# Patient Record
Sex: Male | Born: 1950
Health system: Southern US, Community
[De-identification: ages and names within clinical notes are randomized; demographics above are authoritative.]

## PROBLEM LIST (undated history)

## (undated) DIAGNOSIS — Q213 Tetralogy of Fallot: Secondary | ICD-10-CM

## (undated) DIAGNOSIS — M1990 Unspecified osteoarthritis, unspecified site: Secondary | ICD-10-CM

## (undated) DIAGNOSIS — K219 Gastro-esophageal reflux disease without esophagitis: Secondary | ICD-10-CM

## (undated) DIAGNOSIS — I10 Essential (primary) hypertension: Secondary | ICD-10-CM

## (undated) DIAGNOSIS — Z5189 Encounter for other specified aftercare: Secondary | ICD-10-CM

## (undated) DIAGNOSIS — Z136 Encounter for screening for cardiovascular disorders: Secondary | ICD-10-CM

## (undated) DIAGNOSIS — R011 Cardiac murmur, unspecified: Secondary | ICD-10-CM

## (undated) DIAGNOSIS — E785 Hyperlipidemia, unspecified: Secondary | ICD-10-CM

## (undated) DIAGNOSIS — K429 Umbilical hernia without obstruction or gangrene: Secondary | ICD-10-CM

## (undated) DIAGNOSIS — J449 Chronic obstructive pulmonary disease, unspecified: Secondary | ICD-10-CM

## (undated) DIAGNOSIS — E7800 Pure hypercholesterolemia, unspecified: Secondary | ICD-10-CM

## (undated) DIAGNOSIS — H269 Unspecified cataract: Secondary | ICD-10-CM

## (undated) DIAGNOSIS — N529 Male erectile dysfunction, unspecified: Secondary | ICD-10-CM

## (undated) DIAGNOSIS — J441 Chronic obstructive pulmonary disease with (acute) exacerbation: Secondary | ICD-10-CM

## (undated) DIAGNOSIS — G4730 Sleep apnea, unspecified: Secondary | ICD-10-CM

## (undated) DIAGNOSIS — T7840XA Allergy, unspecified, initial encounter: Secondary | ICD-10-CM

## (undated) DIAGNOSIS — K4090 Unilateral inguinal hernia, without obstruction or gangrene, not specified as recurrent: Secondary | ICD-10-CM

## (undated) DIAGNOSIS — R002 Palpitations: Secondary | ICD-10-CM

## (undated) DIAGNOSIS — I712 Thoracic aortic aneurysm, without rupture, unspecified: Secondary | ICD-10-CM

## (undated) DIAGNOSIS — G4733 Obstructive sleep apnea (adult) (pediatric): Secondary | ICD-10-CM

## (undated) DIAGNOSIS — I714 Abdominal aortic aneurysm, without rupture: Secondary | ICD-10-CM

## (undated) HISTORY — DX: Male erectile dysfunction, unspecified: N52.9

## (undated) HISTORY — DX: Essential (primary) hypertension: I10

## (undated) HISTORY — PX: TONSILLECTOMY: SUR1361

## (undated) HISTORY — DX: Encounter for other specified aftercare: Z51.89

## (undated) HISTORY — DX: Allergy, unspecified, initial encounter: T78.40XA

## (undated) HISTORY — DX: Unspecified cataract: H26.9

## (undated) HISTORY — DX: Obstructive sleep apnea (adult) (pediatric): G47.33

## (undated) HISTORY — PX: CARDIAC CATHETERIZATION: SHX172

## (undated) HISTORY — DX: Hyperlipidemia, unspecified: E78.5

## (undated) HISTORY — PX: BRAIN SURGERY: SHX531

## (undated) HISTORY — DX: Palpitations: R00.2

## (undated) HISTORY — PX: JOINT REPLACEMENT: SHX530

## (undated) HISTORY — DX: Unilateral inguinal hernia, without obstruction or gangrene, not specified as recurrent: K40.90

## (undated) HISTORY — DX: Sleep apnea, unspecified: G47.30

## (undated) HISTORY — DX: Gastro-esophageal reflux disease without esophagitis: K21.9

## (undated) HISTORY — PX: EYE SURGERY: SHX253

## (undated) HISTORY — PX: HERNIA REPAIR: SHX51

## (undated) HISTORY — DX: Chronic obstructive pulmonary disease with (acute) exacerbation: J44.1

## (undated) HISTORY — DX: Encounter for screening for cardiovascular disorders: Z13.6

## (undated) HISTORY — DX: Umbilical hernia without obstruction or gangrene: K42.9

---

## 1972-02-07 HISTORY — PX: TETRALOGY OF FALLOT REPAIR: SHX796

## 2003-02-07 DIAGNOSIS — Z136 Encounter for screening for cardiovascular disorders: Secondary | ICD-10-CM

## 2003-02-07 HISTORY — DX: Encounter for screening for cardiovascular disorders: Z13.6

## 2006-02-12 ENCOUNTER — Ambulatory Visit: Payer: Self-pay | Admitting: Unknown Physician Specialty

## 2009-07-20 ENCOUNTER — Ambulatory Visit: Payer: Self-pay | Admitting: Specialist

## 2010-01-10 ENCOUNTER — Ambulatory Visit: Payer: Self-pay | Admitting: Unknown Physician Specialty

## 2010-01-13 LAB — PATHOLOGY REPORT

## 2010-02-06 DIAGNOSIS — G4733 Obstructive sleep apnea (adult) (pediatric): Secondary | ICD-10-CM

## 2010-02-06 HISTORY — DX: Obstructive sleep apnea (adult) (pediatric): G47.33

## 2011-05-26 ENCOUNTER — Ambulatory Visit (INDEPENDENT_AMBULATORY_CARE_PROVIDER_SITE_OTHER)
Admission: RE | Admit: 2011-05-26 | Discharge: 2011-05-26 | Disposition: A | Discharge status: Home / Self Care | Payer: 59 | Source: Ambulatory Visit | Attending: Internal Medicine | Admitting: Internal Medicine

## 2011-05-26 ENCOUNTER — Ambulatory Visit (INDEPENDENT_AMBULATORY_CARE_PROVIDER_SITE_OTHER): Payer: 59 | Admitting: Internal Medicine

## 2011-05-26 ENCOUNTER — Encounter: Payer: Self-pay | Admitting: Internal Medicine

## 2011-05-26 DIAGNOSIS — M25519 Pain in unspecified shoulder: Secondary | ICD-10-CM

## 2011-05-26 DIAGNOSIS — G4733 Obstructive sleep apnea (adult) (pediatric): Secondary | ICD-10-CM | POA: Insufficient documentation

## 2011-05-26 DIAGNOSIS — M25512 Pain in left shoulder: Secondary | ICD-10-CM

## 2011-05-26 DIAGNOSIS — J449 Chronic obstructive pulmonary disease, unspecified: Secondary | ICD-10-CM | POA: Insufficient documentation

## 2011-05-26 DIAGNOSIS — E785 Hyperlipidemia, unspecified: Secondary | ICD-10-CM

## 2011-05-26 DIAGNOSIS — G8929 Other chronic pain: Secondary | ICD-10-CM | POA: Insufficient documentation

## 2011-05-26 DIAGNOSIS — K219 Gastro-esophageal reflux disease without esophagitis: Secondary | ICD-10-CM | POA: Insufficient documentation

## 2011-05-26 DIAGNOSIS — J45909 Unspecified asthma, uncomplicated: Secondary | ICD-10-CM

## 2011-05-26 DIAGNOSIS — Z136 Encounter for screening for cardiovascular disorders: Secondary | ICD-10-CM | POA: Insufficient documentation

## 2011-05-26 MED ORDER — TRAMADOL HCL 50 MG PO TABS: 50.0000 mg | ORAL_TABLET | Freq: Three times a day (TID) | ORAL | Status: AC | PRN

## 2011-05-26 MED ORDER — LEVALBUTEROL HCL 0.63 MG/3ML IN NEBU: 1.0000 | INHALATION_SOLUTION | RESPIRATORY_TRACT | Status: DC | PRN

## 2011-05-26 MED ORDER — ALBUTEROL SULFATE (2.5 MG/3ML) 0.083% IN NEBU: 2.5000 mg | INHALATION_SOLUTION | Freq: Four times a day (QID) | RESPIRATORY_TRACT | Status: DC | PRN

## 2011-05-26 NOTE — Patient Instructions (Signed)
I am ordering plain films of cervicla spine and left shoulder to rule out disk disease and spur formatio as causes of your pain .  You may combine tramadol with ibuprofen for pain relief.

## 2011-05-26 NOTE — Progress Notes (Signed)
Patient ID: Charles Mccann, male   DOB: Nov 15, 1950, 61 y.o.   MRN: [id number]  Patient Active Problem List  Diagnoses  . Asthma  . Treadmill stress test negative for angina pectoris  . GERD (gastroesophageal reflux disease)  . Obstructive sleep apnea of adult  . Chronic left shoulder pain  . Hyperlipidemia LDL goal < 100    Subjective:  CC:   Chief Complaint  Patient presents with  . New Patient    HPI:   Charles Mccann a 61 y.o. male who presents to establish primary care,  Transferring from [facility].  He has brought his recent communication from [facility] regarding his fasting lipids in February which were normal.  Not sure when his last anunual was done but PSA was low. He has a history of Tetralaogy of Fallot, which was repaired at age 36 by Rachel Bo,  Cardiothoracic surgeon who was previously at [facility].  He has not had any cardiology since then.  He has a history of asthma.  His chief complaint today is left shoulder pain which radiates to his left hand.  It has been present for one year. Aggravated by internal rotation and abduction above his head.  No history of trauma .     Past Medical History  Diagnosis Date  . Asthma     managed by Meredeth Ide  . Treadmill stress test negative for angina pectoris 2005  . GERD (gastroesophageal reflux disease)     controlled only with nexium priro prevacid failutre  . Hypertension   . Obstructive sleep apnea of adult 2012    on CPAP  tolerating ,  12 cm H20 , room air     History reviewed. No pertinent past surgical history.       The following portions of the patient's history were reviewed and updated as appropriate: Allergies, current medications, and problem list.    Review of Systems:   12 Pt  review of systems was negative except those addressed in the HPI,     History   Social History  . Marital Status: Married    Spouse Name: N/A    Number of Children: N/A  . Years of Education: N/A   Occupational  History  . 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 Smoking status: Never Smoker   . Smokeless tobacco: Never Used  . Alcohol Use: Yes  . Drug Use: No  . Sexually Active: Not on file   Other Topics Concern  . Not on file   Social History Narrative  . No narrative on file    Objective:  BP 136/84  Pulse 65  Temp(Src) 98.2 F (36.8 C) (Oral)  Resp 16  Ht 5\' 11"  (1.803 m)  Wt 219 lb 4 oz (99.451 kg)  BMI 30.58 kg/m2  SpO2 96%  General appearance: alert, cooperative and appears stated age Ears: normal TM's and external ear canals both ears Throat: lips, mucosa, and tongue normal; teeth and gums normal Neck: no adenopathy, no carotid bruit, supple, symmetrical, trachea midline and thyroid not enlarged, symmetric, no tenderness/mass/nodules Back: symmetric, no curvature. ROM normal. No CVA tenderness. Lungs: clear to auscultation bilaterally Heart: regular rate and rhythm, S1, S2 normal, no murmur, click, rub or gallop Abdomen: soft, non-tender; bowel sounds normal; no masses,  no organomegaly Pulses: 2+ and symmetric Skin: Skin color, texture, turgor normal. No rashes or lesions Lymph nodes: Cervical, supraclavicular, and axillary nodes  normal.  Assessment and Plan:  Chronic left shoulder pain likely rotator cuff or bone spur. Plain films  Ordered.   GERD (gastroesophageal reflux disease) controlled only with nexium prior prevacid failure  Hyperlipidemia LDL goal < 100 Managed with low dose lipitor,  LDL recently was 86.     Updated Medication List Outpatient Encounter Prescriptions as of 05/26/2011  Medication Sig Dispense Refill  . albuterol (PROVENTIL HFA;VENTOLIN HFA) 108 (90 BASE) MCG/ACT inhaler Inhale 2 puffs into the lungs every 6 (six) hours as needed.      Marland Kitchen albuterol (PROVENTIL) (2.5 MG/3ML) 0.083% nebulizer solution Take 3 mLs (2.5 mg total) by nebulization every 6 (six) hours as needed for wheezing.  75 mL  12  .  atorvastatin (LIPITOR) 10 MG tablet Take 10 mg by mouth daily.      Marland Kitchen esomeprazole (NEXIUM) 40 MG capsule Take 40 mg by mouth daily before breakfast.      . fluticasone (FLONASE) 50 MCG/ACT nasal spray Place 2 sprays into the nose daily.      . Fluticasone-Salmeterol (ADVAIR) 250-50 MCG/DOSE AEPB Inhale 1 puff into the lungs every 12 (twelve) hours.      Marland Kitchen levalbuterol (XOPENEX) 0.63 MG/3ML nebulizer solution Take 3 mLs (0.63 mg total) by nebulization every 4 (four) hours as needed for wheezing.  3 mL  12  . montelukast (SINGULAIR) 10 MG tablet Take 10 mg by mouth at bedtime.      . sildenafil (VIAGRA) 100 MG tablet Take 100 mg by mouth daily as needed.      . traMADol (ULTRAM) 50 MG tablet Take 1 tablet (50 mg total) by mouth every 8 (eight) hours as needed for pain.  120 tablet  0     Orders Placed This Encounter  Procedures  . DG Cervical Spine Complete  . DG Shoulder Left  . HM COLONOSCOPY    Return in about 6 months (around 11/25/2011).

## 2011-05-26 NOTE — Assessment & Plan Note (Addendum)
likely rotator cuff or bone spur. Plain films  Ordered.

## 2011-05-28 ENCOUNTER — Encounter: Payer: Self-pay | Admitting: Internal Medicine

## 2011-05-28 DIAGNOSIS — E785 Hyperlipidemia, unspecified: Secondary | ICD-10-CM

## 2011-05-28 HISTORY — DX: Hyperlipidemia, unspecified: E78.5

## 2011-05-28 NOTE — Assessment & Plan Note (Signed)
Managed with low dose lipitor,  LDL recently was 86.

## 2011-05-28 NOTE — Assessment & Plan Note (Signed)
controlled only with nexium prior prevacid failure

## 2011-05-30 ENCOUNTER — Encounter: Payer: Self-pay | Admitting: *Deleted

## 2011-07-14 ENCOUNTER — Other Ambulatory Visit: Payer: Self-pay | Admitting: *Deleted

## 2011-07-14 ENCOUNTER — Encounter: Payer: Self-pay | Admitting: Internal Medicine

## 2011-07-14 MED ORDER — SILDENAFIL CITRATE 100 MG PO TABS: 100.0000 mg | ORAL_TABLET | Freq: Every day | ORAL | Status: DC | PRN

## 2011-08-31 ENCOUNTER — Telehealth: Payer: Self-pay | Admitting: Internal Medicine

## 2011-08-31 MED ORDER — PREDNISONE (PAK) 10 MG PO TABS: ORAL_TABLET | ORAL | Status: DC

## 2011-08-31 MED ORDER — LEVOFLOXACIN 500 MG PO TABS: 500.0000 mg | ORAL_TABLET | Freq: Every day | ORAL | Status: AC

## 2011-08-31 NOTE — Telephone Encounter (Signed)
Patient called and stated he is an asthma patient and normally when he has a sinus infection Dr. Meredeth Ide will call in an antibiotic and prednisone but Dr. Meredeth Ide is out of town.  He stated he is congested, productive cough x 10 days no fever.  Per Dr. Darrick Huntsman a prednisone taper and antibiotic have been called.

## 2011-10-18 ENCOUNTER — Encounter: Payer: Self-pay | Admitting: Internal Medicine

## 2011-10-20 ENCOUNTER — Ambulatory Visit: Payer: 59 | Admitting: Internal Medicine

## 2011-11-27 ENCOUNTER — Encounter: Payer: Self-pay | Admitting: Internal Medicine

## 2011-11-27 ENCOUNTER — Ambulatory Visit (INDEPENDENT_AMBULATORY_CARE_PROVIDER_SITE_OTHER): Payer: 59 | Admitting: Internal Medicine

## 2011-11-27 VITALS — BP 122/78 | HR 62 | Temp 98.2°F | Ht 71.0 in | Wt 219.5 lb

## 2011-11-27 DIAGNOSIS — I493 Ventricular premature depolarization: Secondary | ICD-10-CM

## 2011-11-27 DIAGNOSIS — Z23 Encounter for immunization: Secondary | ICD-10-CM

## 2011-11-27 DIAGNOSIS — R002 Palpitations: Secondary | ICD-10-CM

## 2011-11-27 DIAGNOSIS — T148XXA Other injury of unspecified body region, initial encounter: Secondary | ICD-10-CM | POA: Insufficient documentation

## 2011-11-27 DIAGNOSIS — Z13220 Encounter for screening for lipoid disorders: Secondary | ICD-10-CM

## 2011-11-27 DIAGNOSIS — M180 Bilateral primary osteoarthritis of first carpometacarpal joints: Secondary | ICD-10-CM

## 2011-11-27 DIAGNOSIS — R5381 Other malaise: Secondary | ICD-10-CM

## 2011-11-27 DIAGNOSIS — R5383 Other fatigue: Secondary | ICD-10-CM

## 2011-11-27 DIAGNOSIS — I4949 Other premature depolarization: Secondary | ICD-10-CM

## 2011-11-27 DIAGNOSIS — M19049 Primary osteoarthritis, unspecified hand: Secondary | ICD-10-CM

## 2011-11-27 DIAGNOSIS — Z79899 Other long term (current) drug therapy: Secondary | ICD-10-CM

## 2011-11-27 HISTORY — DX: Palpitations: R00.2

## 2011-11-27 LAB — LDL CHOLESTEROL, DIRECT: Direct LDL: 73.6 mg/dL

## 2011-11-27 LAB — LIPID PANEL
Cholesterol: 133 mg/dL (ref 0–200)
HDL: 42.6 mg/dL (ref >39.00)
LDL Cholesterol: 81 mg/dL (ref 0–99)
VLDL: 9.8 mg/dL (ref 0.0–40.0)

## 2011-11-27 LAB — COMPREHENSIVE METABOLIC PANEL
ALT: 21 U/L (ref 0–53)
AST: 18 U/L (ref 0–37)
Albumin: 3.9 g/dL (ref 3.5–5.2)
BUN: 20 mg/dL (ref 6–23)
CO2: 29 mEq/L (ref 19–32)
Calcium: 8.7 mg/dL (ref 8.4–10.5)
Chloride: 106 mEq/L (ref 96–112)
Potassium: 4 mEq/L (ref 3.5–5.1)

## 2011-11-27 MED ORDER — ZOSTER VACCINE LIVE 19400 UNT/0.65ML ~~LOC~~ SOLR: 0.6500 mL | Freq: Once | SUBCUTANEOUS | Status: DC

## 2011-11-27 NOTE — Patient Instructions (Addendum)
You can take up to 4 tylenol ( 2000 mg total) safely for wrist/thumb pain   Ok to combine it with alleve for improved pain management.   Keep your leg scab covered to see if it will heal.    Return in 6 months for your annual physical

## 2011-11-27 NOTE — Assessment & Plan Note (Signed)
No muscle wasting seen,  No synovitis or loss of strength.  Conservative treatment.

## 2011-11-27 NOTE — Assessment & Plan Note (Signed)
Prolonged auscultation reveals PVC's.  Checking lytes, thyroid ,  reassurance provided,  limitation of caffeine

## 2011-11-27 NOTE — Assessment & Plan Note (Signed)
Appears to have an acquired right buttock deformity secondary to contusion followed by necrosis.

## 2011-11-27 NOTE — Progress Notes (Signed)
Patient ID: Charles Mccann, male   DOB: Nov 22, 1950, 61 y.o.   MRN: [id number]  Patient Active Problem List  Diagnosis  . Asthma  . Treadmill stress test negative for angina pectoris  . GERD (gastroesophageal reflux disease)  . Obstructive sleep apnea of adult  . Hyperlipidemia LDL goal < 100  . Primary osteoarthritis of both first carpometacarpal joints  . Muscle contusion    Subjective:  CC:   Chief Complaint  Patient presents with  . Follow-up    HPI:  Charles Mccann a 61 y.o. male who presents for follow up on acute and chronic complaints.  Several new complaints.  1) His shoulder pain has improved with glucosamine. Having difficulty picking picking things up.  3) episodes of skipped heartbeats occurring several times per month.  Denies concurrent dizziness or syncopal feeling.   Risk factors include OSA managed with nightly use of CPAP.   He does not smoke .  Drinks a cup  Of highly caffeinated coffee every afternoon at 2 Pm.  Last episode occurred a few days ago.  3) Wrist pain. arthritis pains: now aching in the wrist bilaterally.  4) fell backward in a boat several weeks ago and bruised his right buttock when he feel against the metal seat pm on Labor Day.  His pain has resolved but he now has a lump in his the buttock.    Past Medical History  Diagnosis Date  . Asthma     managed by Meredeth Ide  . Treadmill stress test negative for angina pectoris 2005  . GERD (gastroesophageal reflux disease)     controlled only with nexium priro prevacid failutre  . Hypertension   . Obstructive sleep apnea of adult 2012    on CPAP  tolerating ,  12 cm H20 , room air     History reviewed. No pertinent past surgical history.       The following portions of the patient's history were reviewed and updated as appropriate: Allergies, current medications, and problem list.    Review of Systems:   12 Pt  review of systems was negative except those addressed in the HPI,     History    Social History  . Marital Status: Married    Spouse Name: N/A    Number of Children: N/A  . Years of Education: N/A   Occupational History  . 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 Smoking status: Never Smoker   . Smokeless tobacco: Never Used  . Alcohol Use: Yes  . Drug Use: No  . Sexually Active: Not on file   Other Topics Concern  . Not on file   Social History Narrative  . No narrative on file    Objective:  BP 122/78  Pulse 62  Temp 98.2 F (36.8 C) (Oral)  Ht 5\' 11"  (1.803 m)  Wt 219 lb 8 oz (99.565 kg)  BMI 30.61 kg/m2  SpO2 97%  General appearance: alert, cooperative and appears stated age Ears: normal TM's and external ear canals both ears Throat: lips, mucosa, and tongue normal; teeth and gums normal Neck: no adenopathy, no carotid bruit, supple, symmetrical, trachea midline and thyroid not enlarged, symmetric, no tenderness/mass/nodules Back: symmetric, no curvature. ROM normal. No CVA tenderness. Lungs: clear to auscultation bilaterally Heart: regular rate and rhythm, S1, S2 normal, no murmur, click, rub or gallop Abdomen: soft, non-tender; bowel sounds normal; no masses,  no organomegaly Pulses:  2+ and symmetric Skin: Skin color, texture, turgor normal. No rashes or lesions Lymph nodes: Cervical, supraclavicular, and axillary nodes normal.  Assessment and Plan:  Primary osteoarthritis of both first carpometacarpal joints No muscle wasting seen,  No synovitis or loss of strength.  Conservative treatment.   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   Palpitations Prolonged auscultation reveals PVC's.  Checking lytes, thyroid ,  reassurance provided,  limitation of caffeine    Updated Medication List Outpatient Encounter Prescriptions as of 11/27/2011  Medication Sig Dispense Refill  . albuterol (PROVENTIL HFA;VENTOLIN HFA) 108 (90  BASE) MCG/ACT inhaler Inhale 2 puffs into the lungs every 6 (six) hours as needed.      Marland Kitchen albuterol (PROVENTIL) (2.5 MG/3ML) 0.083% nebulizer solution Take 3 mLs (2.5 mg total) by nebulization every 6 (six) hours as needed for wheezing.  75 mL  12  . atorvastatin (LIPITOR) 10 MG tablet Take 10 mg by mouth daily.      Marland Kitchen esomeprazole (NEXIUM) 40 MG capsule Take 40 mg by mouth daily before breakfast.      . fluticasone (FLONASE) 50 MCG/ACT nasal spray Place 2 sprays into the nose daily.      . Fluticasone-Salmeterol (ADVAIR) 250-50 MCG/DOSE AEPB Inhale 1 puff into the lungs every 12 (twelve) hours.      Marland Kitchen levalbuterol (XOPENEX) 0.63 MG/3ML nebulizer solution Take 3 mLs (0.63 mg total) by nebulization every 4 (four) hours as needed for wheezing.  3 mL  12  . montelukast (SINGULAIR) 10 MG tablet Take 10 mg by mouth at bedtime.      . predniSONE (STERAPRED UNI-PAK) 10 MG tablet Take 6 tablets on day 1, decrease daily until gone.  21 tablet  0  . sildenafil (VIAGRA) 100 MG tablet Take 1 tablet (100 mg total) by mouth daily as needed.  90 tablet  1  . zoster vaccine live, PF, (ZOSTAVAX) 16109 UNT/0.65ML injection Inject 19,400 Units into the skin once.  1 vial  0

## 2012-02-19 ENCOUNTER — Ambulatory Visit (INDEPENDENT_AMBULATORY_CARE_PROVIDER_SITE_OTHER): Payer: 59 | Admitting: Internal Medicine

## 2012-02-19 ENCOUNTER — Encounter: Payer: Self-pay | Admitting: Internal Medicine

## 2012-02-19 VITALS — BP 120/80 | HR 63 | Temp 97.9°F | Resp 16 | Ht 72.0 in | Wt 224.5 lb

## 2012-02-19 DIAGNOSIS — R002 Palpitations: Secondary | ICD-10-CM

## 2012-02-19 DIAGNOSIS — M180 Bilateral primary osteoarthritis of first carpometacarpal joints: Secondary | ICD-10-CM

## 2012-02-19 DIAGNOSIS — Z1331 Encounter for screening for depression: Secondary | ICD-10-CM

## 2012-02-19 DIAGNOSIS — Z0001 Encounter for general adult medical examination with abnormal findings: Secondary | ICD-10-CM | POA: Insufficient documentation

## 2012-02-19 DIAGNOSIS — G4733 Obstructive sleep apnea (adult) (pediatric): Secondary | ICD-10-CM

## 2012-02-19 DIAGNOSIS — M19049 Primary osteoarthritis, unspecified hand: Secondary | ICD-10-CM

## 2012-02-19 DIAGNOSIS — M79609 Pain in unspecified limb: Secondary | ICD-10-CM

## 2012-02-19 DIAGNOSIS — M25549 Pain in joints of unspecified hand: Secondary | ICD-10-CM

## 2012-02-19 DIAGNOSIS — J45901 Unspecified asthma with (acute) exacerbation: Secondary | ICD-10-CM

## 2012-02-19 DIAGNOSIS — Z0000 Encounter for general adult medical examination without abnormal findings: Secondary | ICD-10-CM

## 2012-02-19 LAB — BASIC METABOLIC PANEL
BUN: 18 mg/dL (ref 6–23)
Calcium: 9.2 mg/dL (ref 8.4–10.5)
Creatinine, Ser: 1 mg/dL (ref 0.4–1.5)
GFR: 85.78 mL/min (ref >60.00)

## 2012-02-19 MED ORDER — PREDNISONE (PAK) 10 MG PO TABS: ORAL_TABLET | ORAL | Status: DC

## 2012-02-19 NOTE — Assessment & Plan Note (Signed)
Historically sounding like PVCs. History of normal stress test.  Normal exam,  checking lites and TSH

## 2012-02-19 NOTE — Assessment & Plan Note (Signed)
Testicular, prostate exams normal

## 2012-02-19 NOTE — Assessment & Plan Note (Signed)
Managed with daily use of CPAP with good tolerance .

## 2012-02-19 NOTE — Assessment & Plan Note (Signed)
With persistent nonproductive cough .  Repeat prednisone taper

## 2012-02-19 NOTE — Progress Notes (Addendum)
Patient ID: Charles Mccann, male   DOB: 05/22/1950, 62 y.o.   MRN: [id number]  Patient Active Problem List  Diagnosis  . Asthma exacerbation  . Treadmill stress test negative for angina pectoris  . GERD (gastroesophageal reflux disease)  . Obstructive sleep apnea of adult  . Hyperlipidemia LDL goal < 100  . Primary osteoarthritis of both first carpometacarpal joints  . Muscle contusion  . Palpitations  . Routine general medical examination at a health care facility    Subjective:  CC:   Chief Complaint  Patient presents with  . Annual Exam    HPI:   Charles Mccann a 62 y.o. male who presents for his Annual exam.  Had an episode of bronchitis at Thanksgiving treated with prednisone and antibiotic.  Cough not resolved but is now nonproductive.  Not  Using albuterol inhaler. No nocturnal cough. He has OSA and is  sleeping through night on CPAP .  Able to sleep in various positions.l  Original study was 2011.  His sleep quality is good, no nocturnal voids. Less hypersomnolence in th evening since CPAP was prescribed.    Thumb joint pain  Bilateral,  Left shoulder problem managed with glucosamine.  No gout history, no prior x rays.  Not relieved with 2 alleve daily.   Nocturnal palpitations occurring two or three times a week, lasting 15 to 20 minutes     Past Medical History  Diagnosis Date  . Asthma     managed by Meredeth Ide  . Treadmill stress test negative for angina pectoris 2005  . GERD (gastroesophageal reflux disease)     controlled only with nexium priro prevacid failutre  . Hypertension   . Obstructive sleep apnea of adult 2012    on CPAP  tolerating ,  12 cm H20 , room air     History reviewed. No pertinent past surgical history.   The following portions of the patient's history were reviewed and updated as appropriate: Allergies, current medications, and problem list.    Review of Systems:   Patient denies headache, fevers, malaise, unintentional weight loss, skin  rash, eye pain, sinus congestion and sinus pain, sore throat, dysphagia,  hemoptysis , cough, dyspnea, wheezing, chest pain, palpitations, orthopnea, edema, abdominal pain, nausea, melena, diarrhea, constipation, flank pain, dysuria, hematuria, urinary  Frequency, nocturia, numbness, tingling, seizures,  Focal weakness, Loss of consciousness,  Tremor, insomnia, depression, anxiety, and suicidal ideation.        History   Social History  . Marital Status: Married    Spouse Name: N/A    Number of Children: N/A  . Years of Education: N/A   Occupational History  . 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 Smoking status: Never Smoker   . Smokeless tobacco: Never Used  . Alcohol Use: Yes  . Drug Use: No  . Sexually Active: Not on file   Other Topics Concern  . Not on file   Social History Narrative  . No narrative on file    Objective:  BP 120/80  Pulse 63  Temp 97.9 F (36.6 C) (Oral)  Resp 16  Ht 6' (1.829 m)  Wt 224 lb 8 oz (101.833 kg)  BMI 30.45 kg/m2  SpO2 97%  General Appearance:    Alert, cooperative, no distress, appears stated age  Head:    Normocephalic, without obvious abnormality, atraumatic  Eyes:    PERRL, conjunctiva/corneas clear, EOM's intact, fundi  benign, both eyes       Ears:    Normal TM's and external ear canals, both ears  Nose:   Nares normal, septum midline, mucosa normal, no drainage   or sinus tenderness  Throat:   Lips, mucosa, and tongue normal; teeth and gums normal  Neck:   Supple, symmetrical, trachea midline, no adenopathy;       thyroid:  No enlargement/tenderness/nodules; no carotid   bruit or JVD  Back:     Symmetric, no curvature, ROM normal, no CVA tenderness  Lungs:     Clear to auscultation bilaterally, respirations unlabored  Chest wall:    No tenderness or deformity  Heart:    Regular rate and rhythm, S1 and S2 normal, no murmur, rub   or gallop  Abdomen:     Soft, non-tender,  bowel sounds active all four quadrants,    no masses, no organomegaly  Genitalia:    Normal male without lesion, discharge or tenderness  Rectal:    Normal tone, normal prostate, no masses or tenderness;   guaiac negative stool  Extremities:   Extremities normal, atraumatic, no cyanosis or edema  Pulses:   2+ and symmetric all extremities  Skin:   Skin color, texture, turgor normal, no rashes or lesions  Lymph nodes:   Cervical, supraclavicular, and axillary nodes normal  Neurologic:   CNII-XII intact. Normal strength, sensation and reflexes      throughout    Assessment and Plan:  Asthma exacerbation With persistent nonproductive cough .  Repeat prednisone taper   Primary osteoarthritis of both first carpometacarpal joints Plain films ordered to evaluate joint space. Discussed referral to Rheumatology  Obstructive sleep apnea of adult Managed with daily use of CPAP with good tolerance .   Palpitations Historically sounding like PVCs. History of normal stress test.  Normal exam,  checking lites and TSH  Routine general medical examination at a health care facility Testicular, prostate exams normal     Updated Medication List Outpatient Encounter Prescriptions as of 02/19/2012  Medication Sig Dispense Refill  . albuterol (PROVENTIL HFA;VENTOLIN HFA) 108 (90 BASE) MCG/ACT inhaler Inhale 2 puffs into the lungs every 6 (six) hours as needed.      Marland Kitchen albuterol (PROVENTIL) (2.5 MG/3ML) 0.083% nebulizer solution Take 3 mLs (2.5 mg total) by nebulization every 6 (six) hours as needed for wheezing.  75 mL  12  . atorvastatin (LIPITOR) 10 MG tablet Take 10 mg by mouth daily.      Marland Kitchen esomeprazole (NEXIUM) 40 MG capsule Take 40 mg by mouth daily before breakfast.      . fluticasone (FLONASE) 50 MCG/ACT nasal spray Place 2 sprays into the nose daily.      . Fluticasone-Salmeterol (ADVAIR) 250-50 MCG/DOSE AEPB Inhale 1 puff into the lungs every 12 (twelve) hours.      Marland Kitchen levalbuterol (XOPENEX)  0.63 MG/3ML nebulizer solution Take 3 mLs (0.63 mg total) by nebulization every 4 (four) hours as needed for wheezing.  3 mL  12  . montelukast (SINGULAIR) 10 MG tablet Take 10 mg by mouth at bedtime.      . sildenafil (VIAGRA) 100 MG tablet Take 1 tablet (100 mg total) by mouth daily as needed.  90 tablet  1  . predniSONE (STERAPRED UNI-PAK) 10 MG tablet Take 6 tablets on day 1, decrease daily until gone.  21 tablet  0  . predniSONE (STERAPRED UNI-PAK) 10 MG tablet 6 tablets on Day 1 , then reduce by 1 tablet daily  until gone  21 tablet  0  . zoster vaccine live, PF, (ZOSTAVAX) 78295 UNT/0.65ML injection Inject 19,400 Units into the skin once.  1 vial  0     Orders Placed This Encounter  Procedures  . DG Finger Thumb Right  . DG Finger Thumb Left  . PSA  . TSH  . Magnesium  . Basic metabolic panel    No Follow-up on file.

## 2012-02-19 NOTE — Patient Instructions (Addendum)
Return in April for fasting labs   If the palpitations return please let me known and I will call you in an rx for propranolol

## 2012-02-19 NOTE — Assessment & Plan Note (Signed)
Plain films ordered to evaluate joint space. Discussed referral to Rheumatology

## 2012-04-23 LAB — HM COLONOSCOPY

## 2012-06-12 ENCOUNTER — Other Ambulatory Visit: Payer: Self-pay | Admitting: *Deleted

## 2012-06-12 DIAGNOSIS — J45909 Unspecified asthma, uncomplicated: Secondary | ICD-10-CM

## 2012-06-12 MED ORDER — LEVALBUTEROL HCL 0.63 MG/3ML IN NEBU: 1.0000 | INHALATION_SOLUTION | RESPIRATORY_TRACT | Status: DC | PRN

## 2012-06-12 NOTE — Telephone Encounter (Signed)
Rx sent to pharmacy by escript  

## 2012-11-02 ENCOUNTER — Other Ambulatory Visit: Payer: Self-pay | Admitting: Internal Medicine

## 2012-11-08 ENCOUNTER — Telehealth: Payer: Self-pay | Admitting: Internal Medicine

## 2012-11-08 NOTE — Telephone Encounter (Signed)
Patient can only be scheduled for TWO shot at one visit.

## 2012-11-08 NOTE — Telephone Encounter (Signed)
Pt sent below my chart message is it ok to schedule all three shots on same day

## 2013-01-06 ENCOUNTER — Ambulatory Visit: Payer: 59 | Admitting: Internal Medicine

## 2013-01-09 ENCOUNTER — Encounter: Payer: Self-pay | Admitting: Internal Medicine

## 2013-01-09 ENCOUNTER — Ambulatory Visit (INDEPENDENT_AMBULATORY_CARE_PROVIDER_SITE_OTHER): Payer: 59 | Admitting: Internal Medicine

## 2013-01-09 VITALS — BP 144/80 | HR 67 | Temp 98.4°F | Resp 12 | Ht 72.0 in | Wt 217.5 lb

## 2013-01-09 DIAGNOSIS — Z79899 Other long term (current) drug therapy: Secondary | ICD-10-CM

## 2013-01-09 DIAGNOSIS — J45901 Unspecified asthma with (acute) exacerbation: Secondary | ICD-10-CM

## 2013-01-09 DIAGNOSIS — G4733 Obstructive sleep apnea (adult) (pediatric): Secondary | ICD-10-CM

## 2013-01-09 DIAGNOSIS — Z23 Encounter for immunization: Secondary | ICD-10-CM

## 2013-01-09 DIAGNOSIS — E785 Hyperlipidemia, unspecified: Secondary | ICD-10-CM

## 2013-01-09 DIAGNOSIS — I10 Essential (primary) hypertension: Secondary | ICD-10-CM

## 2013-01-09 DIAGNOSIS — R030 Elevated blood-pressure reading, without diagnosis of hypertension: Secondary | ICD-10-CM

## 2013-01-09 DIAGNOSIS — M180 Bilateral primary osteoarthritis of first carpometacarpal joints: Secondary | ICD-10-CM

## 2013-01-09 DIAGNOSIS — M19049 Primary osteoarthritis, unspecified hand: Secondary | ICD-10-CM

## 2013-01-09 LAB — LIPID PANEL
Cholesterol: 146 mg/dL (ref 0–200)
HDL: 43.8 mg/dL (ref >39.00)
LDL Cholesterol: 92 mg/dL (ref 0–99)
Triglycerides: 49 mg/dL (ref 0.0–149.0)

## 2013-01-09 LAB — COMPREHENSIVE METABOLIC PANEL
ALT: 20 U/L (ref 0–53)
AST: 16 U/L (ref 0–37)
Alkaline Phosphatase: 49 U/L (ref 39–117)
Glucose, Bld: 95 mg/dL (ref 70–99)
Sodium: 140 mEq/L (ref 135–145)
Total Bilirubin: 1.1 mg/dL (ref 0.3–1.2)
Total Protein: 7 g/dL (ref 6.0–8.3)

## 2013-01-09 LAB — MICROALBUMIN / CREATININE URINE RATIO: Microalb, Ur: 0.4 mg/dL (ref 0.0–1.9)

## 2013-01-09 NOTE — Assessment & Plan Note (Addendum)
Using cpap averaging 6 hours per night.  Resting well .

## 2013-01-09 NOTE — Patient Instructions (Signed)
.  You received the TDaP vaccine today (good for 10 years)  Your blood pressure is very mildly elevated.  Please check it 3 or 4 times (away from a doctor's office) over the next 3 weeks and e mail me your numbers.  Please return after January for your annual exam.   Please take a probiotic ( Align, Floraque or Culturelle)for a minimum of 2 weeks if you start taking an  antibiotic to prevent a serious antibiotic associated diarrhea  Called clostridium dificile colitis

## 2013-01-09 NOTE — Progress Notes (Signed)
Patient ID: Charles Mccann, male   DOB: Jun 02, 1950, 62 y.o.   MRN: [id number]  Patient Active Problem List   Diagnosis Date Noted  . Elevated blood pressure reading without diagnosis of hypertension 01/11/2013  . Routine general medical examination at a health care facility 02/19/2012  . Primary osteoarthritis of both first carpometacarpal joints 11/27/2011  . Muscle contusion 11/27/2011  . Palpitations 11/27/2011  . Hyperlipidemia LDL goal < 100 05/28/2011  . Asthma exacerbation   . Treadmill stress test negative for angina pectoris   . GERD (gastroesophageal reflux disease)   . Obstructive sleep apnea of adult     Subjective:  CC:   Chief Complaint  Patient presents with  . Follow-up    follow up    HPI:   Charles Mccann a 62 y.o. male who presents Follow up on chronic conditions including hyperlipidemia, managed with atorvastatin, asthma, OSA,  And OA.  Sees Dr Meredeth Ide regularly  And his asthma has been well controlled.  He is exercising regularly and toleraitng his medications without side effects.    Past Medical History  Diagnosis Date  . Asthma     managed by Meredeth Ide  . Treadmill stress test negative for angina pectoris 2005  . GERD (gastroesophageal reflux disease)     controlled only with nexium priro prevacid failutre  . Hypertension   . Obstructive sleep apnea of adult 2012    on CPAP  tolerating ,  12 cm H20 , room air     No past surgical history on file.     The following portions of the patient's history were reviewed and updated as appropriate: Allergies, current medications, and problem list.    Review of Systems:   Patient denies headache, fevers, malaise, unintentional weight loss, skin rash, eye pain, sinus congestion and sinus pain, sore throat, dysphagia,  hemoptysis , cough, dyspnea, wheezing, chest pain, palpitations, orthopnea, edema, abdominal pain, nausea, melena, diarrhea, constipation, flank pain, dysuria, hematuria, urinary  Frequency,  nocturia, numbness, tingling, seizures,  Focal weakness, Loss of consciousness,  Tremor, insomnia, depression, anxiety, and suicidal ideation.    History   Social History  . Marital Status: Married    Spouse Name: N/A    Number of Children: N/A  . Years of Education: N/A   Occupational History  . 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 Smoking status: Never Smoker   . Smokeless tobacco: Never Used  . Alcohol Use: Yes  . Drug Use: No  . Sexual Activity: Not on file   Other Topics Concern  . Not on file   Social History Narrative  . No narrative on file    Objective:  Filed Vitals:   01/09/13 0802  BP: 144/80  Pulse: 67  Temp: 98.4 F (36.9 C)  Resp: 12     General appearance: alert, cooperative and appears stated age Neck: no adenopathy, no carotid bruit, supple, symmetrical, trachea midline and thyroid not enlarged, symmetric, no tenderness/mass/nodules Back: symmetric, no curvature. ROM normal. No CVA tenderness. Lungs: clear to auscultation bilaterally Heart: regular rate and rhythm, S1, S2 normal, no murmur, click, rub or gallop Abdomen: soft, non-tender; bowel sounds normal; no masses,  no organomegaly Pulses: 2+ and symmetric Skin: Skin color, texture, turgor normal. No rashes or lesions Lymph nodes: Cervical, supraclavicular, and axillary nodes normal.  Assessment and Plan:  Obstructive sleep apnea of adult Using cpap averaging 6 hours per night.  Resting well .    Hyperlipidemia LDL goal < 100 Well controlled on current statin therapy.   Liver enzymes are normal , no changes today.  Lab Results  Component Value Date   CHOL 146 01/09/2013   HDL 43.80 01/09/2013   LDLCALC 92 01/09/2013   LDLDIRECT 73.6 11/27/2011   TRIG 49.0 01/09/2013   CHOLHDL 3 01/09/2013   Lab Results  Component Value Date   ALT 20 01/09/2013   AST 16 01/09/2013   ALKPHOS 49 01/09/2013   BILITOT 1.1 01/09/2013     Chronic obstructive  asthma, unspecified Mild to moderate with no exacerbations in several months .  Continue symbicort and singulair.    Primary osteoarthritis of both first carpometacarpal joints he declined previous referral for imaging and rheumatologic evaluation.     Elevated blood pressure reading without diagnosis of hypertension He has no prior history of hypertension. He will check his blood pressure several times over the next 3-4 weeks and to submit readings for evaluation. Urine microalbumin to creatinine ratio done today is normal.   Updated Medication List Outpatient Encounter Prescriptions as of 01/09/2013  Medication Sig  . albuterol (PROVENTIL HFA;VENTOLIN HFA) 108 (90 BASE) MCG/ACT inhaler Inhale 2 puffs into the lungs every 6 (six) hours as needed.  Marland Kitchen atorvastatin (LIPITOR) 10 MG tablet Take 10 mg by mouth daily.  . budesonide-formoterol (SYMBICORT) 160-4.5 MCG/ACT inhaler Inhale 2 puffs into the lungs 2 (two) times daily.  Marland Kitchen esomeprazole (NEXIUM) 40 MG capsule Take 40 mg by mouth daily before breakfast.  . fluticasone (FLONASE) 50 MCG/ACT nasal spray Place 2 sprays into the nose daily.  Marland Kitchen levalbuterol (XOPENEX) 0.63 MG/3ML nebulizer solution Take 3 mLs (0.63 mg total) by nebulization every 4 (four) hours as needed for wheezing.  . montelukast (SINGULAIR) 10 MG tablet Take 10 mg by mouth at bedtime.  Marland Kitchen VIAGRA 100 MG tablet TAKE 1 TABLET DAILY AS NEEDED  . albuterol (PROVENTIL) (2.5 MG/3ML) 0.083% nebulizer solution Take 3 mLs (2.5 mg total) by nebulization every 6 (six) hours as needed for wheezing.  . zoster vaccine live, PF, (ZOSTAVAX) 16109 UNT/0.65ML injection Inject 19,400 Units into the skin once.  . [DISCONTINUED] Fluticasone-Salmeterol (ADVAIR) 250-50 MCG/DOSE AEPB Inhale 1 puff into the lungs every 12 (twelve) hours.  . [DISCONTINUED] predniSONE (STERAPRED UNI-Mccann) 10 MG tablet Take 6 tablets on day 1, decrease daily until gone.  . [DISCONTINUED] predniSONE (STERAPRED UNI-Mccann) 10 MG  tablet 6 tablets on Day 1 , then reduce by 1 tablet daily until gone

## 2013-01-09 NOTE — Progress Notes (Signed)
Pre-visit discussion using our clinic review tool. No additional management support is needed unless otherwise documented below in the visit note.  

## 2013-01-10 ENCOUNTER — Encounter: Payer: Self-pay | Admitting: Internal Medicine

## 2013-01-11 ENCOUNTER — Encounter: Payer: Self-pay | Admitting: Internal Medicine

## 2013-01-11 DIAGNOSIS — I10 Essential (primary) hypertension: Secondary | ICD-10-CM | POA: Insufficient documentation

## 2013-01-11 NOTE — Assessment & Plan Note (Addendum)
he declined previous referral for imaging and rheumatologic evaluation.

## 2013-01-11 NOTE — Assessment & Plan Note (Addendum)
Mild to moderate with no exacerbations in several months .  Continue symbicort and singulair.

## 2013-01-11 NOTE — Assessment & Plan Note (Signed)
Well controlled on current statin therapy.   Liver enzymes are normal , no changes today.  Lab Results  Component Value Date   CHOL 146 01/09/2013   HDL 43.80 01/09/2013   LDLCALC 92 01/09/2013   LDLDIRECT 73.6 11/27/2011   TRIG 49.0 01/09/2013   CHOLHDL 3 01/09/2013   Lab Results  Component Value Date   ALT 20 01/09/2013   AST 16 01/09/2013   ALKPHOS 49 01/09/2013   BILITOT 1.1 01/09/2013

## 2013-01-11 NOTE — Assessment & Plan Note (Signed)
He has no prior history of hypertension. He will check his blood pressure several times over the next 3-4 weeks and to submit readings for evaluation. Urine microalbumin to creatinine ratio done today is normal. 

## 2013-01-22 ENCOUNTER — Encounter: Payer: Self-pay | Admitting: Internal Medicine

## 2013-01-24 ENCOUNTER — Other Ambulatory Visit: Payer: Self-pay | Admitting: Internal Medicine

## 2013-01-24 ENCOUNTER — Encounter: Payer: Self-pay | Admitting: Internal Medicine

## 2013-01-24 MED ORDER — HYDROCHLOROTHIAZIDE 25 MG PO TABS: 25.0000 mg | ORAL_TABLET | Freq: Every day | ORAL | Status: DC

## 2013-02-19 ENCOUNTER — Other Ambulatory Visit: Payer: Self-pay | Admitting: *Deleted

## 2013-02-19 MED ORDER — HYDROCHLOROTHIAZIDE 25 MG PO TABS: 25.0000 mg | ORAL_TABLET | Freq: Every day | ORAL | Status: DC

## 2013-02-27 ENCOUNTER — Ambulatory Visit (INDEPENDENT_AMBULATORY_CARE_PROVIDER_SITE_OTHER): Payer: 59 | Admitting: Internal Medicine

## 2013-02-27 VITALS — BP 134/84 | HR 59 | Temp 97.6°F | Resp 16 | Ht 72.0 in | Wt 215.8 lb

## 2013-02-27 DIAGNOSIS — J069 Acute upper respiratory infection, unspecified: Secondary | ICD-10-CM

## 2013-02-27 DIAGNOSIS — R5383 Other fatigue: Secondary | ICD-10-CM

## 2013-02-27 DIAGNOSIS — Z0000 Encounter for general adult medical examination without abnormal findings: Secondary | ICD-10-CM

## 2013-02-27 DIAGNOSIS — T148XXA Other injury of unspecified body region, initial encounter: Secondary | ICD-10-CM

## 2013-02-27 DIAGNOSIS — G4733 Obstructive sleep apnea (adult) (pediatric): Secondary | ICD-10-CM

## 2013-02-27 DIAGNOSIS — E559 Vitamin D deficiency, unspecified: Secondary | ICD-10-CM

## 2013-02-27 DIAGNOSIS — R5381 Other malaise: Secondary | ICD-10-CM

## 2013-02-27 DIAGNOSIS — E785 Hyperlipidemia, unspecified: Secondary | ICD-10-CM

## 2013-02-27 DIAGNOSIS — I10 Essential (primary) hypertension: Secondary | ICD-10-CM

## 2013-02-27 DIAGNOSIS — Z125 Encounter for screening for malignant neoplasm of prostate: Secondary | ICD-10-CM

## 2013-02-27 LAB — CBC WITH DIFFERENTIAL/PLATELET
BASOS ABS: 0.1 10*3/uL (ref 0.0–0.1)
Basophils Relative: 0.6 % (ref 0.0–3.0)
Eosinophils Absolute: 0.2 10*3/uL (ref 0.0–0.7)
Eosinophils Relative: 2.9 % (ref 0.0–5.0)
HEMATOCRIT: 43.6 % (ref 39.0–52.0)
Hemoglobin: 14.7 g/dL (ref 13.0–17.0)
LYMPHS ABS: 1.8 10*3/uL (ref 0.7–4.0)
Lymphocytes Relative: 21.3 % (ref 12.0–46.0)
MCHC: 33.8 g/dL (ref 30.0–36.0)
MCV: 92.2 fl (ref 78.0–100.0)
MONOS PCT: 8.9 % (ref 3.0–12.0)
Monocytes Absolute: 0.7 10*3/uL (ref 0.1–1.0)
NEUTROS PCT: 66.3 % (ref 43.0–77.0)
Neutro Abs: 5.5 10*3/uL (ref 1.4–7.7)
PLATELETS: 251 10*3/uL (ref 150.0–400.0)
RBC: 4.73 Mil/uL (ref 4.22–5.81)
RDW: 12.3 % (ref 11.5–14.6)
WBC: 8.4 10*3/uL (ref 4.5–10.5)

## 2013-02-27 LAB — COMPREHENSIVE METABOLIC PANEL
ALT: 21 U/L (ref 0–53)
AST: 21 U/L (ref 0–37)
Albumin: 4.2 g/dL (ref 3.5–5.2)
Alkaline Phosphatase: 59 U/L (ref 39–117)
BUN: 21 mg/dL (ref 6–23)
CALCIUM: 9.6 mg/dL (ref 8.4–10.5)
CHLORIDE: 101 meq/L (ref 96–112)
CO2: 31 mEq/L (ref 19–32)
CREATININE: 1.1 mg/dL (ref 0.4–1.5)
GFR: 74.52 mL/min (ref >60.00)
GLUCOSE: 95 mg/dL (ref 70–99)
Potassium: 3.9 mEq/L (ref 3.5–5.1)
Sodium: 139 mEq/L (ref 135–145)
Total Bilirubin: 1.2 mg/dL (ref 0.3–1.2)
Total Protein: 7 g/dL (ref 6.0–8.3)

## 2013-02-27 LAB — PSA: PSA: 1.48 ng/mL (ref 0.10–4.00)

## 2013-02-27 NOTE — Progress Notes (Addendum)
Patient ID: Charles Mccann, male   DOB: 23-Jun-1950, 63 y.o.   MRN: [id number]  The patient is here for his annual male physical examination and management of other chronic and acute problems.  He is tolerating the  medication recently started for control of  hypertension Left shoulder and right wrist feeling better Colonoscopy planned for next month by Verta Ellen    The risk factors are reflected in the social history.  The roster of all physicians providing medical care to patient - is listed in the Snapshot section of the chart.  Activities of daily living:  The patient is 100% independent in all ADLs: dressing, toileting, feeding as well as independent mobility  Home safety : The patient has smoke detectors in the home. He wears seatbelts.  There are no firearms at home. There is no violence in the home.   There is no risks for hepatitis, STDs or HIV. There is no   history of blood transfusion. There is no travel history to infectious disease endemic areas of the world.  The patient has seen their dentist in the last six month and  their eye doctor in the last year.  They do not  have excessive sun exposure. They have seen a dermatoloigist in the last year. Discussed the need for sun protection: hats, long sleeves and use of sunscreen if there is significant sun exposure.   Diet: the importance of a healthy diet is discussed. They do have a healthy diet.  The benefits of regular aerobic exercise were discussed. He exercises a minimum of 30 minutes  5 days per week. Depression screen: there are no signs or vegative symptoms of depression- irritability, change in appetite, anhedonia, sadness/tearfullness.  The following portions of the patient's history were reviewed and updated as appropriate: allergies, current medications, past family history, past medical history,  past surgical history, past social history  and problem list.  Visual acuity was not assessed per patient preference since he  has regular follow up with his ophthalmologist. Hearing and body mass index were assessed and reviewed.   During the course of the visit the patient was educated and counseled about appropriate screening and preventive services including :  nutrition counseling, colorectal cancer screening, and recommended immunizations.     Objective:  BP 134/84  Pulse 59  Temp(Src) 97.6 F (36.4 C) (Oral)  Resp 16  Ht 6' (1.829 m)  Wt 215 lb 12 oz (97.864 kg)  BMI 29.25 kg/m2  SpO2 98%  General Appearance:    Alert, cooperative, no distress, appears stated age  Head:    Normocephalic, without obvious abnormality, atraumatic  Eyes:    PERRL, conjunctiva/corneas clear, EOM's intact, fundi    benign, both eyes       Ears:    Normal TM's and external ear canals, both ears  Nose:   Nares normal, septum midline, mucosa normal, no drainage   or sinus tenderness  Throat:   Lips, mucosa, and tongue normal; teeth and gums normal  Neck:   Supple, symmetrical, trachea midline, no adenopathy;       thyroid:  No enlargement/tenderness/nodules; no carotid   bruit or JVD  Back:     Symmetric, no curvature, ROM normal, no CVA tenderness  Lungs:     Clear to auscultation bilaterally, respirations unlabored  Chest wall:    No tenderness or deformity  Heart:    Regular rate and rhythm, S1 and S2 normal, no murmur, rub   or gallop  Abdomen:  Soft, non-tender, bowel sounds active all four quadrants,    no masses, no organomegaly  Genitalia:    Normal male without lesion, discharge or tenderness  Rectal:    Normal tone, normal prostate, no masses or tenderness;   guaiac negative stool  Extremities:   Extremities normal, atraumatic, no cyanosis or edema  Pulses:   2+ and symmetric all extremities  Skin:   Skin color, texture, turgor normal, no rashes or lesions  Lymph nodes:   Cervical, supraclavicular, and axillary nodes normal  Neurologic:   CNII-XII intact. Normal strength, sensation and reflexes       throughout    Assessment and Plan:  Obstructive sleep apnea of adult Using CPAP nightly for the last 3 years since dx august 2011 . Using it nightly.  Cleans it seldom due to irritation of lungs after   Hypertension Well controlled on current regimen. Renal function stable, no changes today.  Lab Results  Component Value Date   CREATININE 1.1 02/27/2013   Lab Results  Component Value Date   NA 139 02/27/2013   K 3.9 02/27/2013   CL 101 02/27/2013   CO2 31 02/27/2013     Hyperlipidemia LDL goal < 100 Well controlled on current statin therapy.   Liver enzymes are normal , no changes today.  Lab Results  Component Value Date   CHOL 146 01/09/2013   HDL 43.80 01/09/2013   LDLCALC 92 01/09/2013   LDLDIRECT 73.6 11/27/2011   TRIG 49.0 01/09/2013   CHOLHDL 3 01/09/2013   Lab Results  Component Value Date   ALT 21 02/27/2013   AST 21 02/27/2013   ALKPHOS 59 02/27/2013   BILITOT 1.2 02/27/2013     Routine general medical examination at a health care facility Annual male exam was done including testicular and prostate exam. PSA is normal .  Colon ca screening was reviewed and options given.    Lab Results  Component Value Date   PSA 1.48 02/27/2013   PSA 0.85 02/19/2012      Updated Medication List Outpatient Encounter Prescriptions as of 02/27/2013  Medication Sig  . albuterol (PROVENTIL HFA;VENTOLIN HFA) 108 (90 BASE) MCG/ACT inhaler Inhale 2 puffs into the lungs every 6 (six) hours as needed.  Marland Kitchen atorvastatin (LIPITOR) 10 MG tablet Take 10 mg by mouth daily.  . budesonide-formoterol (SYMBICORT) 160-4.5 MCG/ACT inhaler Inhale 2 puffs into the lungs 2 (two) times daily.  Marland Kitchen esomeprazole (NEXIUM) 40 MG capsule Take 40 mg by mouth daily before breakfast.  . fluticasone (FLONASE) 50 MCG/ACT nasal spray Place 2 sprays into the nose daily.  . hydrochlorothiazide (HYDRODIURIL) 25 MG tablet Take 1 tablet (25 mg total) by mouth daily.  Marland Kitchen levalbuterol (XOPENEX) 0.63 MG/3ML nebulizer  solution Take 3 mLs (0.63 mg total) by nebulization every 4 (four) hours as needed for wheezing.  . montelukast (SINGULAIR) 10 MG tablet Take 10 mg by mouth at bedtime.  Marland Kitchen VIAGRA 100 MG tablet TAKE 1 TABLET DAILY AS NEEDED  . albuterol (PROVENTIL) (2.5 MG/3ML) 0.083% nebulizer solution Take 3 mLs (2.5 mg total) by nebulization every 6 (six) hours as needed for wheezing.  . [DISCONTINUED] zoster vaccine live, PF, (ZOSTAVAX) 86767 UNT/0.65ML injection Inject 19,400 Units into the skin once.

## 2013-02-27 NOTE — Patient Instructions (Signed)
You had your annual wellness exam today  It is not clear if insurance  will pay for the new pneumonia vaccine  ("Prevnar")  Since you had the Pnuemovax < 5 years ago,  So discuss with Dr. Raul Del at your next visit .  We will contact you with the bloodwork results

## 2013-02-27 NOTE — Progress Notes (Signed)
Pre-visit discussion using our clinic review tool. No additional management support is needed unless otherwise documented below in the visit note.  

## 2013-02-27 NOTE — Assessment & Plan Note (Addendum)
Using CPAP nightly for the last 3 years since dx august 2011 . Using it nightly.  Cleans it seldom due to irritation of lungs after

## 2013-02-28 LAB — VITAMIN D 25 HYDROXY (VIT D DEFICIENCY, FRACTURES): VIT D 25 HYDROXY: 26 ng/mL — AB (ref 30–89)

## 2013-03-01 ENCOUNTER — Encounter: Payer: Self-pay | Admitting: Internal Medicine

## 2013-03-01 NOTE — Assessment & Plan Note (Signed)
Annual male exam was done including testicular and prostate exam. PSA is normal .  Colon ca screening was reviewed and options given.    Lab Results  Component Value Date   PSA 1.48 02/27/2013   PSA 0.85 02/19/2012

## 2013-03-01 NOTE — Addendum Note (Signed)
Addended by: Crecencio Mc on: 03/01/2013 10:37 PM   Modules accepted: Level of Service

## 2013-03-01 NOTE — Assessment & Plan Note (Signed)
Well controlled on current regimen. Renal function stable, no changes today.  Lab Results  Component Value Date   CREATININE 1.1 02/27/2013   Lab Results  Component Value Date   NA 139 02/27/2013   K 3.9 02/27/2013   CL 101 02/27/2013   CO2 31 02/27/2013

## 2013-03-01 NOTE — Assessment & Plan Note (Signed)
Well controlled on current statin therapy.   Liver enzymes are normal , no changes today.  Lab Results  Component Value Date   CHOL 146 01/09/2013   HDL 43.80 01/09/2013   LDLCALC 92 01/09/2013   LDLDIRECT 73.6 11/27/2011   TRIG 49.0 01/09/2013   CHOLHDL 3 01/09/2013   Lab Results  Component Value Date   ALT 21 02/27/2013   AST 21 02/27/2013   ALKPHOS 59 02/27/2013   BILITOT 1.2 02/27/2013

## 2013-03-02 ENCOUNTER — Encounter: Payer: Self-pay | Admitting: Internal Medicine

## 2013-03-04 MED ORDER — PREDNISONE (PAK) 10 MG PO TABS: ORAL_TABLET | ORAL | Status: DC

## 2013-03-04 NOTE — Assessment & Plan Note (Signed)
Reported on the after visit questionnaire, as occurring due to exposure to sick patients in our waiting room on the day of his annual pyhsical. History of asthma. Requesting antibiotic and prednisone to "prevent " pneumonia.  abx  Denied but will rx prednisone

## 2013-03-04 NOTE — Addendum Note (Signed)
Addended by: Crecencio Mc on: 03/04/2013 09:25 AM   Modules accepted: Orders

## 2013-03-07 IMAGING — CR DG CERVICAL SPINE COMPLETE 4+V
5 series · 5 of 5 positions shown · non-contrast
Comparison: None.

CLINICAL DATA: Shoulder pain

CERVICAL SPINE - COMPLETE 4+ VIEW

[view not recorded (1 of 5)]
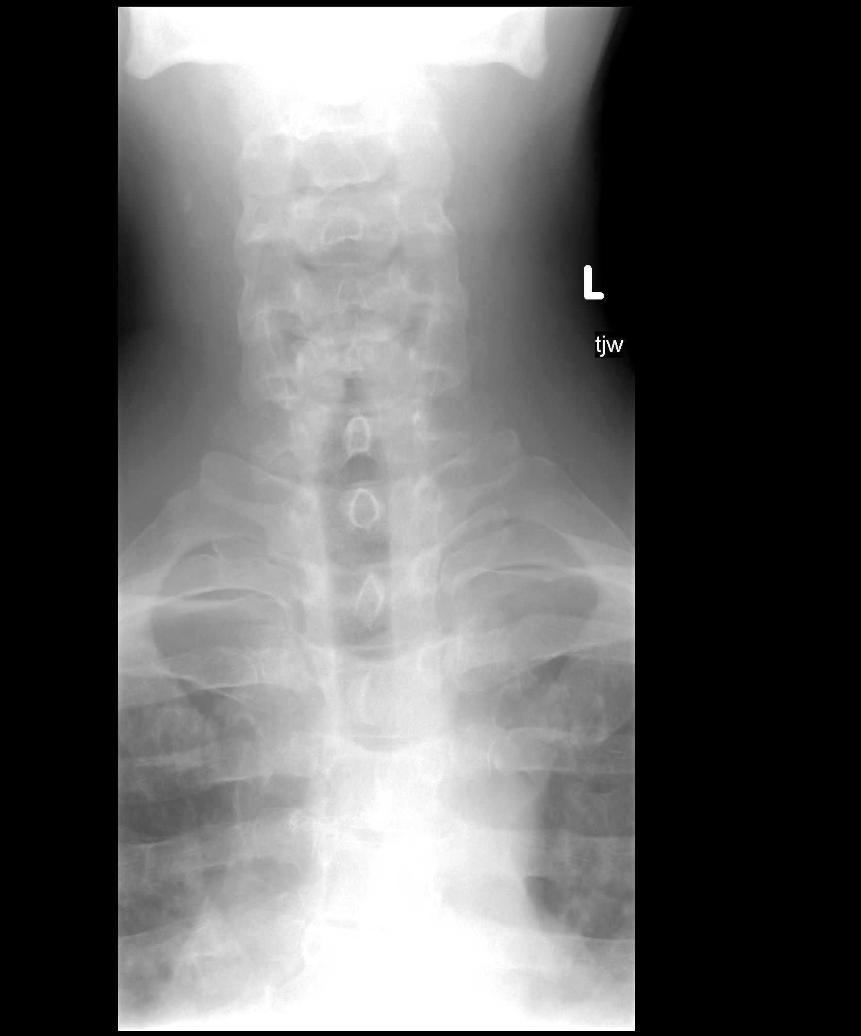

[view not recorded (2 of 5)]
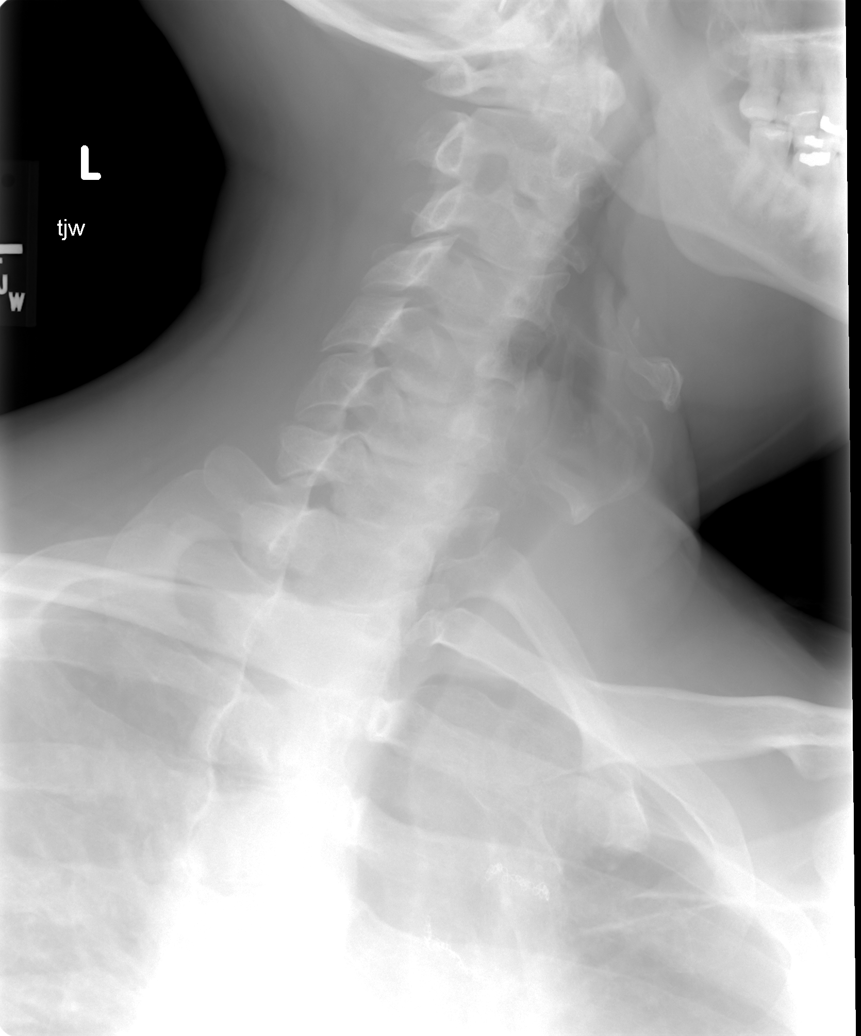

[view not recorded (3 of 5)]
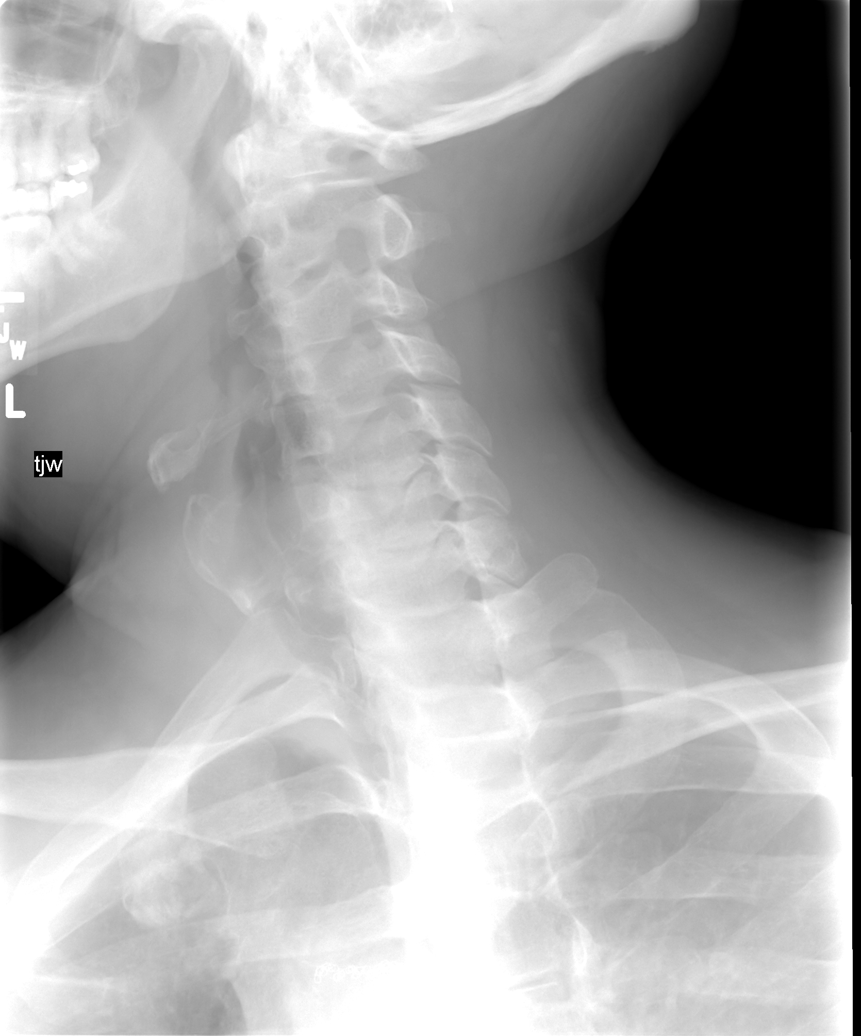

[view not recorded (4 of 5)]
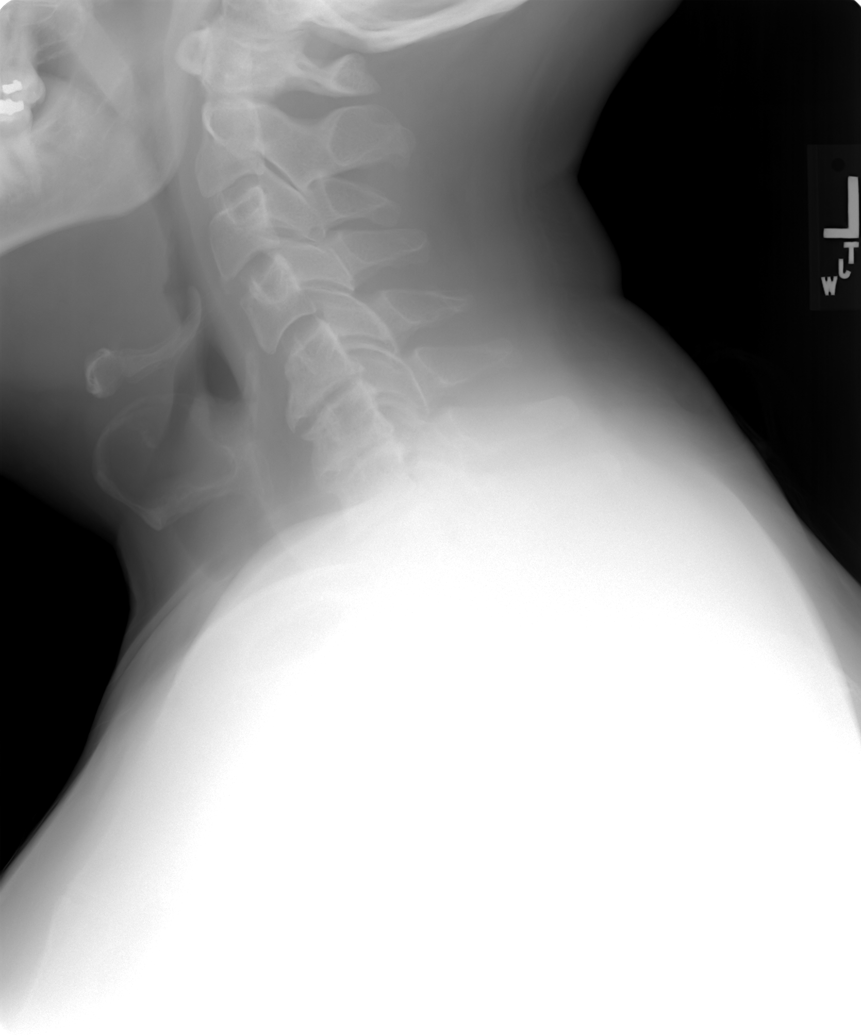

[view not recorded (5 of 5)]
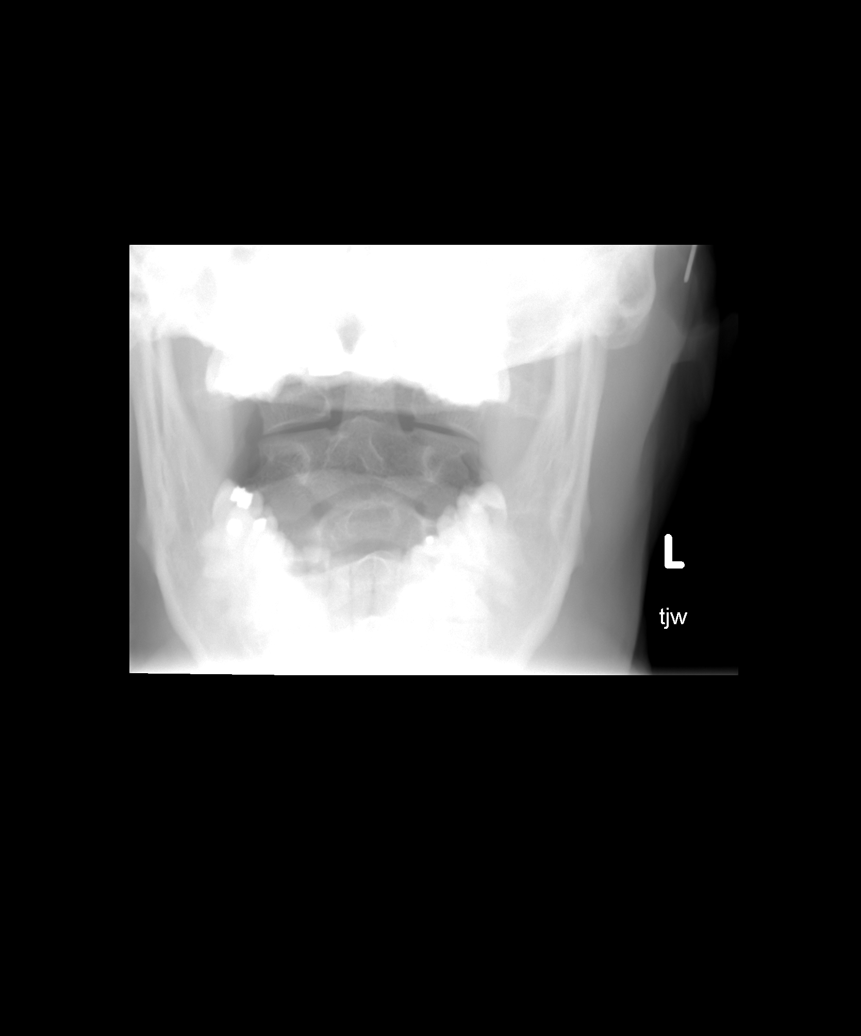

[5 of 5 positions shown; findings below may reference images not displayed]

FINDINGS: There is mild curvature convex to the left.  There is
chronic spondylosis at C5-6, C6-7 and C7-T1 with disc space
narrowing is small marginal osteophytes.  There is mild osteophytic
encroachment upon the foramina at C5-6, C6-7 and C7-T1.
IMPRESSION: Cervicothoracic curvature.  Degenerative cervical spondylosis as
above.

## 2013-03-26 LAB — HM COLONOSCOPY

## 2013-03-28 ENCOUNTER — Ambulatory Visit: Payer: Self-pay | Admitting: Unknown Physician Specialty

## 2013-03-31 LAB — PATHOLOGY REPORT

## 2013-09-12 ENCOUNTER — Other Ambulatory Visit: Payer: Self-pay | Admitting: Internal Medicine

## 2013-12-19 ENCOUNTER — Other Ambulatory Visit: Payer: Self-pay | Admitting: Internal Medicine

## 2014-02-10 ENCOUNTER — Other Ambulatory Visit: Payer: Self-pay | Admitting: Internal Medicine

## 2014-02-10 ENCOUNTER — Encounter: Payer: Self-pay | Admitting: Internal Medicine

## 2014-02-10 DIAGNOSIS — J069 Acute upper respiratory infection, unspecified: Secondary | ICD-10-CM

## 2014-02-10 NOTE — Telephone Encounter (Signed)
Responded to patient's other mychart that appt is needed for prescriptions.

## 2014-02-10 NOTE — Telephone Encounter (Signed)
LAst visit 02/27/13, see mychart message

## 2014-03-26 ENCOUNTER — Other Ambulatory Visit: Payer: Self-pay | Admitting: Internal Medicine

## 2014-03-27 ENCOUNTER — Encounter: Payer: Self-pay | Admitting: *Deleted

## 2014-04-24 ENCOUNTER — Ambulatory Visit (INDEPENDENT_AMBULATORY_CARE_PROVIDER_SITE_OTHER): Payer: 59 | Admitting: Internal Medicine

## 2014-04-24 ENCOUNTER — Encounter: Payer: Self-pay | Admitting: Internal Medicine

## 2014-04-24 VITALS — BP 110/76 | HR 77 | Temp 98.2°F | Resp 16 | Ht 72.0 in | Wt 215.2 lb

## 2014-04-24 DIAGNOSIS — R053 Chronic cough: Secondary | ICD-10-CM | POA: Insufficient documentation

## 2014-04-24 DIAGNOSIS — Z125 Encounter for screening for malignant neoplasm of prostate: Secondary | ICD-10-CM

## 2014-04-24 DIAGNOSIS — I10 Essential (primary) hypertension: Secondary | ICD-10-CM

## 2014-04-24 DIAGNOSIS — Z0001 Encounter for general adult medical examination with abnormal findings: Secondary | ICD-10-CM

## 2014-04-24 DIAGNOSIS — M180 Bilateral primary osteoarthritis of first carpometacarpal joints: Secondary | ICD-10-CM

## 2014-04-24 DIAGNOSIS — Z23 Encounter for immunization: Secondary | ICD-10-CM

## 2014-04-24 DIAGNOSIS — M79644 Pain in right finger(s): Secondary | ICD-10-CM | POA: Insufficient documentation

## 2014-04-24 DIAGNOSIS — E785 Hyperlipidemia, unspecified: Secondary | ICD-10-CM

## 2014-04-24 DIAGNOSIS — R059 Cough, unspecified: Secondary | ICD-10-CM | POA: Insufficient documentation

## 2014-04-24 DIAGNOSIS — N529 Male erectile dysfunction, unspecified: Secondary | ICD-10-CM

## 2014-04-24 DIAGNOSIS — R5383 Other fatigue: Secondary | ICD-10-CM

## 2014-04-24 DIAGNOSIS — R05 Cough: Secondary | ICD-10-CM

## 2014-04-24 DIAGNOSIS — Q213 Tetralogy of Fallot: Secondary | ICD-10-CM

## 2014-04-24 DIAGNOSIS — J441 Chronic obstructive pulmonary disease with (acute) exacerbation: Secondary | ICD-10-CM

## 2014-04-24 DIAGNOSIS — Z0000 Encounter for general adult medical examination without abnormal findings: Secondary | ICD-10-CM

## 2014-04-24 DIAGNOSIS — N528 Other male erectile dysfunction: Secondary | ICD-10-CM

## 2014-04-24 LAB — CBC WITH DIFFERENTIAL/PLATELET
BASOS ABS: 0.1 10*3/uL (ref 0.0–0.1)
BASOS PCT: 0.7 % (ref 0.0–3.0)
EOS ABS: 0.4 10*3/uL (ref 0.0–0.7)
Eosinophils Relative: 4.5 % (ref 0.0–5.0)
HCT: 45 % (ref 39.0–52.0)
Hemoglobin: 15.3 g/dL (ref 13.0–17.0)
LYMPHS ABS: 2.8 10*3/uL (ref 0.7–4.0)
Lymphocytes Relative: 27.9 % (ref 12.0–46.0)
MCHC: 34.1 g/dL (ref 30.0–36.0)
MCV: 91.8 fl (ref 78.0–100.0)
MONO ABS: 0.9 10*3/uL (ref 0.1–1.0)
Monocytes Relative: 9.2 % (ref 3.0–12.0)
Neutro Abs: 5.7 10*3/uL (ref 1.4–7.7)
Neutrophils Relative %: 57.7 % (ref 43.0–77.0)
Platelets: 304 10*3/uL (ref 150.0–400.0)
RBC: 4.9 Mil/uL (ref 4.22–5.81)
RDW: 12.9 % (ref 11.5–15.5)
WBC: 9.9 10*3/uL (ref 4.0–10.5)

## 2014-04-24 LAB — LIPID PANEL
CHOL/HDL RATIO: 3
Cholesterol: 144 mg/dL (ref 0–200)
HDL: 44.9 mg/dL (ref >39.00)
LDL Cholesterol: 88 mg/dL (ref 0–99)
NonHDL: 99.1
Triglycerides: 57 mg/dL (ref 0.0–149.0)
VLDL: 11.4 mg/dL (ref 0.0–40.0)

## 2014-04-24 LAB — COMPREHENSIVE METABOLIC PANEL
ALT: 20 U/L (ref 0–53)
AST: 16 U/L (ref 0–37)
Albumin: 4.2 g/dL (ref 3.5–5.2)
Alkaline Phosphatase: 69 U/L (ref 39–117)
BUN: 27 mg/dL — ABNORMAL HIGH (ref 6–23)
CHLORIDE: 102 meq/L (ref 96–112)
CO2: 32 mEq/L (ref 19–32)
Calcium: 9.3 mg/dL (ref 8.4–10.5)
Creatinine, Ser: 1.02 mg/dL (ref 0.40–1.50)
GFR: 78.46 mL/min (ref >60.00)
Glucose, Bld: 93 mg/dL (ref 70–99)
POTASSIUM: 4 meq/L (ref 3.5–5.1)
Sodium: 140 mEq/L (ref 135–145)
Total Bilirubin: 0.8 mg/dL (ref 0.2–1.2)
Total Protein: 6.7 g/dL (ref 6.0–8.3)

## 2014-04-24 LAB — TSH: TSH: 3.24 u[IU]/mL (ref 0.35–4.50)

## 2014-04-24 LAB — PSA: PSA: 1.71 ng/mL (ref 0.10–4.00)

## 2014-04-24 NOTE — Progress Notes (Signed)
Pre visit review using our clinic review tool, if applicable. No additional management support is needed unless otherwise documented below in the visit note. 

## 2014-04-24 NOTE — Progress Notes (Signed)
Patient ID: Charles Mccann     The patient is here for his annual male physical examination and management of other chronic and acute problems.  HAS BEEN HAVING PRODUCTIVE COUGH WITH FITS OF COUGHING LASTING SEVERAL MINUTES,   Off and on since November .  Dr Raul Del treated him  with prednisone and Z pack. Azithromycin was finished on Monday ,  Has improved,  Sinus drainage has been same color,  flushing sinuses occasionally  The risk factors are reflected in the social history.  The roster of all physicians providing medical care to patient - is listed in the Snapshot section of the chart.  Activities of daily living:  The patient is 100% independent in all ADLs: dressing, toileting, feeding as well as independent mobility  Home safety : The patient has smoke detectors in the home. He wears seatbelts.  There are no firearms at home. There is no violence in the home.   There is no risks for hepatitis, STDs or HIV. There is no   history of blood transfusion. There is no travel history to infectious disease endemic areas of the world.  The patient has seen their dentist in the last six month and  their eye doctor in the last year. He has a right sided cataract, which is managed by [facility] . They do not  have excessive sun exposure. They have seen a dermatoloigist in the last year. Discussed the need for sun protection: hats, long sleeves and use of sunscreen if there is significant sun exposure.   Diet: the importance of a healthy diet is discussed. They do have a healthy diet.  The benefits of regular aerobic exercise were discussed. He exercises a minimum of 30 minutes  5 days per week..his Left shoulder has been feeling better.  He continues to have pain at the base of the right thumb secondary to OA.  Depression screen: there are no signs or vegative symptoms of depression- irritability, change in appetite, anhedonia, sadness/tearfullness.  The following portions of  the patient's history were reviewed and updated as appropriate: allergies, current medications, past family history, past medical history,  past surgical history, past social history  and problem list.  Visual acuity was not assessed per patient preference since he has regular follow up with his ophthalmologist. Hearing and body mass index were assessed and reviewed.   During the course of the visit the patient was educated and counseled about appropriate screening and preventive services including :  nutrition counseling, colorectal cancer screening, and recommended immunizations.     Revew of Systems:  Patient denies headache, fevers, malaise, unintentional weight loss, skin rash, eye pain, sinus congestion and sinus pain, sore throat, dysphagia,  hemoptysis , , dyspnea, wheezing, chest pain, palpitations, orthopnea, edema, abdominal pain, nausea, melena, diarrhea, constipation, flank pain, dysuria, hematuria, urinary  Frequency, nocturia, numbness, tingling, seizures,  Focal weakness, Loss of consciousness,  Tremor, insomnia, depression, anxiety, and suicidal ideation.    Objective:  BP 110/76 mmHg  Pulse 77  Temp(Src) 98.2 F (36.8 C) (Oral)  Resp 16  Ht 6' (1.829 m)  Wt 215 lb 4 oz (97.637 kg)  BMI 29.19 kg/m2  SpO2 93%  General Appearance:    Alert, cooperative, no distress, appears stated age  Head:    Normocephalic, without obvious abnormality, atraumatic  Eyes:    PERRL, conjunctiva/corneas clear, EOM's intact, fundi    benign, both eyes       Ears:    Normal TM's and  external ear canals, both ears  Nose:   Nares normal, septum midline, mucosa normal, no drainage   or sinus tenderness  Throat:   Lips, mucosa, and tongue normal; teeth and gums normal  Neck:   Supple, symmetrical, trachea midline, no adenopathy;       thyroid:  No enlargement/tenderness/nodules; no carotid   bruit or JVD  Back:     Symmetric, no curvature, ROM normal, no CVA tenderness  Lungs:     Clear to  auscultation bilaterally, o wheezing,  respirations unlabored  Chest wall:    No tenderness or deformity  Heart:    Regular rate and rhythm, S1 and S2 normal, no murmur, rub   or gallop  Abdomen:     Soft, non-tender, bowel sounds active all four quadrants,    no masses, no organomegaly  Genitalia:    Normal male without lesion, discharge or tenderness  Rectal:    Normal tone, normal prostate, no masses or tenderness;   guaiac negative stool  Extremities:   Extremities normal, atraumatic, no cyanosis or edema  Pulses:   2+ and symmetric all extremities  Skin:   Skin color is ta,  Sun damage noted.  e, turgor normal, no rashes or lesions  Lymph nodes:   Cervical, supraclavicular, and axillary nodes normal  Neurologic:   CNII-XII intact. Normal strength, sensation and reflexes      throughout    Assessment and Plan:  Problem List Items Addressed This Visit      Unprioritized   Tetralogy of Fallot - Primary    He underwent corrective surgery at [facility] in 1974 and has been released from their core over 5 years ago.  H ehas no signs of heart failure on exam.       Routine general medical examination at a health care facility    .Annual wellness  exam was done as well as a comprehensive physical exam and management of acute and chronic conditions .  During the course of the visit the patient was educated and counseled about appropriate screening and preventive services including :  diabetes screening, lipid analysis with projected  10 year  risk for CAD , nutrition counseling, colorectal cancer screening, and recommended immunizations.  Printed recommendations for health maintenance screenings was given.        Primary osteoarthritis of both first carpometacarpal joints    Advised to try Motrin 800 mg tid prn ; see Orthopedics for steroid injection if no improvement       Pain of right thumb   Impotence due to erectile dysfunction    Managed with prn viagra       Hypertension     Patient has been lost to follow up for over one year.  His BP fortunately has been well controlled on current regimen. Renal function stable, no changes today. Will refill medications for six months.  . Lab Results  Component Value Date   CREATININE 1.02 04/24/2014   Lab Results  Component Value Date   ALT 20 04/24/2014   AST 16 04/24/2014   ALKPHOS 69 04/24/2014   BILITOT 0.8 04/24/2014         Relevant Orders   Comprehensive metabolic panel (Completed)   Hyperlipidemia with target LDL less than 100    He has been lost to follow up for over one year .  Fortunately his lipids are well controlled on current statin therapy. And his liver enzymes are normal , no changes today.  Lab Results  Component Value Date   CHOL 144 04/24/2014   HDL 44.90 04/24/2014   LDLCALC 88 04/24/2014   LDLDIRECT 73.6 11/27/2011   TRIG 57.0 04/24/2014   CHOLHDL 3 04/24/2014   Lab Results  Component Value Date   ALT 20 04/24/2014   AST 16 04/24/2014   ALKPHOS 69 04/24/2014   BILITOT 0.8 04/24/2014         Cough   Relevant Orders   Bordetella pertussis Ab IgG, IgA   Bronchitis, chronic obstructive, with exacerbation    Treated with azithromycin by pulmo logist with persistent cough. Given the severity and clonicity of he cough,, checking for infection with pertussis,  Symptoms control regimen dicussed at length .  Advised to take probiotic for  3 weeks in the future when using antibiotics        Other Visit Diagnoses    Screening for prostate cancer        Relevant Orders    PSA (Completed)    Other fatigue        Relevant Orders    CBC with Differential/Platelet (Completed)    TSH (Completed)    Hyperlipemia        Relevant Orders    Lipid panel (Completed)    Need for prophylactic vaccination against Streptococcus pneumoniae (pneumococcus)        Relevant Orders    Pneumococcal conjugate vaccine 13-valent (Completed)

## 2014-04-24 NOTE — Patient Instructions (Addendum)
try using [employer]'s sinus rinse twice daily before you use your Flonase .  It will FLush OUT YOUR SINUSES OF VIRUSES,  BACTERIA AND MUCUS  Check your Nyquil for dipenhydramine (benadryl) ingredient,  If not, yo might try adding it at night to help dry u your sinuses  Checking you for whooping cough today   Daily use of Probiotics for  3 weeks advised to reduce risk of C dificile colitis. ( Align, Floraque or [employer]) whenever  you start ane antibiotic to prevent a serious antibiotic associated diarrhea  Called" clostridium dificile colitis"  You may suspend the hctz for a week and if BP remains < 130/80 you do not need additional BP medication   Health Maintenance A healthy lifestyle and preventative care can promote health and wellness.  Maintain regular health, dental, and eye exams.  Eat a healthy diet. Foods like vegetables, fruits, whole grains, low-fat dairy products, and lean protein foods contain the nutrients you need and are low in calories. Decrease your intake of foods high in solid fats, added sugars, and salt. Get information about a proper diet from your health care provider, if necessary.  Regular physical exercise is one of the most important things you can do for your health. Most adults should get at least 150 minutes of moderate-intensity exercise (any activity that increases your heart rate and causes you to sweat) each week. In addition, most adults need muscle-strengthening exercises on 2 or more days a week.   Maintain a healthy weight. The body mass index (BMI) is a screening tool to identify possible weight problems. It provides an estimate of body fat based on height and weight. Your health care provider can find your BMI and can help you achieve or maintain a healthy weight. For males 20 years and older:  A BMI below 18.5 is considered underweight.  A BMI of 18.5 to 24.9 is normal.  A BMI of 25 to 29.9 is considered overweight.  A BMI of 30 and above is  considered obese.  Maintain normal blood lipids and cholesterol by exercising and minimizing your intake of saturated fat. Eat a balanced diet with plenty of fruits and vegetables. Blood tests for lipids and cholesterol should begin at age 58 and be repeated every 5 years. If your lipid or cholesterol levels are high, you are over age 76, or you are at high risk for heart disease, you may need your cholesterol levels checked more frequently.Ongoing high lipid and cholesterol levels should be treated with medicines if diet and exercise are not working.  If you smoke, find out from your health care provider how to quit. If you do not use tobacco, do not start.  Lung cancer screening is recommended for adults aged 3-80 years who are at high risk for developing lung cancer because of a history of smoking. A yearly low-dose CT scan of the lungs is recommended for people who have at least a 30-pack-year history of smoking and are current smokers or have quit within the past 15 years. A pack year of smoking is smoking an average of 1 pack of cigarettes a day for 1 year (for example, a 30-pack-year history of smoking could mean smoking 1 pack a day for 30 years or 2 packs a day for 15 years). Yearly screening should continue until the smoker has stopped smoking for at least 15 years. Yearly screening should be stopped for people who develop a health problem that would prevent them from having lung cancer treatment.  If you choose to drink alcohol, do not have more than 2 drinks per day. One drink is considered to be 12 oz (360 mL) of beer, 5 oz (150 mL) of wine, or 1.5 oz (45 mL) of liquor.  Avoid the use of street drugs. Do not share needles with anyone. Ask for help if you need support or instructions about stopping the use of drugs.  High blood pressure causes heart disease and increases the risk of stroke. Blood pressure should be checked at least every 1-2 years. Ongoing high blood pressure should be  treated with medicines if weight loss and exercise are not effective.  If you are 3-67 years old, ask your health care provider if you should take aspirin to prevent heart disease.  Diabetes screening involves taking a blood sample to check your fasting blood sugar level. This should be done once every 3 years after age 82 if you are at a normal weight and without risk factors for diabetes. Testing should be considered at a younger age or be carried out more frequently if you are overweight and have at least 1 risk factor for diabetes.  Colorectal cancer can be detected and often prevented. Most routine colorectal cancer screening begins at the age of 101 and continues through age 44. However, your health care provider may recommend screening at an earlier age if you have risk factors for colon cancer. On a yearly basis, your health care provider may provide home test kits to check for hidden blood in the stool. A small camera at the end of a tube may be used to directly examine the colon (sigmoidoscopy or colonoscopy) to detect the earliest forms of colorectal cancer. Talk to your health care provider about this at age 40 when routine screening begins. A direct exam of the colon should be repeated every 5-10 years through age 29, unless early forms of precancerous polyps or small growths are found.  People who are at an increased risk for hepatitis B should be screened for this virus. You are considered at high risk for hepatitis B if:  You were born in a country where hepatitis B occurs often. Talk with your health care provider about which countries are considered high risk.  Your parents were born in a high-risk country and you have not received a shot to protect against hepatitis B (hepatitis B vaccine).  You have HIV or AIDS.  You use needles to inject street drugs.  You live with, or have sex with, someone who has hepatitis B.  You are a man who has sex with other men (MSM).  You get  hemodialysis treatment.  You take certain medicines for conditions like cancer, organ transplantation, and autoimmune conditions.  Hepatitis C blood testing is recommended for all people born from [birth year] through 1964 and any individual with known risk factors for hepatitis C.  Healthy men should no longer receive prostate-specific antigen (PSA) blood tests as part of routine cancer screening. Talk to your health care provider about prostate cancer screening.  Testicular cancer screening is not recommended for adolescents or adult males who have no symptoms. Screening includes self-exam, a health care provider exam, and other screening tests. Consult with your health care provider about any symptoms you have or any concerns you have about testicular cancer.  Practice safe sex. Use condoms and avoid high-risk sexual practices to reduce the spread of sexually transmitted infections (STIs).  You should be screened for STIs, including gonorrhea and chlamydia if:  You  are sexually active and are younger than 24 years.  You are older than 24 years, and your health care provider tells you that you are at risk for this type of infection.  Your sexual activity has changed since you were last screened, and you are at an increased risk for chlamydia or gonorrhea. Ask your health care provider if you are at risk.  If you are at risk of being infected with HIV, it is recommended that you take a prescription medicine daily to prevent HIV infection. This is called pre-exposure prophylaxis (PrEP). You are considered at risk if:  You are a man who has sex with other men (MSM).  You are a heterosexual man who is sexually active with multiple partners.  You take drugs by injection.  You are sexually active with a partner who has HIV.  Talk with your health care provider about whether you are at high risk of being infected with HIV. If you choose to begin PrEP, you should first be tested for HIV. You should  then be tested every 3 months for as long as you are taking PrEP.  Use sunscreen. Apply sunscreen liberally and repeatedly throughout the day. You should seek shade when your shadow is shorter than you. Protect yourself by wearing long sleeves, pants, a wide-brimmed hat, and sunglasses year round whenever you are outdoors.  Tell your health care provider of new moles or changes in moles, especially if there is a change in shape or color. Also, tell your health care provider if a mole is larger than the size of a pencil eraser.  A one-time screening for abdominal aortic aneurysm (AAA) and surgical repair of large AAAs by ultrasound is recommended for men aged 6-75 years who are current or former smokers.  Stay current with your vaccines (immunizations). Document Released: 07/22/2007 Document Revised: 01/28/2013 Document Reviewed: 06/20/2010 [facility] Patient Information 2015 [place], [place]. This information is not intended to replace advice given to you by your health care provider. Make sure you discuss any questions you have with your health care provider.

## 2014-04-26 ENCOUNTER — Encounter: Payer: Self-pay | Admitting: Internal Medicine

## 2014-04-26 DIAGNOSIS — J441 Chronic obstructive pulmonary disease with (acute) exacerbation: Secondary | ICD-10-CM

## 2014-04-26 DIAGNOSIS — N529 Male erectile dysfunction, unspecified: Secondary | ICD-10-CM

## 2014-04-26 HISTORY — DX: Chronic obstructive pulmonary disease with (acute) exacerbation: J44.1

## 2014-04-26 HISTORY — DX: Male erectile dysfunction, unspecified: N52.9

## 2014-04-26 NOTE — Assessment & Plan Note (Signed)

## 2014-04-26 NOTE — Assessment & Plan Note (Signed)
Managed with prn viagra

## 2014-04-26 NOTE — Assessment & Plan Note (Addendum)
He has been lost to follow up for over one year .  Fortunately his lipids are well controlled on current statin therapy. And his liver enzymes are normal , no changes today.  Lab Results  Component Value Date   CHOL 144 04/24/2014   HDL 44.90 04/24/2014   LDLCALC 88 04/24/2014   LDLDIRECT 73.6 11/27/2011   TRIG 57.0 04/24/2014   CHOLHDL 3 04/24/2014   Lab Results  Component Value Date   ALT 20 04/24/2014   AST 16 04/24/2014   ALKPHOS 69 04/24/2014   BILITOT 0.8 04/24/2014

## 2014-04-26 NOTE — Assessment & Plan Note (Signed)
He underwent corrective surgery at [facility] in 1974 and has been released from their core over 5 years ago.  H ehas no signs of heart failure on exam.

## 2014-04-26 NOTE — Assessment & Plan Note (Addendum)
Patient has been lost to follow up for over one year.  His BP fortunately has been well controlled on current regimen. Renal function stable, no changes today. Will refill medications for six months.  . Lab Results  Component Value Date   CREATININE 1.02 04/24/2014   Lab Results  Component Value Date   ALT 20 04/24/2014   AST 16 04/24/2014   ALKPHOS 69 04/24/2014   BILITOT 0.8 04/24/2014

## 2014-04-26 NOTE — Assessment & Plan Note (Signed)
Treated with azithromycin by pulmo logist with persistent cough. Given the severity and clonicity of he cough,, checking for infection with pertussis,  Symptoms control regimen dicussed at length .  Advised to take probiotic for  3 weeks in the future when using antibiotics

## 2014-04-26 NOTE — Assessment & Plan Note (Signed)
Advised to try Motrin 800 mg tid prn ; see Orthopedics for steroid injection if no improvement

## 2014-04-27 LAB — BORDETELLA PERTUSSIS AB IGG,IGA
FHA IGG: 88 [IU]/mL
FHA IgA: 110 IU/mL — ABNORMAL HIGH
PT IgA: 4 IU/mL
PT IgG: 47 IU/mL — ABNORMAL HIGH

## 2014-05-04 ENCOUNTER — Encounter: Payer: Self-pay | Admitting: Internal Medicine

## 2014-10-23 ENCOUNTER — Encounter: Payer: Self-pay | Admitting: Internal Medicine

## 2014-10-24 ENCOUNTER — Other Ambulatory Visit: Payer: Self-pay | Admitting: *Deleted

## 2014-10-24 MED ORDER — ATORVASTATIN CALCIUM 10 MG PO TABS: 10.0000 mg | ORAL_TABLET | Freq: Every day | ORAL | Status: DC

## 2014-10-26 ENCOUNTER — Ambulatory Visit (INDEPENDENT_AMBULATORY_CARE_PROVIDER_SITE_OTHER): Payer: 59 | Admitting: Internal Medicine

## 2014-10-26 ENCOUNTER — Encounter: Payer: Self-pay | Admitting: Internal Medicine

## 2014-10-26 VITALS — BP 130/80 | HR 61 | Temp 98.4°F | Resp 14 | Ht 72.0 in | Wt 212.0 lb

## 2014-10-26 DIAGNOSIS — K2100 Gastro-esophageal reflux disease with esophagitis, without bleeding: Secondary | ICD-10-CM

## 2014-10-26 DIAGNOSIS — J069 Acute upper respiratory infection, unspecified: Secondary | ICD-10-CM

## 2014-10-26 DIAGNOSIS — J441 Chronic obstructive pulmonary disease with (acute) exacerbation: Secondary | ICD-10-CM

## 2014-10-26 DIAGNOSIS — Z79899 Other long term (current) drug therapy: Secondary | ICD-10-CM | POA: Diagnosis not present

## 2014-10-26 DIAGNOSIS — E559 Vitamin D deficiency, unspecified: Secondary | ICD-10-CM | POA: Diagnosis not present

## 2014-10-26 DIAGNOSIS — J4520 Mild intermittent asthma, uncomplicated: Secondary | ICD-10-CM | POA: Diagnosis not present

## 2014-10-26 DIAGNOSIS — E785 Hyperlipidemia, unspecified: Secondary | ICD-10-CM

## 2014-10-26 DIAGNOSIS — I10 Essential (primary) hypertension: Secondary | ICD-10-CM

## 2014-10-26 LAB — CBC WITH DIFFERENTIAL/PLATELET
BASOS ABS: 0.1 10*3/uL (ref 0.0–0.1)
Basophils Relative: 0.8 % (ref 0.0–3.0)
EOS PCT: 7.1 % — AB (ref 0.0–5.0)
Eosinophils Absolute: 0.7 10*3/uL (ref 0.0–0.7)
HEMATOCRIT: 42.7 % (ref 39.0–52.0)
Hemoglobin: 14.1 g/dL (ref 13.0–17.0)
LYMPHS PCT: 17.5 % (ref 12.0–46.0)
Lymphs Abs: 1.8 10*3/uL (ref 0.7–4.0)
MCHC: 33 g/dL (ref 30.0–36.0)
MCV: 93 fl (ref 78.0–100.0)
MONOS PCT: 8.2 % (ref 3.0–12.0)
Monocytes Absolute: 0.9 10*3/uL (ref 0.1–1.0)
Neutro Abs: 6.9 10*3/uL (ref 1.4–7.7)
Neutrophils Relative %: 66.4 % (ref 43.0–77.0)
Platelets: 257 10*3/uL (ref 150.0–400.0)
RBC: 4.59 Mil/uL (ref 4.22–5.81)
RDW: 12.9 % (ref 11.5–15.5)
WBC: 10.4 10*3/uL (ref 4.0–10.5)

## 2014-10-26 LAB — LIPID PANEL
CHOL/HDL RATIO: 4
Cholesterol: 134 mg/dL (ref 0–200)
HDL: 37 mg/dL — AB (ref >39.00)
LDL CALC: 84 mg/dL (ref 0–99)
NONHDL: 97.37
Triglycerides: 67 mg/dL (ref 0.0–149.0)
VLDL: 13.4 mg/dL (ref 0.0–40.0)

## 2014-10-26 LAB — COMPREHENSIVE METABOLIC PANEL
ALBUMIN: 4.3 g/dL (ref 3.5–5.2)
ALK PHOS: 67 U/L (ref 39–117)
ALT: 19 U/L (ref 0–53)
AST: 16 U/L (ref 0–37)
BUN: 22 mg/dL (ref 6–23)
CHLORIDE: 104 meq/L (ref 96–112)
CO2: 30 mEq/L (ref 19–32)
Calcium: 9.6 mg/dL (ref 8.4–10.5)
Creatinine, Ser: 0.93 mg/dL (ref 0.40–1.50)
GFR: 87.14 mL/min (ref >60.00)
GLUCOSE: 94 mg/dL (ref 70–99)
POTASSIUM: 4.2 meq/L (ref 3.5–5.1)
SODIUM: 142 meq/L (ref 135–145)
TOTAL PROTEIN: 6.8 g/dL (ref 6.0–8.3)
Total Bilirubin: 1.2 mg/dL (ref 0.2–1.2)

## 2014-10-26 LAB — VITAMIN D 25 HYDROXY (VIT D DEFICIENCY, FRACTURES): VITD: 38.9 ng/mL (ref 30.00–100.00)

## 2014-10-26 MED ORDER — METHYLPREDNISOLONE ACETATE 40 MG/ML IJ SUSP
40.0000 mg | Freq: Once | INTRAMUSCULAR | Status: AC
  Administered 2014-10-26: 40 mg via INTRAMUSCULAR

## 2014-10-26 MED ORDER — HYDROCOD POLST-CPM POLST ER 10-8 MG/5ML PO SUER: 5.0000 mL | Freq: Every evening | ORAL | Status: DC | PRN

## 2014-10-26 MED ORDER — LEVOFLOXACIN 500 MG PO TABS: 500.0000 mg | ORAL_TABLET | Freq: Every day | ORAL | Status: DC

## 2014-10-26 MED ORDER — PREDNISONE 10 MG PO TABS: ORAL_TABLET | ORAL | Status: DC

## 2014-10-26 MED ORDER — LEVALBUTEROL HCL 0.63 MG/3ML IN NEBU: 0.6300 mg | INHALATION_SOLUTION | RESPIRATORY_TRACT | Status: DC | PRN

## 2014-10-26 NOTE — Progress Notes (Signed)
Pre-visit discussion using our clinic review tool. No additional management support is needed unless otherwise documented below in the visit note.  

## 2014-10-26 NOTE — Assessment & Plan Note (Signed)
Discussed current controversy regarding prolonged use of PPI in patients without documented Barretts esophagus.  Patient has no prior EGD but has been on PPI therapy for > 5 years (per patient).  Does not want to change therapy given severity of symptoms  With nocturnal aspiration

## 2014-10-26 NOTE — Progress Notes (Signed)
Subjective:  Patient ID: Charles Mccann, male    DOB: 06-May-1950  Age: 64 y.o. MRN: [id number]  CC: The primary encounter diagnosis was Acute upper respiratory infection. Diagnoses of Hyperlipidemia, Vitamin D deficiency, Long-term use of high-risk medication, Bronchitis, chronic obstructive, with exacerbation, Gastroesophageal reflux disease with esophagitis, Asthma, mild intermittent, uncomplicated, Essential hypertension, and Hyperlipidemia with target LDL less than 100 were also pertinent to this visit.  HPI Charles Mccann presents for 6 month follow up on hypertension, hyperlipidemia, intermittent asthma managed by Raul Del (seen in July) and OSA on CPAP   Patient is taking his medications as prescribed and notes no adverse effects.  Home BP readings have been done about once per week and are  generally < 130/80 .  he is avoiding added salt in his diet and walking regularly about 3 times per week for exercise  .  WANTS FLU SHOT TODAY.   Postponed due to asthma exacerbation ,  For 2 weeks   [place]  returned form 2 week driving  [place]  No flying   No cave exploring,  Stayed in hotels.  Caught a cold while out there,  Having mild facialnnpain in the ethhmoid area and puruluent sputum at times  With persistent cough,  No fever.  Has not had to use nebs   Outpatient Prescriptions Prior to Visit  Medication Sig Dispense Refill  . albuterol (PROVENTIL HFA;VENTOLIN HFA) 108 (90 BASE) MCG/ACT inhaler Inhale 2 puffs into the lungs every 6 (six) hours as needed.    Marland Kitchen atorvastatin (LIPITOR) 10 MG tablet Take 1 tablet (10 mg total) by mouth daily. 90 tablet 1  . budesonide-formoterol (SYMBICORT) 160-4.5 MCG/ACT inhaler Inhale 2 puffs into the lungs 2 (two) times daily.    Marland Kitchen esomeprazole (NEXIUM) 40 MG capsule Take 40 mg by mouth daily before breakfast.    . fluticasone (FLONASE) 50 MCG/ACT nasal spray Place 2 sprays into the nose daily.    . montelukast (SINGULAIR)  10 MG tablet Take 10 mg by mouth at bedtime.    . predniSONE (STERAPRED UNI-PAK) 10 MG tablet 6 tablets on Day 1 , then reduce by 1 tablet daily until gone 21 tablet 0  . VIAGRA 100 MG tablet TAKE 1 TABLET DAILY AS NEEDED 90 tablet 1  . hydrochlorothiazide (HYDRODIURIL) 25 MG tablet TAKE 1 TABLET DAILY 90 tablet 0  . albuterol (PROVENTIL) (2.5 MG/3ML) 0.083% nebulizer solution Take 3 mLs (2.5 mg total) by nebulization every 6 (six) hours as needed for wheezing. 75 mL 12  . levalbuterol (XOPENEX) 0.63 MG/3ML nebulizer solution Take 3 mLs (0.63 mg total) by nebulization every 4 (four) hours as needed for wheezing. 3 mL 6   No facility-administered medications prior to visit.    Review of Systems;  Patient denies headache, fevers, malaise, unintentional weight loss, skin rash, eye pain, sinus congestion and sinus pain, sore throat, dysphagia,  hemoptysis , cough, dyspnea, wheezing, chest pain, palpitations, orthopnea, edema, abdominal pain, nausea, melena, diarrhea, constipation, flank pain, dysuria, hematuria, urinary  Frequency, nocturia, numbness, tingling, seizures,  Focal weakness, Loss of consciousness,  Tremor, insomnia, depression, anxiety, and suicidal ideation.      Objective:  BP 130/80 mmHg  Pulse 61  Temp(Src) 98.4 F (36.9 C) (Oral)  Resp 14  Ht 6' (1.829 m)  Wt 212 lb (96.163 kg)  BMI 28.75 kg/m2  SpO2 96%  BP Readings from Last 3 Encounters:  10/26/14 130/80  04/24/14 110/76  02/27/13 134/84    Wt Readings from Last 3 Encounters:  10/26/14 212 lb (96.163 kg)  04/24/14 215 lb 4 oz (97.637 kg)  02/27/13 215 lb 12 oz (97.864 kg)    General appearance: alert, cooperative and appears stated age Ears: normal TM's and external ear canals both ears Throat: lips, mucosa, and tongue normal; teeth and gums normal Neck: no adenopathy, no carotid bruit, supple, symmetrical, trachea midline and thyroid not enlarged, symmetric, no tenderness/mass/nodules Back: symmetric, no  curvature. ROM normal. No CVA tenderness. Lungs:  Bilateral ronchi and wheezing ,   Good air movement  Heart: regular rate and rhythm, S1, S2 normal, no murmur, click, rub or gallop Abdomen: soft, non-tender; bowel sounds normal; no masses,  no organomegaly Pulses: 2+ and symmetric Skin: Skin color, texture, turgor normal. No rashes or lesions Lymph nodes: Cervical, supraclavicular, and axillary nodes normal.  No results found for: HGBA1C  Lab Results  Component Value Date   CREATININE 0.93 10/26/2014   CREATININE 1.02 04/24/2014   CREATININE 1.1 02/27/2013    Lab Results  Component Value Date   WBC 10.4 10/26/2014   HGB 14.1 10/26/2014   HCT 42.7 10/26/2014   PLT 257.0 10/26/2014   GLUCOSE 94 10/26/2014   CHOL 134 10/26/2014   TRIG 67.0 10/26/2014   HDL 37.00* 10/26/2014   LDLDIRECT 73.6 11/27/2011   LDLCALC 84 10/26/2014   ALT 19 10/26/2014   AST 16 10/26/2014   NA 142 10/26/2014   K 4.2 10/26/2014   CL 104 10/26/2014   CREATININE 0.93 10/26/2014   BUN 22 10/26/2014   CO2 30 10/26/2014   TSH 3.24 04/24/2014   PSA 1.71 04/24/2014   MICROALBUR 0.4 01/09/2013    No results found.  Assessment & Plan:   Problem List Items Addressed This Visit      Unprioritized   Hyperlipidemia with target LDL less than 100     his lipids are well controlled on current statin therapy and his liver enzymes are normal , no changes today.  Lab Results  Component Value Date   CHOL 134 10/26/2014   HDL 37.00* 10/26/2014   LDLCALC 84 10/26/2014   LDLDIRECT 73.6 11/27/2011   TRIG 67.0 10/26/2014   CHOLHDL 4 10/26/2014   Lab Results  Component Value Date   ALT 19 10/26/2014   AST 16 10/26/2014   ALKPHOS 67 10/26/2014   BILITOT 1.2 10/26/2014           Hypertension    Well controlled on current regimen. Renal function stable, no changes today.  Lab Results  Component Value Date   CREATININE 0.93 10/26/2014   Lab Results  Component Value Date   NA 142 10/26/2014    K 4.2 10/26/2014   CL 104 10/26/2014   CO2 30 10/26/2014         GERD (gastroesophageal reflux disease)    Discussed current controversy regarding prolonged use of PPI in patients without documented Barretts esophagus.  Patient has no prior EGD but has been on PPI therapy for > 5 years (per patient).  Does not want to change therapy given severity of symptoms  With nocturnal aspiration       Bronchitis, chronic obstructive, with exacerbation    Triggered by viral URI.  Now with cough,  Sinus congesiton purulent sputum,  No fevers.  Given Depo medrol 40, mg.  Oral  Prednisone taper,  Refill zopemex  Given purulent sputum at times,  Advised to ADD ABX  IN  FEW  DAYS IF NO IMPROVEMENT,  PROBIOTIC ADVISED      Relevant Medications   predniSONE (DELTASONE) 10 MG tablet   chlorpheniramine-HYDROcodone (TUSSIONEX PENNKINETIC ER) 10-8 MG/5ML SUER   levalbuterol (XOPENEX) 0.63 MG/3ML nebulizer solution   methylPREDNISolone acetate (DEPO-MEDROL) injection 40 mg (Completed)    Other Visit Diagnoses    Acute upper respiratory infection    -  Primary    Relevant Medications    methylPREDNISolone acetate (DEPO-MEDROL) injection 40 mg (Completed)    Other Relevant Orders    CBC with Differential/Platelet (Completed)    Hyperlipidemia        Relevant Orders    Lipid panel (Completed)    Vitamin D deficiency        Relevant Orders    Vit D  25 hydroxy (rtn osteoporosis monitoring) (Completed)    Long-term use of high-risk medication        Relevant Orders    Comprehensive metabolic panel (Completed)    Asthma, mild intermittent, uncomplicated        Relevant Medications    predniSONE (DELTASONE) 10 MG tablet    levalbuterol (XOPENEX) 0.63 MG/3ML nebulizer solution    methylPREDNISolone acetate (DEPO-MEDROL) injection 40 mg (Completed)       I have discontinued Mr. Deines's hydrochlorothiazide. I am also having him start on levofloxacin, predniSONE, and chlorpheniramine-HYDROcodone.  Additionally, I am having him maintain his esomeprazole, montelukast, fluticasone, albuterol, albuterol, VIAGRA, budesonide-formoterol, predniSONE, atorvastatin, and levalbuterol. We administered methylPREDNISolone acetate.  Meds ordered this encounter  Medications  . levofloxacin (LEVAQUIN) 500 MG tablet    Sig: Take 1 tablet (500 mg total) by mouth daily.    Dispense:  7 tablet    Refill:  0  . predniSONE (DELTASONE) 10 MG tablet    Sig: 6 tablets on Day 1 , then reduce by 1 tablet daily until gone    Dispense:  21 tablet    Refill:  0  . chlorpheniramine-HYDROcodone (TUSSIONEX PENNKINETIC ER) 10-8 MG/5ML SUER    Sig: Take 5 mLs by mouth at bedtime as needed for cough.    Dispense:  140 mL    Refill:  0  . levalbuterol (XOPENEX) 0.63 MG/3ML nebulizer solution    Sig: Take 3 mLs (0.63 mg total) by nebulization every 4 (four) hours as needed for wheezing.    Dispense:  3 mL    Refill:  0  . methylPREDNISolone acetate (DEPO-MEDROL) injection 40 mg    Sig:     Medications Discontinued During This Encounter  Medication Reason  . hydrochlorothiazide (HYDRODIURIL) 25 MG tablet Change in therapy  . levalbuterol (XOPENEX) 0.63 MG/3ML nebulizer solution Reorder    Follow-up: Return in about 6 months (around 04/25/2015) for annual exam 30 fating labs prior .   Crecencio Mc, MD

## 2014-10-26 NOTE — Patient Instructions (Addendum)
You have a viral infection complicated by bronchitis.  I am prescribing a cough suppressant both inliquid form to manage your cough:  Tussionex   at night for the cough and Delsym during the day for cough :   I also advise use of the following OTC meds to help with your other symptoms.   Take generic OTC benadryl 25 mg every 8 hours for the drainage,  Sudafed PE  10 to 30 mg every 8 hours for the congestion, you may substitute Afrin nasal spray for the nighttime dose of sudafed PE  If needed to prevent insomnia.  flush your sinuses twice daily with eil Meds Sinus rinse (do over the sink because if you do it right you will spit out globs of mucus)  Gargle with salt water as needed for sore throat.   Add the Levaquin in a few days if no improvement in sputum production or if you develop a  fever (t > 100.4) return.     Please take a probiotic ( Align, Floraque or Culturelle) of the generic version of one of these  For a minimum of 3 weeks to prevent a serious antibiotic associated diarrhea  Called clostridium dificile colitis

## 2014-10-26 NOTE — Assessment & Plan Note (Signed)
Triggered by viral URI.  Now with cough,  Sinus congesiton purulent sputum,  No fevers.  Given Depo medrol 40, mg.  Oral  Prednisone taper,  Refill zopemex  Given purulent sputum at times,  Advised to ADD ABX  IN  FEW DAYS IF NO IMPROVEMENT,  PROBIOTIC ADVISED

## 2014-10-27 NOTE — Assessment & Plan Note (Signed)
his lipids are well controlled on current statin therapy and his liver enzymes are normal , no changes today.  Lab Results  Component Value Date   CHOL 134 10/26/2014   HDL 37.00* 10/26/2014   LDLCALC 84 10/26/2014   LDLDIRECT 73.6 11/27/2011   TRIG 67.0 10/26/2014   CHOLHDL 4 10/26/2014   Lab Results  Component Value Date   ALT 19 10/26/2014   AST 16 10/26/2014   ALKPHOS 67 10/26/2014   BILITOT 1.2 10/26/2014

## 2014-10-27 NOTE — Assessment & Plan Note (Signed)
Well controlled on current regimen. Renal function stable, no changes today.  Lab Results  Component Value Date   CREATININE 0.93 10/26/2014   Lab Results  Component Value Date   NA 142 10/26/2014   K 4.2 10/26/2014   CL 104 10/26/2014   CO2 30 10/26/2014

## 2014-10-29 ENCOUNTER — Encounter: Payer: Self-pay | Admitting: Internal Medicine

## 2014-11-13 ENCOUNTER — Encounter: Payer: Self-pay | Admitting: *Deleted

## 2014-11-15 NOTE — H&P (Signed)
See scanned H&P

## 2014-11-16 ENCOUNTER — Encounter: Payer: Self-pay | Admitting: *Deleted

## 2014-11-16 ENCOUNTER — Ambulatory Visit: Payer: 59 | Admitting: *Deleted

## 2014-11-16 ENCOUNTER — Encounter
Admission: RE | Disposition: A | Discharge status: Home / Self Care | Payer: Self-pay | Source: Ambulatory Visit | Attending: Ophthalmology

## 2014-11-16 ENCOUNTER — Ambulatory Visit
Admission: RE | Admit: 2014-11-16 | Discharge: 2014-11-16 | Disposition: A | Discharge status: Home / Self Care | Payer: 59 | Source: Ambulatory Visit | Attending: Ophthalmology | Admitting: Ophthalmology

## 2014-11-16 DIAGNOSIS — J449 Chronic obstructive pulmonary disease, unspecified: Secondary | ICD-10-CM | POA: Insufficient documentation

## 2014-11-16 DIAGNOSIS — G4730 Sleep apnea, unspecified: Secondary | ICD-10-CM | POA: Insufficient documentation

## 2014-11-16 DIAGNOSIS — J45909 Unspecified asthma, uncomplicated: Secondary | ICD-10-CM | POA: Insufficient documentation

## 2014-11-16 DIAGNOSIS — R011 Cardiac murmur, unspecified: Secondary | ICD-10-CM | POA: Diagnosis not present

## 2014-11-16 DIAGNOSIS — K219 Gastro-esophageal reflux disease without esophagitis: Secondary | ICD-10-CM | POA: Diagnosis not present

## 2014-11-16 DIAGNOSIS — E7800 Pure hypercholesterolemia, unspecified: Secondary | ICD-10-CM | POA: Insufficient documentation

## 2014-11-16 DIAGNOSIS — H2511 Age-related nuclear cataract, right eye: Secondary | ICD-10-CM | POA: Insufficient documentation

## 2014-11-16 HISTORY — DX: Pure hypercholesterolemia, unspecified: E78.00

## 2014-11-16 HISTORY — DX: Cardiac murmur, unspecified: R01.1

## 2014-11-16 HISTORY — DX: Tetralogy of Fallot: Q21.3

## 2014-11-16 HISTORY — PX: CATARACT EXTRACTION W/PHACO: SHX586

## 2014-11-16 SURGERY — PHACOEMULSIFICATION, CATARACT, WITH IOL INSERTION
Anesthesia: Monitor Anesthesia Care | Site: Eye | Laterality: Right | Wound class: Clean

## 2014-11-16 MED ORDER — ALFENTANIL 500 MCG/ML IJ INJ
INJECTION | INTRAMUSCULAR | Status: DC | PRN
  Administered 2014-11-16: 250 ug via INTRAVENOUS
  Administered 2014-11-16: 750 ug via INTRAVENOUS

## 2014-11-16 MED ORDER — PHENYLEPHRINE HCL 10 % OP SOLN
1.0000 [drp] | OPHTHALMIC | Status: AC
  Administered 2014-11-16: 1 [drp] via OPHTHALMIC
  Administered 2014-11-16: 08:00:00 via OPHTHALMIC
  Administered 2014-11-16 (×2): 1 [drp] via OPHTHALMIC

## 2014-11-16 MED ORDER — MOXIFLOXACIN HCL 0.5 % OP SOLN
1.0000 [drp] | OPHTHALMIC | Status: AC
  Administered 2014-11-16: 08:00:00 via OPHTHALMIC
  Administered 2014-11-16 (×2): 1 [drp] via OPHTHALMIC

## 2014-11-16 MED ORDER — TETRACAINE HCL 0.5 % OP SOLN
OPHTHALMIC | Status: AC
  Filled 2014-11-16: qty 2

## 2014-11-16 MED ORDER — CEFUROXIME OPHTHALMIC INJECTION 1 MG/0.1 ML
INJECTION | OPHTHALMIC | Status: AC
Start: 2014-11-16 — End: 2014-11-16
  Filled 2014-11-16: qty 0.1

## 2014-11-16 MED ORDER — PHENYLEPHRINE HCL 10 % OP SOLN
OPHTHALMIC | Status: AC
  Filled 2014-11-16: qty 5

## 2014-11-16 MED ORDER — CEFUROXIME OPHTHALMIC INJECTION 1 MG/0.1 ML
INJECTION | OPHTHALMIC | Status: DC | PRN
  Administered 2014-11-16: 0.1 mL via OPHTHALMIC

## 2014-11-16 MED ORDER — LIDOCAINE HCL (PF) 4 % IJ SOLN
INTRAMUSCULAR | Status: DC | PRN
  Administered 2014-11-16: 5 mL via OPHTHALMIC

## 2014-11-16 MED ORDER — EPINEPHRINE HCL 1 MG/ML IJ SOLN
INTRAMUSCULAR | Status: AC
  Filled 2014-11-16: qty 2

## 2014-11-16 MED ORDER — SODIUM CHLORIDE 0.9 % IV SOLN
INTRAVENOUS | Status: DC
  Administered 2014-11-16 (×2): via INTRAVENOUS

## 2014-11-16 MED ORDER — NA CHONDROIT SULF-NA HYALURON 40-17 MG/ML IO SOLN
INTRAOCULAR | Status: AC
Start: 2014-11-16 — End: 2014-11-16
  Filled 2014-11-16: qty 1

## 2014-11-16 MED ORDER — EPINEPHRINE HCL 1 MG/ML IJ SOLN
INTRAOCULAR | Status: DC | PRN
  Administered 2014-11-16: 200 mL via OPHTHALMIC

## 2014-11-16 MED ORDER — MOXIFLOXACIN HCL 0.5 % OP SOLN
OPHTHALMIC | Status: DC | PRN
  Administered 2014-11-16: 1 [drp] via OPHTHALMIC

## 2014-11-16 MED ORDER — CYCLOPENTOLATE HCL 2 % OP SOLN
1.0000 [drp] | OPHTHALMIC | Status: AC
  Administered 2014-11-16 (×3): 1 [drp] via OPHTHALMIC
  Administered 2014-11-16: 08:00:00 via OPHTHALMIC

## 2014-11-16 MED ORDER — CYCLOPENTOLATE HCL 2 % OP SOLN
OPHTHALMIC | Status: AC
  Filled 2014-11-16: qty 2

## 2014-11-16 MED ORDER — CARBACHOL 0.01 % IO SOLN
INTRAOCULAR | Status: DC | PRN
  Administered 2014-11-16: 0.5 mL via INTRAOCULAR

## 2014-11-16 MED ORDER — TETRACAINE HCL 0.5 % OP SOLN
OPHTHALMIC | Status: DC | PRN
Start: 2014-11-16 — End: 2014-11-16
  Administered 2014-11-16: 2 [drp] via OPHTHALMIC

## 2014-11-16 MED ORDER — MIDAZOLAM HCL 2 MG/2ML IJ SOLN
INTRAMUSCULAR | Status: DC | PRN
  Administered 2014-11-16: 0.5 mg via INTRAVENOUS
  Administered 2014-11-16: 1 mg via INTRAVENOUS
  Administered 2014-11-16: .5 mg via INTRAVENOUS

## 2014-11-16 MED ORDER — LIDOCAINE HCL (PF) 4 % IJ SOLN
INTRAMUSCULAR | Status: AC
Start: 2014-11-16 — End: 2014-11-16
  Filled 2014-11-16: qty 5

## 2014-11-16 MED ORDER — NA CHONDROIT SULF-NA HYALURON 40-17 MG/ML IO SOLN
INTRAOCULAR | Status: DC | PRN
  Administered 2014-11-16: 1 mL via INTRAOCULAR

## 2014-11-16 MED ORDER — HYALURONIDASE HUMAN 150 UNIT/ML IJ SOLN
INTRAMUSCULAR | Status: AC
  Filled 2014-11-16: qty 1

## 2014-11-16 MED ORDER — BUPIVACAINE HCL (PF) 0.75 % IJ SOLN
INTRAMUSCULAR | Status: AC
Start: 2014-11-16 — End: 2014-11-16
  Filled 2014-11-16: qty 10

## 2014-11-16 MED ORDER — LIDOCAINE HCL (PF) 4 % IJ SOLN
INTRAOCULAR | Status: DC | PRN
  Administered 2014-11-16: 4 mL via OPHTHALMIC

## 2014-11-16 MED ORDER — MOXIFLOXACIN HCL 0.5 % OP SOLN
OPHTHALMIC | Status: AC
  Filled 2014-11-16: qty 3

## 2014-11-16 SURGICAL SUPPLY — 29 items

## 2014-11-16 NOTE — Discharge Instructions (Addendum)
See handout. Eye Surgery Discharge Instructions  Expect mild scratchy sensation or mild soreness. DO NOT RUB YOUR EYE!  The day of surgery:  Minimal physical activity, but bed rest is not required  No reading, computer work, or close hand work  No bending, lifting, or straining.  May watch TV  For 24 hours:  No driving, legal decisions, or alcoholic beverages  Safety precautions  Eat anything you prefer: It is better to start with liquids, then soup then solid foods.  _____ Eye patch should be worn until postoperative exam tomorrow.  ____ Solar shield eyeglasses should be worn for comfort in the sunlight/patch while sleeping  Resume all regular medications including aspirin or Coumadin if these were discontinued prior to surgery. You may shower, bathe, shave, or wash your hair. Tylenol may be taken for mild discomfort.  Call your doctor if you experience significant pain, nausea, or vomiting, fever > 101 or other signs of infection. [phone] or [phone] Specific instructions:  Follow-up Information    Follow up with DINGELDEIN,STEVEN, MD In 1 day.   Specialty:  Ophthalmology   Why:  9:55   Contact information:   [address]   [place] [address] [phone]

## 2014-11-16 NOTE — Op Note (Signed)
Date of Surgery: 11/16/2014 Date of Dictation: 11/16/2014 9:50 AM Pre-operative Diagnosis:  Nuclear Sclerotic Cataract right Eye Post-operative Diagnosis: same Procedure performed: Extra-capsular Cataract Extraction (ECCE) with placement of a posterior chamber intraocular lens (IOL) right Eye IOL:  Implant Name Type Inv. Item Serial No. Manufacturer Lot No. LRB No. Used  LENS IOL ACRYSOF IQ 20.0 - [id number] Intraocular Lens LENS IOL ACRYSOF IQ 20.0 [id number] ALCON   Right 1   Anesthesia: 2% Lidocaine and 4% Marcaine in a 50/50 mixture with 10 unites/ml of Hylenex given as a peribulbar Anesthesiologist: Anesthesiologist: Elyse Hsu, MD CRNA: Aline Brochure, CRNA; Allean Found, CRNA Complications: none Estimated Blood Loss: less than 1 ml  Description of procedure:  The patient was given anesthesia and sedation via intravenous access. The patient was then prepped and draped in the usual fashion. A 25-gauge needle was bent for initiating the capsulorhexis. A 5-0 silk suture was placed through the conjunctiva superior and inferiorly to serve as bridle sutures. Hemostasis was obtained at the superior limbus using an eraser cautery. A partial thickness groove was made at the anterior surgical limbus with a 64 Beaver blade and this was dissected anteriorly with an Avaya. The anterior chamber was entered at 10 o'clock with a 1.0 mm paracentesis knife and through the lamellar dissection with a 2.6 mm Alcon keratome. Epi-Shugarcaine 0.5 CC [9 cc BSS Plus (Alcon), 3 cc 4% preservative-free lidocaine (Hospira) and 4 cc 1:1000 preservative-free, bisulfite-free epinephrine] was injected into the anterior chamber via the paracentesis tract. Epi-Shugarcaine 0.5 CC [9 cc BSS Plus (Alcon), 3 cc 4% preservative-free lidocaine (Hospira) and 4 cc 1:1000 preservative-free, bisulfite-free epinephrine] was injected into the anterior chamber via the paracentesis tract. DiscoVisc was injected to replace  the aqueous and a continuous tear curvilinear capsulorhexis was performed using a bent 25-gauge needle.  Balance salt on a syringe was used to perform hydro-dissection and phacoemulsification was carried out using a divide and conquer technique. Procedure(s) with comments: CATARACT EXTRACTION PHACO AND INTRAOCULAR LENS PLACEMENT (IOC) (Right) - SJG#[id number] H US:01:03.9 AP%:24.3% CDE:30.12. Irrigation/aspiration was used to remove the residual cortex and the capsular bag was inflated with DiscoVisc. There was a small amount of cortex on the back of the capsule that could not be vacuumed. I looked for a rent in capsule and did not see one. Several attempts to vacuum the capsule were made and given concerns about breaking the capsule the decision was made to fill the bag, inspect it and insert the intraocular lens. The intraocular lens was inserted into the capsular bag using a pre-loaded UltraSert Delivery System. Irrigation/aspiration was used to remove the residual DiscoVisc. The wound was inflated with balanced salt and checked for leaks. None were found. Miostat was injected via the paracentesis track and 0.1 ml of cefuroxime containing 1 mg of drug  was injected via the paracentesis track. The wound was checked for leaks again and none were found.   The bridal sutures were removed and two drops of Vigamox were placed on the eye. An eye shield was placed to protect the eye and the patient was discharged to the recovery area in good condition.   Kyler Lerette MD

## 2014-11-16 NOTE — Transfer of Care (Signed)
Immediate Anesthesia Transfer of Care Note  Patient: Charles Mccann  Procedure(s) Performed: Procedure(s) with comments: CATARACT EXTRACTION PHACO AND INTRAOCULAR LENS PLACEMENT (IOC) (Right) - FQM#[id number] H US:01:03.9 AP%:24.3% CDE:30.12  Patient Location: PACU  Anesthesia Type:MAC  Level of Consciousness: awake  Airway & Oxygen Therapy: Patient Spontanous Breathing  Post-op Assessment: Report given to RN  Post vital signs: Reviewed and stable  Last Vitals:  Filed Vitals:   11/16/14 0735  BP: 136/90  Pulse: 63  Temp: 36.7 C  Resp: 14    Complications: No apparent anesthesia complications

## 2014-11-16 NOTE — OR Nursing (Signed)
Dr. Benjamine Mola at bedside; patient request "I do not need an ecg".  Dr. Benjamine Mola states no need for an ekg at this time.

## 2014-11-16 NOTE — Anesthesia Procedure Notes (Signed)
Procedure Name: MAC Date/Time: 11/16/2014 9:27 AM Performed by: Allean Found Pre-anesthesia Checklist: Patient identified, Emergency Drugs available, Suction available, Patient being monitored and Timeout performed Oxygen Delivery Method: Nasal cannula

## 2014-11-16 NOTE — Interval H&P Note (Signed)
History and Physical Interval Note:  11/16/2014 9:03 AM  Charles Mccann  has presented today for surgery, with the diagnosis of CATARACT  The various methods of treatment have been discussed with the patient and family. After consideration of risks, benefits and other options for treatment, the patient has consented to  Procedure(s): CATARACT EXTRACTION PHACO AND INTRAOCULAR LENS PLACEMENT ([place]) (Right) as a surgical intervention .  The patient's history has been reviewed, patient examined, no change in status, stable for surgery.  I have reviewed the patient's chart and labs.  Questions were answered to the patient's satisfaction.     Charles Mccann

## 2014-11-16 NOTE — Anesthesia Preprocedure Evaluation (Signed)
Anesthesia Evaluation  Patient identified by MRN, date of birth, ID band Patient awake    Reviewed: Allergy & Precautions, NPO status , Patient's Chart, lab work & pertinent test results  Airway Mallampati: II  TM Distance: >3 FB Neck ROM: Limited    Dental  (+) Teeth Intact   Pulmonary asthma , sleep apnea and Continuous Positive Airway Pressure Ventilation ,    Pulmonary exam normal        Cardiovascular Exercise Tolerance: Good hypertension, Pt. on medications  Rhythm:Regular Rate:Normal  Repaired Tetrology of Fallot in 1974--has done well, had a negative treadmill stress test recently and was told by his cardiologist that he need not come back. Goes to the gym--good exercise tolerance. He does have a systolic ejection murmur.   Neuro/Psych    GI/Hepatic GERD  Medicated and Controlled,  Endo/Other    Renal/GU      Musculoskeletal   Abdominal (+)  Abdomen: soft.    Peds  Hematology   Anesthesia Other Findings   Reproductive/Obstetrics                             Anesthesia Physical Anesthesia Plan  ASA: III  Anesthesia Plan: MAC   Post-op Pain Management:    Induction: Intravenous  Airway Management Planned: Nasal Cannula  Additional Equipment:   Intra-op Plan:   Post-operative Plan:   Informed Consent: I have reviewed the patients History and Physical, chart, labs and discussed the procedure including the risks, benefits and alternatives for the proposed anesthesia with the patient or authorized representative who has indicated his/her understanding and acceptance.     Plan Discussed with: CRNA  Anesthesia Plan Comments:         Anesthesia Quick Evaluation

## 2014-11-16 NOTE — Anesthesia Postprocedure Evaluation (Signed)
  Anesthesia Post-op Note  Patient: Charles Mccann  Procedure(s) Performed: Procedure(s) with comments: CATARACT EXTRACTION PHACO AND INTRAOCULAR LENS PLACEMENT (IOC) (Right) - WYS#[id number] H US:01:03.9 AP%:24.3% CDE:30.12  Anesthesia type:MAC  Patient location: PACU  Post pain: Pain level controlled  Post assessment: Post-op Vital signs reviewed, Patient's Cardiovascular Status Stable, Respiratory Function Stable, Patent Airway and No signs of Nausea or vomiting  Post vital signs: Reviewed and stable  Last Vitals:  Filed Vitals:   11/16/14 1017  BP: 143/86  Pulse: 60  Temp: 36.3 C  Resp: 14    Level of consciousness: awake, alert  and patient cooperative  Complications: No apparent anesthesia complications

## 2015-02-09 ENCOUNTER — Other Ambulatory Visit: Payer: Self-pay | Admitting: Internal Medicine

## 2015-02-09 ENCOUNTER — Encounter: Payer: Self-pay | Admitting: Internal Medicine

## 2015-02-11 ENCOUNTER — Ambulatory Visit (INDEPENDENT_AMBULATORY_CARE_PROVIDER_SITE_OTHER): Payer: 59 | Admitting: Family Medicine

## 2015-02-11 ENCOUNTER — Encounter: Payer: Self-pay | Admitting: Family Medicine

## 2015-02-11 VITALS — BP 148/98 | HR 67 | Temp 98.5°F | Ht 72.0 in | Wt 224.5 lb

## 2015-02-11 DIAGNOSIS — J988 Other specified respiratory disorders: Secondary | ICD-10-CM | POA: Diagnosis not present

## 2015-02-11 DIAGNOSIS — J22 Unspecified acute lower respiratory infection: Secondary | ICD-10-CM | POA: Insufficient documentation

## 2015-02-11 MED ORDER — PREDNISONE 50 MG PO TABS: ORAL_TABLET | ORAL | Status: DC

## 2015-02-11 MED ORDER — LEVOFLOXACIN 500 MG PO TABS: 500.0000 mg | ORAL_TABLET | Freq: Every day | ORAL | Status: DC

## 2015-02-11 NOTE — Patient Instructions (Signed)
Take the prednisone as prescribed.  Fill the antibiotic if you fail to improve. Use a probiotic.  Follow up closely with Dr. Derrel Nip  Take care  Dr. Lacinda Axon

## 2015-02-11 NOTE — Progress Notes (Signed)
Pre visit review using our clinic review tool, if applicable. No additional management support is needed unless otherwise documented below in the visit note. 

## 2015-02-11 NOTE — Progress Notes (Signed)
Subjective:  Patient ID: Charles Mccann, male    DOB: Aug 10, 1950  Age: 65 y.o. MRN: [id number]  CC: Cough, shortness of breath  HPI:  65 year old male with a past medical history of asthma presents with the above symptoms.  Patient states that he's been sick for the past 6 weeks. She's been experiencing significant congestion and cough. He also reports associated shortness of breath. He states that when he has a respiratory tract infection it often worsens his asthma. He reports his cough is productive of discolored sputum. He also reports discolored nasal discharge. No known exacerbating or relieving factors. No associated fevers or chills.  Social Hx   Social History   Social History  . Marital Status: Married    Spouse Name: N/A  . Number of Children: N/A  . Years of Education: N/A   Occupational History  . retired Optometrist      travels  to [place] weekly    Social History Main Topics  . Smoking status: Never Smoker   . Smokeless tobacco: Never Used  . Alcohol Use: Yes  . Drug Use: No  . Sexual Activity: Not Asked   Other Topics Concern  . None   Social History Narrative   Review of Systems  Constitutional: Negative for fever.  HENT: Positive for congestion.   Respiratory: Positive for cough and shortness of breath.    Objective:  BP 148/98 mmHg  Pulse 67  Temp(Src) 98.5 F (36.9 C) (Oral)  Ht 6' (1.829 m)  Wt 224 lb 8 oz (101.833 kg)  BMI 30.44 kg/m2  SpO2 96%  BP/Weight 02/11/2015 11/16/2014 [id number]  Systolic BP [id number]  Diastolic BP 98 86 80  Wt. (Lbs) 224.5 212 212  BMI 30.44 28.75 28.75   Physical Exam  Constitutional: He appears well-developed. No distress.  HENT:  Head: Normocephalic and atraumatic.  Oropharynx clear. Normal TMs bilaterally.  Neck: Neck supple.  Cardiovascular: Normal rate and regular rhythm.   Murmur heard. Pulmonary/Chest: Effort normal and breath sounds normal.  Neurological: He is alert.  Vitals reviewed.  Lab  Results  Component Value Date   WBC 10.4 10/26/2014   HGB 14.1 10/26/2014   HCT 42.7 10/26/2014   PLT 257.0 10/26/2014   GLUCOSE 94 10/26/2014   CHOL 134 10/26/2014   TRIG 67.0 10/26/2014   HDL 37.00* 10/26/2014   LDLDIRECT 73.6 11/27/2011   LDLCALC 84 10/26/2014   ALT 19 10/26/2014   AST 16 10/26/2014   NA 142 10/26/2014   K 4.2 10/26/2014   CL 104 10/26/2014   CREATININE 0.93 10/26/2014   BUN 22 10/26/2014   CO2 30 10/26/2014   TSH 3.24 04/24/2014   PSA 1.71 04/24/2014   MICROALBUR 0.4 01/09/2013   Assessment & Plan:   Problem List Items Addressed This Visit    Respiratory tract infection - Primary    New problem. Patient with upper and lower tract symptoms. Given asthma, treating with prednisone. Antibiotic given (to be filled if he fails to improve).         Meds ordered this encounter  Medications  . predniSONE (DELTASONE) 50 MG tablet    Sig: 1 tablet daily x 5 days.    Dispense:  5 tablet    Refill:  0  . levofloxacin (LEVAQUIN) 500 MG tablet    Sig: Take 1 tablet (500 mg total) by mouth daily.    Dispense:  7 tablet    Refill:  0    Follow-up: PRN  [place]

## 2015-02-11 NOTE — Assessment & Plan Note (Signed)
New problem. Patient with upper and lower tract symptoms. Given asthma, treating with prednisone. Antibiotic given (to be filled if he fails to improve).

## 2015-04-08 ENCOUNTER — Encounter: Payer: Self-pay | Admitting: Internal Medicine

## 2015-04-08 ENCOUNTER — Other Ambulatory Visit: Payer: Self-pay | Admitting: Internal Medicine

## 2015-04-08 MED ORDER — ATORVASTATIN CALCIUM 10 MG PO TABS: 10.0000 mg | ORAL_TABLET | Freq: Every day | ORAL | Status: DC

## 2015-09-19 ENCOUNTER — Other Ambulatory Visit: Payer: Self-pay | Admitting: Internal Medicine

## 2015-11-30 ENCOUNTER — Encounter: Payer: Self-pay | Admitting: Internal Medicine

## 2015-11-30 ENCOUNTER — Other Ambulatory Visit: Payer: Self-pay | Admitting: Internal Medicine

## 2015-11-30 NOTE — Telephone Encounter (Signed)
Last labs and OV 9/16 please advise to refill?

## 2015-12-21 ENCOUNTER — Other Ambulatory Visit: Payer: Self-pay | Admitting: Internal Medicine

## 2016-01-18 ENCOUNTER — Encounter: Payer: Self-pay | Admitting: Internal Medicine

## 2016-01-20 ENCOUNTER — Ambulatory Visit: Payer: 59

## 2016-01-21 ENCOUNTER — Encounter: Payer: 59 | Admitting: Internal Medicine

## 2016-02-13 ENCOUNTER — Other Ambulatory Visit: Payer: Self-pay | Admitting: Internal Medicine

## 2016-02-14 NOTE — Telephone Encounter (Signed)
NO lipid since 10/16/14

## 2016-02-15 ENCOUNTER — Other Ambulatory Visit: Payer: Self-pay | Admitting: Internal Medicine

## 2016-02-15 DIAGNOSIS — Z125 Encounter for screening for malignant neoplasm of prostate: Secondary | ICD-10-CM

## 2016-02-15 DIAGNOSIS — Z79899 Other long term (current) drug therapy: Secondary | ICD-10-CM

## 2016-02-15 DIAGNOSIS — E785 Hyperlipidemia, unspecified: Secondary | ICD-10-CM

## 2016-02-15 NOTE — Telephone Encounter (Signed)
No refills until fasting labs done.  psa also ordered.

## 2016-02-23 ENCOUNTER — Other Ambulatory Visit: Payer: 59

## 2016-02-28 ENCOUNTER — Encounter: Payer: Self-pay | Admitting: Internal Medicine

## 2016-02-28 ENCOUNTER — Ambulatory Visit (INDEPENDENT_AMBULATORY_CARE_PROVIDER_SITE_OTHER): Payer: 59 | Admitting: Internal Medicine

## 2016-02-28 VITALS — BP 136/82 | HR 71 | Temp 98.7°F | Resp 16 | Ht 72.0 in | Wt 225.5 lb

## 2016-02-28 DIAGNOSIS — R002 Palpitations: Secondary | ICD-10-CM | POA: Diagnosis not present

## 2016-02-28 DIAGNOSIS — K4090 Unilateral inguinal hernia, without obstruction or gangrene, not specified as recurrent: Secondary | ICD-10-CM

## 2016-02-28 DIAGNOSIS — Z125 Encounter for screening for malignant neoplasm of prostate: Secondary | ICD-10-CM

## 2016-02-28 DIAGNOSIS — E559 Vitamin D deficiency, unspecified: Secondary | ICD-10-CM

## 2016-02-28 DIAGNOSIS — Z1159 Encounter for screening for other viral diseases: Secondary | ICD-10-CM

## 2016-02-28 DIAGNOSIS — Z0000 Encounter for general adult medical examination without abnormal findings: Secondary | ICD-10-CM | POA: Diagnosis not present

## 2016-02-28 DIAGNOSIS — E785 Hyperlipidemia, unspecified: Secondary | ICD-10-CM | POA: Diagnosis not present

## 2016-02-28 DIAGNOSIS — I10 Essential (primary) hypertension: Secondary | ICD-10-CM

## 2016-02-28 DIAGNOSIS — Z79899 Other long term (current) drug therapy: Secondary | ICD-10-CM | POA: Diagnosis not present

## 2016-02-28 DIAGNOSIS — R252 Cramp and spasm: Secondary | ICD-10-CM

## 2016-02-28 DIAGNOSIS — J988 Other specified respiratory disorders: Secondary | ICD-10-CM

## 2016-02-28 DIAGNOSIS — R350 Frequency of micturition: Secondary | ICD-10-CM

## 2016-02-28 LAB — URINALYSIS, ROUTINE W REFLEX MICROSCOPIC
Bilirubin Urine: NEGATIVE
Hgb urine dipstick: NEGATIVE
KETONES UR: NEGATIVE
Leukocytes, UA: NEGATIVE
NITRITE: NEGATIVE
PH: 7 (ref 5.0–8.0)
RBC / HPF: NONE SEEN (ref 0–?)
Total Protein, Urine: NEGATIVE
URINE GLUCOSE: NEGATIVE
Urobilinogen, UA: 0.2 (ref 0.0–1.0)
WBC, UA: NONE SEEN (ref 0–?)

## 2016-02-28 LAB — COMPREHENSIVE METABOLIC PANEL
ALBUMIN: 4.4 g/dL (ref 3.5–5.2)
ALK PHOS: 64 U/L (ref 39–117)
ALT: 18 U/L (ref 0–53)
AST: 19 U/L (ref 0–37)
BUN: 15 mg/dL (ref 6–23)
CALCIUM: 9.6 mg/dL (ref 8.4–10.5)
CHLORIDE: 104 meq/L (ref 96–112)
CO2: 32 mEq/L (ref 19–32)
Creatinine, Ser: 0.95 mg/dL (ref 0.40–1.50)
GFR: 84.66 mL/min (ref >60.00)
Glucose, Bld: 102 mg/dL — ABNORMAL HIGH (ref 70–99)
POTASSIUM: 4.6 meq/L (ref 3.5–5.1)
SODIUM: 142 meq/L (ref 135–145)
TOTAL PROTEIN: 6.9 g/dL (ref 6.0–8.3)
Total Bilirubin: 1 mg/dL (ref 0.2–1.2)

## 2016-02-28 LAB — MAGNESIUM: MAGNESIUM: 1.9 mg/dL (ref 1.5–2.5)

## 2016-02-28 LAB — PSA: PSA: 1.24 ng/mL (ref 0.10–4.00)

## 2016-02-28 LAB — LIPID PANEL
CHOLESTEROL: 145 mg/dL (ref 0–200)
HDL: 44.8 mg/dL (ref >39.00)
LDL CALC: 85 mg/dL (ref 0–99)
NonHDL: 99.74
TRIGLYCERIDES: 75 mg/dL (ref 0.0–149.0)
Total CHOL/HDL Ratio: 3
VLDL: 15 mg/dL (ref 0.0–40.0)

## 2016-02-28 MED ORDER — LEVOFLOXACIN 500 MG PO TABS: 500.0000 mg | ORAL_TABLET | Freq: Every day | ORAL | 0 refills | Status: DC

## 2016-02-28 MED ORDER — BENZONATATE 100 MG PO CAPS: 100.0000 mg | ORAL_CAPSULE | Freq: Two times a day (BID) | ORAL | 0 refills | Status: DC | PRN

## 2016-02-28 MED ORDER — PREDNISONE 10 MG PO TABS: ORAL_TABLET | ORAL | 0 refills | Status: DC

## 2016-02-28 NOTE — Patient Instructions (Addendum)
You have a viral syndrome which is causing bronchitis.    The post nasal drip may also be contributing to  your  Cough.  I am prescribing a prednisone injection  to manage the inflammation in your bronchial tubes   I also advise use of the following OTC meds to help with your other symptoms.   Continue generic OTC benadryl 25 mg  At bedtime for the drainage,,  tessalon perles for daytime cough. flush your sinuses twice daily with Milta Deiters Meds sinus sinse   Your coughing may last 7 to 10 days and yoU ay develop a low grade fever ( T > 100.4,  les than 102) but if you develop Green nasal discharge,  Ear  Or facial pain, start the antibiotic I have given you a prescription for [facility])  Please take a probiotic ( Align, Floraque or Culturelle) if you start the  antibiotic to prevent a serious antibiotic associated diarrhea  Called clostridium dificile colitis and a vaginal yeast infection   Let me know what general surgeon you would like to see abut your inguinal hernia.  (it may require a urology consult, but I'll check once you let me know)   Health Maintenance, Male A healthy lifestyle and preventative care can promote health and wellness.  Maintain regular health, dental, and eye exams.  Eat a healthy diet. Foods like vegetables, fruits, whole grains, low-fat dairy products, and lean protein foods contain the nutrients you need and are low in calories. Decrease your intake of foods high in solid fats, added sugars, and salt. Get information about a proper diet from your health care provider, if necessary.  Regular physical exercise is one of the most important things you can do for your health. Most adults should get at least 150 minutes of moderate-intensity exercise (any activity that increases your heart rate and causes you to sweat) each week. In addition, most adults need muscle-strengthening exercises on 2 or more days a week.   Maintain a healthy weight. The body mass index (BMI) is a  screening tool to identify possible weight problems. It provides an estimate of body fat based on height and weight. Your health care provider can find your BMI and can help you achieve or maintain a healthy weight. For males 20 years and older:  A BMI below 18.5 is considered underweight.  A BMI of 18.5 to 24.9 is normal.  A BMI of 25 to 29.9 is considered overweight.  A BMI of 30 and above is considered obese.  Maintain normal blood lipids and cholesterol by exercising and minimizing your intake of saturated fat. Eat a balanced diet with plenty of fruits and vegetables. Blood tests for lipids and cholesterol should begin at age 74 and be repeated every 5 years. If your lipid or cholesterol levels are high, you are over age 73, or you are at high risk for heart disease, you may need your cholesterol levels checked more frequently.Ongoing high lipid and cholesterol levels should be treated with medicines if diet and exercise are not working.  If you smoke, find out from your health care provider how to quit. If you do not use tobacco, do not start.  Lung cancer screening is recommended for adults aged 39-80 years who are at high risk for developing lung cancer because of a history of smoking. A yearly low-dose CT scan of the lungs is recommended for people who have at least a 30-pack-year history of smoking and are current smokers or have quit within  the past 15 years. A pack year of smoking is smoking an average of 1 pack of cigarettes a day for 1 year (for example, a 30-pack-year history of smoking could mean smoking 1 pack a day for 30 years or 2 packs a day for 15 years). Yearly screening should continue until the smoker has stopped smoking for at least 15 years. Yearly screening should be stopped for people who develop a health problem that would prevent them from having lung cancer treatment.  If you choose to drink alcohol, do not have more than 2 drinks per day. One drink is considered to be  12 oz (360 mL) of beer, 5 oz (150 mL) of wine, or 1.5 oz (45 mL) of liquor.  Avoid the use of street drugs. Do not share needles with anyone. Ask for help if you need support or instructions about stopping the use of drugs.  High blood pressure causes heart disease and increases the risk of stroke. High blood pressure is more likely to develop in:  People who have blood pressure in the end of the normal range (100-139/85-89 mm Hg).  People who are overweight or obese.  People who are African American.  If you are 64-69 years of age, have your blood pressure checked every 3-5 years. If you are 38 years of age or older, have your blood pressure checked every year. You should have your blood pressure measured twice-once when you are at a hospital or clinic, and once when you are not at a hospital or clinic. Record the average of the two measurements. To check your blood pressure when you are not at a hospital or clinic, you can use:  An automated blood pressure machine at a pharmacy.  A home blood pressure monitor.  If you are 98-62 years old, ask your health care provider if you should take aspirin to prevent heart disease.  Diabetes screening involves taking a blood sample to check your fasting blood sugar level. This should be done once every 3 years after age 17 if you are at a normal weight and without risk factors for diabetes. Testing should be considered at a younger age or be carried out more frequently if you are overweight and have at least 1 risk factor for diabetes.  Colorectal cancer can be detected and often prevented. Most routine colorectal cancer screening begins at the age of 26 and continues through age 45. However, your health care provider may recommend screening at an earlier age if you have risk factors for colon cancer. On a yearly basis, your health care provider may provide home test kits to check for hidden blood in the stool. A small camera at the end of a tube may be  used to directly examine the colon (sigmoidoscopy or colonoscopy) to detect the earliest forms of colorectal cancer. Talk to your health care provider about this at age 55 when routine screening begins. A direct exam of the colon should be repeated every 5-10 years through age 11, unless early forms of precancerous polyps or small growths are found.  People who are at an increased risk for hepatitis B should be screened for this virus. You are considered at high risk for hepatitis B if:  You were born in a country where hepatitis B occurs often. Talk with your health care provider about which countries are considered high risk.  Your parents were born in a high-risk country and you have not received a shot to protect against hepatitis B (hepatitis  B vaccine).  You have HIV or AIDS.  You use needles to inject street drugs.  You live with, or have sex with, someone who has hepatitis B.  You are a man who has sex with other men (MSM).  You get hemodialysis treatment.  You take certain medicines for conditions like cancer, organ transplantation, and autoimmune conditions.  Hepatitis C blood testing is recommended for all people born from [birth year] through 1964 and any individual with known risk factors for hepatitis C.  Healthy men should no longer receive prostate-specific antigen (PSA) blood tests as part of routine cancer screening. Talk to your health care provider about prostate cancer screening.  Testicular cancer screening is not recommended for adolescents or adult males who have no symptoms. Screening includes self-exam, a health care provider exam, and other screening tests. Consult with your health care provider about any symptoms you have or any concerns you have about testicular cancer.  Practice safe sex. Use condoms and avoid high-risk sexual practices to reduce the spread of sexually transmitted infections (STIs).  You should be screened for STIs, including gonorrhea and chlamydia  if:  You are sexually active and are younger than 24 years.  You are older than 24 years, and your health care provider tells you that you are at risk for this type of infection.  Your sexual activity has changed since you were last screened, and you are at an increased risk for chlamydia or gonorrhea. Ask your health care provider if you are at risk.  If you are at risk of being infected with HIV, it is recommended that you take a prescription medicine daily to prevent HIV infection. This is called pre-exposure prophylaxis (PrEP). You are considered at risk if:  You are a man who has sex with other men (MSM).  You are a heterosexual man who is sexually active with multiple partners.  You take drugs by injection.  You are sexually active with a partner who has HIV.  Talk with your health care provider about whether you are at high risk of being infected with HIV. If you choose to begin PrEP, you should first be tested for HIV. You should then be tested every 3 months for as long as you are taking PrEP.  Use sunscreen. Apply sunscreen liberally and repeatedly throughout the day. You should seek shade when your shadow is shorter than you. Protect yourself by wearing long sleeves, pants, a wide-brimmed hat, and sunglasses year round whenever you are outdoors.  Tell your health care provider of new moles or changes in moles, especially if there is a change in shape or color. Also, tell your health care provider if a mole is larger than the size of a pencil eraser.  A one-time screening for abdominal aortic aneurysm (AAA) and surgical repair of large AAAs by ultrasound is recommended for men aged 9-75 years who are current or former smokers.  Stay current with your vaccines (immunizations). This information is not intended to replace advice given to you by your health care provider. Make sure you discuss any questions you have with your health care provider. Document Released: 07/22/2007  Document Revised: 02/13/2014 Document Reviewed: 10/27/2014 Elsevier Interactive Patient Education  2017 [employer].

## 2016-02-28 NOTE — Progress Notes (Signed)
Patient ID: Charles Mccann, male    DOB: [birth year]  Age: 66 y.o. MRN: [id number]  The patient is here for his  annualexamination and management of other chronic and acute problems.   SEES FLEMING FOR PULMONARY ISSUES IMNCLOUDIG ASTHAM AND CPAP FLU NOV 2017 PREVNAR vaccine in 2016,   COLONOSCOPY 2015 MULTIPLE POLYPS  Found by ELLIOTT  Repeat is due 2020 LAST EYE EXAM  RECENT RIGHT CATARACT REMOVED. WITH LENS 2016 [facility]   Lab Results  Component Value Date   PSA 1.24 02/28/2016   PSA 1.71 04/24/2014   PSA 1.48 02/27/2013    The risk factors are reflected in the social history.  The roster of all physicians providing medical care to patient - is listed in the Snapshot section of the chart.  Home safety : The patient has smoke detectors in the home. They wear seatbelts.  There are no firearms at home. There is no violence in the home.   There is no risks for hepatitis, STDs or HIV. There is no   history of blood transfusion. They have no travel history to infectious disease endemic areas of the world.  The patient has seen their dentist in the last six month. They have seen their eye doctor in the last year.   They do not  have excessive sun exposure. And has had prior dermatologic evaluations with biopsy of area on lower back done in the past.  Discussed the need for sun protection: hats, long sleeves and use of sunscreen if there is significant sun exposure.   Diet: the importance of a healthy diet is discussed. They do have a healthy diet.  The benefits of regular aerobic exercise were discussed. She walks 4 times per week ,  20 minutes.   Depression screen: there are no signs or vegative symptoms of depression- irritability, change in appetite, anhedonia, sadness/tearfullness.  Cognitive assessment: the patient manages all their financial and personal affairs and is actively engaged. They could relate day,date,year and events; recalled 2/3 objects at 3 minutes; performed  clock-face test normally.  The following portions of the patient's history were reviewed and updated as appropriate: allergies, current medications, past family history, past medical history,  past surgical history, past social history  and problem list.  Visual acuity was not assessed per patient preference since she has regular follow up with her ophthalmologist. Hearing and body mass index were assessed and reviewed.   During the course of the visit the patient was educated and counseled about appropriate screening and preventive services including : fall prevention , diabetes screening, nutrition counseling, colorectal cancer screening, and recommended immunizations.    CC: The primary encounter diagnosis was Vitamin D deficiency. Diagnoses of Muscle cramps, Urinary frequency, Long-term use of high-risk medication, Hyperlipidemia with target LDL less than 100, Prostate cancer screening, Palpitations, Routine general medical examination at a health care facility, Respiratory tract infection, Essential hypertension, and Unilateral inguinal hernia without obstruction or gangrene, recurrence not specified were also pertinent to this visit.  1)  LEFT HIP PAIN, worse in the am.  Improves with stretching and activity.  No radiation ,  No history of falls   2) left inguinal bulge,  Present for several months,  Non tender .  3) recent development of cough and sinus drainage.  Occasional wheezing and rattlling.   Has asthma,  Using inhalers,  Using albuterol once ot=r twice daily.  No fevers or purulenet sputum,  No body aches,  Up to date on flu/penumonia  vaccines.  No recent travel.  Uses cpap and cleans /exchanges taparatus and tubing as directed.  3) had recurrent episodes of what sounds like  PVC'S IN THE FALL,  HAS LOWERED CAFFEINE INTAKE and episodes have not recurred.     History Mataio has a past medical history of Asthma; GERD (gastroesophageal reflux disease); Heart murmur;  Hypercholesterolemia; Hypertension; Obstructive sleep apnea of adult (2012); Tetralogy of Fallot; and Treadmill stress test negative for angina pectoris (2005).   He has a past surgical history that includes Tetralogy of Fallot repair and Cataract extraction w/PHACO (Right, 11/16/2014).   His family history includes Cancer in his brother; Cancer (age of onset: 55) in his brother; Heart disease (age of onset: 84) in his father.He reports that he has never smoked. He has never used smokeless tobacco. He reports that he drinks alcohol. He reports that he does not use drugs.  Outpatient Medications Prior to Visit  Medication Sig Dispense Refill  . albuterol (PROVENTIL HFA;VENTOLIN HFA) 108 (90 BASE) MCG/ACT inhaler Inhale 2 puffs into the lungs every 6 (six) hours as needed.    Marland Kitchen atorvastatin (LIPITOR) 10 MG tablet TAKE 1 TABLET BY MOUTH  DAILY 90 tablet 0  . budesonide-formoterol (SYMBICORT) 160-4.5 MCG/ACT inhaler Inhale 2 puffs into the lungs 2 (two) times daily.    Marland Kitchen esomeprazole (NEXIUM) 40 MG capsule Take 40 mg by mouth daily before breakfast.    . fluticasone (FLONASE) 50 MCG/ACT nasal spray Place 2 sprays into the nose daily.    Marland Kitchen levalbuterol (XOPENEX) 0.63 MG/3ML nebulizer solution Take 3 mLs (0.63 mg total) by nebulization every 4 (four) hours as needed for wheezing. 3 mL 0  . montelukast (SINGULAIR) 10 MG tablet Take 10 mg by mouth at bedtime.    Marland Kitchen VIAGRA 100 MG tablet TAKE 1 TABLET DAILY AS NEEDED 90 tablet 1  . chlorpheniramine-HYDROcodone (TUSSIONEX PENNKINETIC ER) 10-8 MG/5ML SUER Take 5 mLs by mouth at bedtime as needed for cough. 140 mL 0  . levofloxacin (LEVAQUIN) 500 MG tablet Take 1 tablet (500 mg total) by mouth daily. 7 tablet 0  . predniSONE (DELTASONE) 10 MG tablet 6 tablets on Day 1 , then reduce by 1 tablet daily until gone 21 tablet 0  . predniSONE (DELTASONE) 50 MG tablet 1 tablet daily x 5 days. 5 tablet 0  . predniSONE (STERAPRED UNI-PAK) 10 MG tablet 6 tablets on Day 1  , then reduce by 1 tablet daily until gone 21 tablet 0   No facility-administered medications prior to visit.     Review of Systems   Patient denies headache, fevers, malaise, unintentional weight loss, skin rash, eye pain,  sinus pain, sore throat, dysphagia,  hemoptysis ,  dyspnea,  chest pain, palpitations, orthopnea, edema, abdominal pain, nausea, melena, diarrhea, constipation, flank pain, dysuria, hematuria, urinary  Frequency, nocturia, numbness, tingling, seizures,  Focal weakness, Loss of consciousness,  Tremor, insomnia, depression, anxiety, and suicidal ideation.      Objective:  BP 136/82   Pulse 71   Temp 98.7 F (37.1 C) (Oral)   Resp 16   Ht 6' (1.829 m)   Wt 225 lb 8 oz (102.3 kg)   SpO2 97%   BMI 30.58 kg/m   Physical Exam   General appearance: alert, cooperative and appears stated age Ears: normal TM's and external ear canals both ears Throat: lips, mucosa, and tongue normal; teeth and gums normal Neck: no adenopathy, no carotid bruit, supple, symmetrical, trachea midline and thyroid not enlarged, symmetric, no  tenderness/mass/nodules Back: symmetric, no curvature. ROM normal. No CVA tenderness. Lungs: occasional ronchi and wheezing bilaterally.  No egophony Heart: regular rate and rhythm, S1, S2 normal, no murmur, click, rub or gallop Abdomen: soft, non-tender; bowel sounds normal; no masses,  no organomegaly Pulses: 2+ and symmetric Skin: Skin color, texture, turgor normal. No rashes or lesions Inguinal: left inguinal nonreduicble hernia  Lymph nodes: Cervical, supraclavicular, and axillary nodes normal.    Assessment & Plan:   Problem List Items Addressed This Visit    Hyperlipidemia with target LDL less than 100     his lipids are well controlled on current statin therapy and his liver enzymes are normal , no changes today.  Lab Results  Component Value Date   CHOL 145 02/28/2016   HDL 44.80 02/28/2016   LDLCALC 85 02/28/2016   LDLDIRECT 73.6  11/27/2011   TRIG 75.0 02/28/2016   CHOLHDL 3 02/28/2016   Lab Results  Component Value Date   ALT 18 02/28/2016   AST 19 02/28/2016   ALKPHOS 64 02/28/2016   BILITOT 1.0 02/28/2016           Relevant Medications   hydrochlorothiazide (HYDRODIURIL) 25 MG tablet   Hypertension    Persistent, per review of other office notes,  Will recommend hctz  For goal  < 130/80. Renal function and lytes are normal. .  Lab Results  Component Value Date   CREATININE 0.95 02/28/2016   Lab Results  Component Value Date   NA 142 02/28/2016   K 4.6 02/28/2016   CL 104 02/28/2016   CO2 32 02/28/2016         Relevant Medications   hydrochlorothiazide (HYDRODIURIL) 25 MG tablet   Inguinal hernia    Left .  nontender but non reducible. Referral to Elmer Picker ,  [facility] surgical.       Relevant Orders   Ambulatory referral to General Surgery   Palpitations    his previous report of PVC's has resolved with reductio in caffeine intake.   Lab Results  Component Value Date   NA 142 02/28/2016   K 4.6 02/28/2016   CL 104 02/28/2016   CO2 32 02/28/2016          Respiratory tract infection    Given his history of asthma,  Will prescribe prednisone taper and cough suppressant,  Advised to start antibiotic only if fevers develop or cough becomes productive.       Routine general medical examination at a health care facility    Annual comprehensive preventive exam was done as well as an evaluation and management of acute and chronic conditions .  During the course of the visit the patient was educated and counseled about appropriate screening and preventive services including :  diabetes screening, lipid analysis with projected  10 year  risk for CAD , nutrition counseling, prostate and colorectal cancer screening, and recommended immunizations.  Printed recommendations for health maintenance screenings was given.   Lab Results  Component Value Date   PSA 1.24 02/28/2016   PSA 1.71  04/24/2014   PSA 1.48 02/27/2013          Other Visit Diagnoses    Vitamin D deficiency    -  Primary   Relevant Orders   VITAMIN D 25 Hydroxy (Vit-D Deficiency, Fractures) (Completed)   Muscle cramps       Relevant Orders   Magnesium (Completed)   Urinary frequency       Relevant Orders   Urinalysis,  Routine w reflex microscopic (Completed)   POCT urinalysis dipstick   Long-term use of high-risk medication       Prostate cancer screening          I have discontinued Mr. Bloodworth's predniSONE, predniSONE, chlorpheniramine-HYDROcodone, predniSONE, and levofloxacin. I am also having him start on predniSONE, benzonatate, levofloxacin, and hydrochlorothiazide. Additionally, I am having him maintain his esomeprazole, montelukast, fluticasone, albuterol, VIAGRA, budesonide-formoterol, levalbuterol, and atorvastatin.  Meds ordered this encounter  Medications  . predniSONE (DELTASONE) 10 MG tablet    Sig: 6 tablets on Day 1 , then reduce by 1 tablet daily until gone    Dispense:  21 tablet    Refill:  0  . benzonatate (TESSALON) 100 MG capsule    Sig: Take 1 capsule (100 mg total) by mouth 2 (two) times daily as needed for cough.    Dispense:  20 capsule    Refill:  0  . levofloxacin (LEVAQUIN) 500 MG tablet    Sig: Take 1 tablet (500 mg total) by mouth daily.    Dispense:  7 tablet    Refill:  0  . hydrochlorothiazide (HYDRODIURIL) 25 MG tablet    Sig: Take 1 tablet (25 mg total) by mouth daily.    Dispense:  30 tablet    Refill:  0    Medications Discontinued During This Encounter  Medication Reason  . levofloxacin (LEVAQUIN) 500 MG tablet No longer needed (for PRN medications)  . predniSONE (DELTASONE) 10 MG tablet No longer needed (for PRN medications)  . predniSONE (DELTASONE) 50 MG tablet No longer needed (for PRN medications)  . predniSONE (STERAPRED UNI-PAK) 10 MG tablet Error  . chlorpheniramine-HYDROcodone (TUSSIONEX PENNKINETIC ER) 10-8 MG/5ML SUER No longer  needed (for PRN medications)    Follow-up: No Follow-up on file.   Crecencio Mc, MD

## 2016-02-29 ENCOUNTER — Encounter: Payer: Self-pay | Admitting: Internal Medicine

## 2016-02-29 DIAGNOSIS — K4090 Unilateral inguinal hernia, without obstruction or gangrene, not specified as recurrent: Secondary | ICD-10-CM | POA: Insufficient documentation

## 2016-02-29 LAB — VITAMIN D 25 HYDROXY (VIT D DEFICIENCY, FRACTURES): Vit D, 25-Hydroxy: 33 ng/mL (ref 30–100)

## 2016-02-29 MED ORDER — HYDROCHLOROTHIAZIDE 25 MG PO TABS: 25.0000 mg | ORAL_TABLET | Freq: Every day | ORAL | 0 refills | Status: DC

## 2016-02-29 NOTE — Assessment & Plan Note (Signed)
Given his history of asthma,  Will prescribe prednisone taper and cough suppressant,  Advised to start antibiotic only if fevers develop or cough becomes productive.

## 2016-02-29 NOTE — Assessment & Plan Note (Signed)
Annual comprehensive preventive exam was done as well as an evaluation and management of acute and chronic conditions .  During the course of the visit the patient was educated and counseled about appropriate screening and preventive services including :  diabetes screening, lipid analysis with projected  10 year  risk for CAD , nutrition counseling, prostate and colorectal cancer screening, and recommended immunizations.  Printed recommendations for health maintenance screenings was given.   Lab Results  Component Value Date   PSA 1.24 02/28/2016   PSA 1.71 04/24/2014   PSA 1.48 02/27/2013

## 2016-02-29 NOTE — Assessment & Plan Note (Addendum)
Persistent, per review of other office notes,  Will recommend hctz  For goal  < 130/80. Renal function and lytes are normal. .  Lab Results  Component Value Date   CREATININE 0.95 02/28/2016   Lab Results  Component Value Date   NA 142 02/28/2016   K 4.6 02/28/2016   CL 104 02/28/2016   CO2 32 02/28/2016

## 2016-02-29 NOTE — Assessment & Plan Note (Signed)
his lipids are well controlled on current statin therapy and his liver enzymes are normal , no changes today.  Lab Results  Component Value Date   CHOL 145 02/28/2016   HDL 44.80 02/28/2016   LDLCALC 85 02/28/2016   LDLDIRECT 73.6 11/27/2011   TRIG 75.0 02/28/2016   CHOLHDL 3 02/28/2016   Lab Results  Component Value Date   ALT 18 02/28/2016   AST 19 02/28/2016   ALKPHOS 64 02/28/2016   BILITOT 1.0 02/28/2016

## 2016-02-29 NOTE — Assessment & Plan Note (Signed)
Left .  nontender but non reducible. Referral to Elmer Picker ,  [facility] surgical.

## 2016-02-29 NOTE — Assessment & Plan Note (Signed)
his previous report of PVC's has resolved with reductio in caffeine intake.   Lab Results  Component Value Date   NA 142 02/28/2016   K 4.6 02/28/2016   CL 104 02/28/2016   CO2 32 02/28/2016

## 2016-02-29 NOTE — Telephone Encounter (Signed)
Juliann Pulse , can you handle this tomorrow?  patient wasn't CPT code for Hep c screening  and I have no idea what to give him. See e mail

## 2016-03-03 ENCOUNTER — Encounter: Payer: Self-pay | Admitting: Internal Medicine

## 2016-03-06 ENCOUNTER — Encounter: Payer: Self-pay | Admitting: *Deleted

## 2016-03-07 ENCOUNTER — Other Ambulatory Visit: Payer: Self-pay | Admitting: Internal Medicine

## 2016-03-08 ENCOUNTER — Ambulatory Visit: Payer: Self-pay | Admitting: General Surgery

## 2016-03-09 ENCOUNTER — Encounter: Payer: Self-pay | Admitting: General Surgery

## 2016-03-09 ENCOUNTER — Ambulatory Visit (INDEPENDENT_AMBULATORY_CARE_PROVIDER_SITE_OTHER): Payer: 59 | Admitting: General Surgery

## 2016-03-09 VITALS — BP 162/90 | HR 67 | Temp 98.7°F | Ht 72.0 in | Wt 222.0 lb

## 2016-03-09 DIAGNOSIS — K429 Umbilical hernia without obstruction or gangrene: Secondary | ICD-10-CM

## 2016-03-09 DIAGNOSIS — K4090 Unilateral inguinal hernia, without obstruction or gangrene, not specified as recurrent: Secondary | ICD-10-CM

## 2016-03-09 NOTE — Patient Instructions (Addendum)
Please give [place] a call if you have any questions or concerns.   Once you decide on a surgery date, please let us know. If you choose to do it on April 12, 2016, we would have to see you again.

## 2016-03-10 DIAGNOSIS — Z8719 Personal history of other diseases of the digestive system: Secondary | ICD-10-CM | POA: Insufficient documentation

## 2016-03-10 DIAGNOSIS — K429 Umbilical hernia without obstruction or gangrene: Secondary | ICD-10-CM

## 2016-03-10 HISTORY — DX: Umbilical hernia without obstruction or gangrene: K42.9

## 2016-03-10 NOTE — Addendum Note (Signed)
Addended by: Clayburn Pert T on: 03/10/2016 02:12 PM   Modules accepted: Orders, SmartSet

## 2016-03-10 NOTE — Progress Notes (Signed)
Patient ID: Charles Mccann, male   DOB: March 06, 1950, 66 y.o.   MRN: [id number]  CC: Right groin bulge  HPI Charles Mccann is a 66 y.o. male who presents to clinic today for evaluation of left groin bulge. Patient states he first noticed it a few months ago. It had a gradual onset and that there has been no pain. He noticed it getting out of the shower one morning with a pressure sensation. It is always been easily reducible. He does think is getting larger. He states although he doesn't remember inciting event he has been moving furniture from a vacation properly to a new condo. He denies any fevers, chills, nausea, vomiting, chest pain, diarrhea, constipation. He has chronic intermittent shortness of breath secondary to COPD/asthma. He is otherwise in his usual state of health without any recent travel or changes.  HPI  Past Medical History:  Diagnosis Date  . Asthma    managed by Raul Del  . GERD (gastroesophageal reflux disease)    controlled only with nexium priro prevacid failutre  . Heart murmur   . Hypercholesterolemia   . Hypertension   . Obstructive sleep apnea of adult 2012   on CPAP  tolerating ,  12 cm H20 , room air   . Tetralogy of Fallot   . Treadmill stress test negative for angina pectoris 2005    Past Surgical History:  Procedure Laterality Date  . CATARACT EXTRACTION W/PHACO Right 11/16/2014   Procedure: CATARACT EXTRACTION PHACO AND INTRAOCULAR LENS PLACEMENT ([place]);  Surgeon: Estill Cotta, MD;  Location: [facility] ORS;  Service: Ophthalmology;  Laterality: Right;  [id number] H US:01:03.9 AP%:24.3% CDE:30.12  . TETRALOGY OF FALLOT REPAIR      Family History  Problem Relation Age of Onset  . Heart disease Father 82    CAD  . Cancer Brother 30    liver CA mets to brain   . Cancer Brother     colon Ca , stomach CA    Social History Social History  Substance Use Topics  . Smoking status: Never Smoker  . Smokeless tobacco: Never Used  . Alcohol use Yes      Comment: social    Allergies  Allergen Reactions  . Fish Allergy Swelling    [place]inf[place] - Throat Swells    Current Outpatient Prescriptions  Medication Sig Dispense Refill  . albuterol (PROVENTIL HFA;VENTOLIN HFA) 108 (90 BASE) MCG/ACT inhaler Inhale 2 puffs into the lungs every 6 (six) hours as needed.    Marland Kitchen atorvastatin (LIPITOR) 10 MG tablet TAKE 1 TABLET BY MOUTH  DAILY 90 tablet 0  . budesonide-formoterol (SYMBICORT) 160-4.5 MCG/ACT inhaler Inhale 2 puffs into the lungs 2 (two) times daily.    Marland Kitchen esomeprazole (NEXIUM) 40 MG capsule Take 40 mg by mouth daily before breakfast.    . fluticasone (FLONASE) 50 MCG/ACT nasal spray Place 2 sprays into the nose daily.    . Glucosamine 500 MG CAPS Take 1 capsule by mouth 1 day or 1 dose.    . hydrochlorothiazide (HYDRODIURIL) 25 MG tablet Take 1 tablet (25 mg total) by mouth daily. 30 tablet 0  . levofloxacin (LEVAQUIN) 500 MG tablet Take 1 tablet (500 mg total) by mouth daily. 7 tablet 0  . montelukast (SINGULAIR) 10 MG tablet Take 10 mg by mouth at bedtime.    . potassium chloride (KLOR-CON) 8 MEQ tablet Take 8 mEq by mouth daily.    . Turmeric 450 MG CAPS Take 1 capsule by mouth 1 day  or 1 dose.    Marland Kitchen VIAGRA 100 MG tablet TAKE 1 TABLET DAILY AS NEEDED 90 tablet 1   No current facility-administered medications for this visit.      Review of Systems A Multi-point review of systems was asked and was negative except for the findings documented in the history of present illness  Physical Exam Blood pressure (!) 162/90, pulse 67, temperature 98.7 F (37.1 C), temperature source Oral, height 6' (1.829 m), weight 100.7 kg (222 lb). CONSTITUTIONAL: No acute distress. EYES: Pupils are equal, round, and reactive to light, Sclera are non-icteric. EARS, NOSE, MOUTH AND THROAT: The oropharynx is clear. The oral mucosa is pink and moist. Hearing is intact to voice. LYMPH NODES:  Lymph nodes in the neck are normal. RESPIRATORY:  Lungs are clear.  There is normal respiratory effort, with equal breath sounds bilaterally, and without pathologic use of accessory muscles. CARDIOVASCULAR: Heart is regular without murmurs, gallops, or rubs. GI: The abdomen is large, soft, nontender, and nondistended. There are no palpable masses but there is a visible umbilical and left inguinal hernia that are both soft, nontender, easily reducible. There is no hepatosplenomegaly. There are normal bowel sounds in all quadrants. GU: Rectal deferred.   MUSCULOSKELETAL: Normal muscle strength and tone. No cyanosis or edema.   SKIN: Turgor is good and there are no pathologic skin lesions or ulcers. NEUROLOGIC: Motor and sensation is grossly normal. Cranial nerves are grossly intact. PSYCH:  Oriented to person, place and time. Affect is normal.  Data Reviewed His most recent labs were reviewed which shows it is electrolytes are all within normal limits 1 week ago. There are no images to review for this encounter. I have personally reviewed the patient's imaging, laboratory findings and medical records.    Assessment    Left inguinal hernia and umbilical hernia    Plan    66 year old male with reducible left inguinal and umbilical hernias. Counseled as to the surgical options of laparoscopic versus open hernia repairs. Both repairs were described in detail to include the risks, benefits, alternatives. At this point he's not sure how he wishes to proceed. He is thinking he wants to have surgery in March but is unsure if he wants an open or laparoscopic repairs. He's also debating whether or not he should wait till June when he turns 69 and will then be helpful for Medicare. Discussed that since the areas are soft and reducible that there is no urgency with this. Should any 1 or both of the hernias become stuck out and no longer become reducible that is then time to get them repaired regardless of what scrotum on his life. He voiced understanding. He's going to take  the educational material provided with him today home and discuss it with his wife. He will call and to discuss when he is ready for an operation. He understands that it is greater than 30 days from today he'll have to come back in for an additional office visit.     Time spent with the patient was 45 minutes, with more than 50% of the time spent in face-to-face education, counseling and care coordination.     Clayburn Pert, MD FACS General Surgeon 03/10/2016, 9:00 AM

## 2016-03-13 ENCOUNTER — Telehealth: Payer: Self-pay | Admitting: General Surgery

## 2016-03-13 NOTE — Telephone Encounter (Signed)
Pt advised of pre op date/time and sx date. Sx: 03/21/16 with Dr Golden Circle left inguinal hernia repair, possible umbilical hernia repair.  Pre op: 03/14/16 @ 1:45pm.  Patient made aware to call [phone], between 1-3:00pm the day before surgery, to find out what time to arrive.

## 2016-03-14 ENCOUNTER — Inpatient Hospital Stay: Admission: RE | Admit: 2016-03-14 | Payer: 59 | Source: Ambulatory Visit

## 2016-03-15 ENCOUNTER — Ambulatory Visit
Admission: RE | Admit: 2016-03-15 | Discharge: 2016-03-15 | Disposition: A | Discharge status: Home / Self Care | Payer: 59 | Source: Ambulatory Visit | Attending: General Surgery | Admitting: General Surgery

## 2016-03-15 ENCOUNTER — Encounter
Admission: RE | Admit: 2016-03-15 | Discharge: 2016-03-15 | Disposition: A | Discharge status: Home / Self Care | Payer: 59 | Source: Ambulatory Visit | Attending: General Surgery | Admitting: General Surgery

## 2016-03-15 DIAGNOSIS — Z01810 Encounter for preprocedural cardiovascular examination: Secondary | ICD-10-CM

## 2016-03-15 DIAGNOSIS — Z01818 Encounter for other preprocedural examination: Secondary | ICD-10-CM | POA: Diagnosis present

## 2016-03-15 DIAGNOSIS — Z01812 Encounter for preprocedural laboratory examination: Secondary | ICD-10-CM | POA: Diagnosis present

## 2016-03-15 HISTORY — DX: Unspecified osteoarthritis, unspecified site: M19.90

## 2016-03-15 HISTORY — DX: Chronic obstructive pulmonary disease, unspecified: J44.9

## 2016-03-15 LAB — CBC
HCT: 43.5 % (ref 40.0–52.0)
Hemoglobin: 14.5 g/dL (ref 13.0–18.0)
MCH: 30.5 pg (ref 26.0–34.0)
MCHC: 33.4 g/dL (ref 32.0–36.0)
MCV: 91.1 fL (ref 80.0–100.0)
PLATELETS: 226 10*3/uL (ref 150–440)
RBC: 4.77 MIL/uL (ref 4.40–5.90)
RDW: 13.1 % (ref 11.5–14.5)
WBC: 10.9 10*3/uL — ABNORMAL HIGH (ref 3.8–10.6)

## 2016-03-15 LAB — DIFFERENTIAL
Basophils Absolute: 0.1 10*3/uL (ref 0–0.1)
Basophils Relative: 1 %
EOS PCT: 7 %
Eosinophils Absolute: 0.8 10*3/uL — ABNORMAL HIGH (ref 0–0.7)
LYMPHS ABS: 2.5 10*3/uL (ref 1.0–3.6)
LYMPHS PCT: 23 %
MONO ABS: 0.9 10*3/uL (ref 0.2–1.0)
Monocytes Relative: 8 %
Neutro Abs: 6.6 10*3/uL — ABNORMAL HIGH (ref 1.4–6.5)
Neutrophils Relative %: 61 %

## 2016-03-15 LAB — POTASSIUM: POTASSIUM: 4.1 mmol/L (ref 3.5–5.1)

## 2016-03-15 NOTE — Patient Instructions (Signed)
  Your procedure is scheduled on: March 21, 2016 (Tuesday) Report to Same Day Surgery 2nd floor medical mall [facility] Entrance-take elevator on left to 2nd floor.  Check in with surgery information desk.) To find out your arrival time please call [phone] between 1PM - 3PM on March 20, 2016 (Monday)  Remember: Instructions that are not followed completely may result in serious medical risk, up to and including death, or upon the discretion of your surgeon and anesthesiologist your surgery may need to be rescheduled.    _x___ 1. Do not eat food or drink liquids after midnight. No gum chewing or hard candies.     __x__ 2. No Alcohol for 24 hours before or after surgery.   __x__3. No Smoking for 24 prior to surgery.   ____  4. Bring all medications with you on the day of surgery if instructed.    __x__ 5. Notify your doctor if there is any change in your medical condition     (cold, fever, infections).     Do not wear jewelry, make-up, hairpins, clips or nail polish.  Do not wear lotions, powders, or perfumes. You may wear deodorant.  Do not shave 48 hours prior to surgery. Men may shave face and neck.  Do not bring valuables to the hospital.    [facility] is not responsible for any belongings or valuables.               Contacts, dentures or bridgework may not be worn into surgery.  Leave your suitcase in the car. After surgery it may be brought to your room.  For patients admitted to the hospital, discharge time is determined by your treatment team.   Patients discharged the day of surgery will not be allowed to drive home.  You will need someone to drive you home and stay with you the night of your procedure.    Please read over the following fact sheets that you were given:   [facility] Preparing for Surgery and or MRSA Information   _x___ Take these medicines the morning of surgery with A SIP OF WATER:    1. Nexium (Nexium at bedtime on Monday night, February  12)  2.  3.  4.  5.  6.  ____Fleets enema or Magnesium Citrate as directed.   _x___ Use CHG Soap or sage wipes as directed on instruction sheet   _x__ Use inhalers on the day of surgery and bring to hospital day of surgery (Use Albuterol and Symbicort inhalers the morning of surgery and bring to hospital )  ____ Stop metformin 2 days prior to surgery    ____ Take 1/2 of usual insulin dose the night before surgery and none on the morning of           surgery.   __x__ Stop Aspirin, Coumadin, Pllavix ,Eliquis, Effient, or Pradaxa  x__ Stop Anti-inflammatories such as Advil, Aleve, Ibuprofen, Motrin, Naproxen,          Naprosyn, Goodies powders or aspirin products. Ok to take Tylenol.   _x___ Stop supplements until after surgery.  (Stop Glucosamine and Tumeric now)  _x___ Bring C-Pap to the hospital.

## 2016-03-16 ENCOUNTER — Telehealth: Payer: Self-pay

## 2016-03-16 NOTE — Telephone Encounter (Signed)
Call made to patient at this time to explain that we would need for him to have Cardiology clearance prior to being able to do his hernia surgery. He states that he had Open Heart Surgery at [facility] in 1974 by a Dr. Parks Ranger has since retired. But, was cleared and has not  Been seen by any Cardiologist in over 15 years. He prefers to be seen at [facility] since they have his records.  Call made to [facility] [phone])- [phone] at this time. Since it has been so long since patient has been seen his records would need archived and a new referral will need to be sent by our office to have patient seen. This process will take 6-8 weeks to get archived records per receptionist.  Call was returned to patient and all information above was given. He will call his wife and call back with whom he would like to see for clearance.

## 2016-03-16 NOTE — Telephone Encounter (Signed)
Patient is calling to discuss who he would like to see for his cardiac clearance. Please call patient and advice.

## 2016-03-17 ENCOUNTER — Other Ambulatory Visit (INDEPENDENT_AMBULATORY_CARE_PROVIDER_SITE_OTHER): Payer: 59

## 2016-03-17 ENCOUNTER — Ambulatory Visit (INDEPENDENT_AMBULATORY_CARE_PROVIDER_SITE_OTHER): Payer: 59

## 2016-03-17 VITALS — BP 118/84 | HR 68 | Resp 16

## 2016-03-17 DIAGNOSIS — I10 Essential (primary) hypertension: Secondary | ICD-10-CM

## 2016-03-17 DIAGNOSIS — Z1159 Encounter for screening for other viral diseases: Secondary | ICD-10-CM

## 2016-03-17 LAB — BASIC METABOLIC PANEL
BUN: 20 mg/dL (ref 6–23)
CALCIUM: 9.8 mg/dL (ref 8.4–10.5)
CO2: 31 meq/L (ref 19–32)
CREATININE: 1.1 mg/dL (ref 0.40–1.50)
Chloride: 100 mEq/L (ref 96–112)
GFR: 71.48 mL/min (ref >60.00)
Glucose, Bld: 107 mg/dL — ABNORMAL HIGH (ref 70–99)
Potassium: 3.9 mEq/L (ref 3.5–5.1)
Sodium: 139 mEq/L (ref 135–145)

## 2016-03-17 NOTE — Telephone Encounter (Signed)
Called patient back to ask what he wanted [place] to do. I asked if he needed [place] to refer him to see a cardiologist or not. He stated that his son is a physician and was able to get him an appointment with a cardiologist Charles Mccann, Charles Kay, MD) at [facility]. His appointment will be on 03/20/2016 and he stated that he will give [place] a call once he sees the doctor to let us know if he could give him cardiac clearance.  Once patient calls [place], determine if we should cancel his surgery or not.

## 2016-03-17 NOTE — Telephone Encounter (Signed)
Cardiac Clearance Faxed to Dr. Nils Flack at this time.  Fax [phone] and Phone [phone]

## 2016-03-17 NOTE — Progress Notes (Addendum)
Patient comes in for 1 week blood pressure check .  He has only been taking HCTZ 25 mg for about 1 week due to just received in mail.  Only checked in right arm due to patient had labs prior to checking blood pressure.   He has checked blood pressure a couple of times and reading have been 142/91  and 135/81.   Please advise.    Home readings are a goo 20 pts higher than what you got in the office.  Continue hctz .

## 2016-03-18 LAB — HEPATITIS C ANTIBODY: HCV Ab: NEGATIVE

## 2016-03-19 ENCOUNTER — Encounter: Payer: Self-pay | Admitting: Internal Medicine

## 2016-03-20 MED ORDER — HYDROCHLOROTHIAZIDE 25 MG PO TABS: 25.0000 mg | ORAL_TABLET | Freq: Every day | ORAL | 1 refills | Status: DC

## 2016-03-20 NOTE — Telephone Encounter (Signed)
error 

## 2016-03-20 NOTE — Telephone Encounter (Signed)
Call made to Dr [facility] office at this time. Message left for nurse for return phone call. According to Physician's note from today, patient is in need of additional cardiac testing. Call made to OR and case cancelled at this time. Patient was made aware.  Will reschedule pending cardiac clearance.

## 2016-03-21 ENCOUNTER — Encounter: Admission: RE | Payer: Self-pay | Source: Ambulatory Visit

## 2016-03-21 ENCOUNTER — Ambulatory Visit: Admission: RE | Admit: 2016-03-21 | Payer: 59 | Source: Ambulatory Visit | Admitting: General Surgery

## 2016-03-21 SURGERY — REPAIR, HERNIA, INGUINAL, ADULT
Anesthesia: Choice

## 2016-03-23 ENCOUNTER — Encounter: Payer: Self-pay | Admitting: Internal Medicine

## 2016-03-23 MED ORDER — OSELTAMIVIR PHOSPHATE 75 MG PO CAPS: 75.0000 mg | ORAL_CAPSULE | Freq: Every day | ORAL | 0 refills | Status: DC

## 2016-03-23 NOTE — Telephone Encounter (Signed)
Please phone in the tamifllu to the out of town pharmacy he requested in his e mail

## 2016-03-31 ENCOUNTER — Telehealth: Payer: Self-pay

## 2016-03-31 NOTE — Telephone Encounter (Signed)
Cardiac Clearance obtained at this time from DR.Weickert and will be scanned under media.

## 2016-03-31 NOTE — Telephone Encounter (Signed)
Cardiac

## 2016-04-04 ENCOUNTER — Other Ambulatory Visit: Payer: Self-pay

## 2016-04-04 ENCOUNTER — Telehealth: Payer: Self-pay

## 2016-04-04 NOTE — Telephone Encounter (Signed)
Spoke with Dr. Adonis Huguenin regarding this patient. He would like to have patient follow-up in office on Friday 3/2 to update H&P prior to rescheduling surgery. Patient is in agreement and has been placed on schedule.

## 2016-04-04 NOTE — Telephone Encounter (Signed)
Cardiac Clearance obtained from Dr.Weickert at this time and will be scanned under media.

## 2016-04-07 ENCOUNTER — Encounter: Payer: Self-pay | Admitting: General Surgery

## 2016-04-07 ENCOUNTER — Ambulatory Visit (INDEPENDENT_AMBULATORY_CARE_PROVIDER_SITE_OTHER): Payer: 59 | Admitting: General Surgery

## 2016-04-07 VITALS — BP 156/74 | HR 70 | Temp 98.4°F | Ht 72.0 in | Wt 221.2 lb

## 2016-04-07 DIAGNOSIS — K4090 Unilateral inguinal hernia, without obstruction or gangrene, not specified as recurrent: Secondary | ICD-10-CM | POA: Diagnosis not present

## 2016-04-07 DIAGNOSIS — K429 Umbilical hernia without obstruction or gangrene: Secondary | ICD-10-CM

## 2016-04-07 DIAGNOSIS — Z9889 Other specified postprocedural states: Secondary | ICD-10-CM | POA: Insufficient documentation

## 2016-04-07 HISTORY — DX: Unilateral inguinal hernia, without obstruction or gangrene, not specified as recurrent: K40.90

## 2016-04-07 NOTE — Patient Instructions (Signed)
You have chose to have your Inguinal and Umbilical hernias repaired. This will be done by Dr. Adonis Huguenin on 04/20/16 at [facility].  Please see your (blue) Pre-care information that you have been given today.  You will need to arrange to be out of work for 2 weeks and then return with a lifting restrictions for 4 more weeks. Please send any FMLA paperwork prior to surgery and we will fill this out and fax it back to your employer within 3 business days.  You may have a bruise in your groin and also swelling and brusing in your testicle area. You may use ice 4-5 times daily for 15-20 minutes each time. Make sure that you place a barrier between you and the ice pack. To decrease swelling, you may roll up a towel and place this vertically between your legs to elevate this area.  Inguinal Hernia, Adult Muscles help keep everything in the body in its proper place. But if a weak spot in the muscles develops, something can poke through. That is called a hernia. When this happens in the lower part of the belly (abdomen), it is called an inguinal hernia. (It takes its name from a part of the body in this region called the inguinal canal.) A weak spot in the wall of muscles lets some fat or part of the small intestine bulge through. An inguinal hernia can develop at any age. Men get them more often than women. CAUSES  In adults, an inguinal hernia develops over time.  It can be triggered by:  Suddenly straining the muscles of the lower abdomen.  Lifting heavy objects.  Straining to have a bowel movement. Difficult bowel movements (constipation) can lead to this.  Constant coughing. This may be caused by smoking or lung disease.  Being overweight.  Being pregnant.  Working at a job that requires long periods of standing or heavy lifting.  Having had an inguinal hernia before. One type can be an emergency situation. It is called a strangulated inguinal hernia. It develops if part of the small intestine slips  through the weak spot and cannot get back into the abdomen. The blood supply can be cut off. If that happens, part of the intestine may die. This situation requires emergency surgery. SYMPTOMS  Often, a small inguinal hernia has no symptoms. It is found when a healthcare provider does a physical exam. Larger hernias usually have symptoms.   In adults, symptoms may include:  A lump in the groin. This is easier to see when the person is standing. It might disappear when lying down.  In men, a lump in the scrotum.  Pain or burning in the groin. This occurs especially when lifting, straining or coughing.  A dull ache or feeling of pressure in the groin.  Signs of a strangulated hernia can include:  A bulge in the groin that becomes very painful and tender to the touch.  A bulge that turns red or purple.  Fever, nausea and vomiting.  Inability to have a bowel movement or to pass gas. DIAGNOSIS  To decide if you have an inguinal hernia, a healthcare provider will probably do a physical examination.  This will include asking questions about any symptoms you have noticed.  The healthcare provider might feel the groin area and ask you to cough. If an inguinal hernia is felt, the healthcare provider may try to slide it back into the abdomen.  Usually no other tests are needed. TREATMENT  Treatments can vary. The  size of the hernia makes a difference. Options include:  Watchful waiting. This is often suggested if the hernia is small and you have had no symptoms.  No medical procedure will be done unless symptoms develop.  You will need to watch closely for symptoms. If any occur, contact your healthcare provider right away.  Surgery. This is used if the hernia is larger or you have symptoms.  Open surgery. This is usually an outpatient procedure (you will not stay overnight in a hospital). An cut (incision) is made through the skin in the groin. The hernia is put back inside the  abdomen. The weak area in the muscles is then repaired by herniorrhaphy or hernioplasty. Herniorrhaphy: in this type of surgery, the weak muscles are sewn back together. Hernioplasty: a patch or mesh is used to close the weak area in the abdominal wall.  Laparoscopy. In this procedure, a surgeon makes small incisions. A thin tube with a tiny video camera (called a laparoscope) is put into the abdomen. The surgeon repairs the hernia with mesh by looking with the video camera and using two long instruments. HOME CARE INSTRUCTIONS   After surgery to repair an inguinal hernia:  You will need to take pain medicine prescribed by your healthcare provider. Follow all directions carefully.  You will need to take care of the wound from the incision.  Your activity will be restricted for awhile. This will probably include no heavy lifting for several weeks. You also should not do anything too active for a few weeks. When you can return to work will depend on the type of job that you have.  During "watchful waiting" periods, you should:  Maintain a healthy weight.  Eat a diet high in fiber (fruits, vegetables and whole grains).  Drink plenty of fluids to avoid constipation. This means drinking enough water and other liquids to keep your urine clear or pale yellow.  Do not lift heavy objects.  Do not stand for long periods of time.  Quit smoking. This should keep you from developing a frequent cough. SEEK MEDICAL CARE IF:   A bulge develops in your groin area.  You feel pain, a burning sensation or pressure in the groin. This might be worse if you are lifting or straining.  You develop a fever of more than 100.5 F (38.1 C). SEEK IMMEDIATE MEDICAL CARE IF:   Pain in the groin increases suddenly.  A bulge in the groin gets bigger suddenly and does not go down.  For men, there is sudden pain in the scrotum. Or, the size of the scrotum increases.  A bulge in the groin area becomes red or  purple and is painful to touch.  You have nausea or vomiting that does not go away.  You feel your heart beating much faster than normal.  You cannot have a bowel movement or pass gas.  You develop a fever of more than 102.0 F (38.9 C).   This information is not intended to replace advice given to you by your health care provider. Make sure you discuss any questions you have with your health care provider.   Document Released: 06/11/2008 Document Revised: 04/17/2011 Document Reviewed: 07/27/2014 Elsevier Interactive Patient Education 2016 Else[employer] Inc[employer]    Umbilical Hernia, Adult A hernia is a bulge of tissue that pushes through an opening between muscles. An umbilical hernia happens in the abdomen, near the belly button (umbilicus). The hernia may contain tissues from the small intestine, large intestine, or fatty  tissue covering the intestines (omentum). Umbilical hernias in adults tend to get worse over time, and they require surgical treatment. There are several types of umbilical hernias. You may have: A hernia located just above or below the umbilicus (indirect hernia). This is the most common type of umbilical hernia in adults. A hernia that forms through an opening formed by the umbilicus (direct hernia). A hernia that comes and goes (reducible hernia). A reducible hernia may be visible only when you strain, lift something heavy, or cough. This type of hernia can be pushed back into the abdomen (reduced). A hernia that traps abdominal tissue inside the hernia (incarcerated hernia). This type of hernia cannot be reduced. A hernia that cuts off blood flow to the tissues inside the hernia (strangulated hernia). The tissues can start to die if this happens. This type of hernia requires emergency treatment. What are the causes? An umbilical hernia happens when tissue inside the abdomen presses on a weak area of the abdominal muscles. What increases the risk? You may have a greater  risk of this condition if you: Are obese. Have had several pregnancies. Have a buildup of fluid inside your abdomen (ascites). Have had surgery that weakens the abdominal muscles. What are the signs or symptoms? The main symptom of this condition is a painless bulge at or near the belly button. A reducible hernia may be visible only when you strain, lift something heavy, or cough. Other symptoms may include: Dull pain. A feeling of pressure. Symptoms of a strangulated hernia may include: Pain that gets increasingly worse. Nausea and vomiting. Pain when pressing on the hernia. Skin over the hernia becoming red or purple. Constipation. Blood in the stool. How is this diagnosed? This condition may be diagnosed based on: A physical exam. You may be asked to cough or strain while standing. These actions increase the pressure inside your abdomen and force the hernia through the opening in your muscles. Your health care provider may try to reduce the hernia by pressing on it. Your symptoms and medical history. How is this treated? Surgery is the only treatment for an umbilical hernia. Surgery for a strangulated hernia is done as soon as possible. If you have a small hernia that is not incarcerated, you may need to lose weight before having surgery. Follow these instructions at home: Lose weight, if told by your health care provider. Do not try to push the hernia back in. Watch your hernia for any changes in color or size. Tell your health care provider if any changes occur. You may need to avoid activities that increase pressure on your hernia. Do not lift anything that is heavier than 10 lb (4.5 kg) until your health care provider says that this is safe. Take over-the-counter and prescription medicines only as told by your health care provider. Keep all follow-up visits as told by your health care provider. This is important. Contact a health care provider if: Your hernia gets larger. Your  hernia becomes painful. Get help right away if: You develop sudden, severe pain near the area of your hernia. You have pain as well as nausea or vomiting. You have pain and the skin over your hernia changes color. You develop a fever. This information is not intended to replace advice given to you by your health care provider. Make sure you discuss any questions you have with your health care provider. Document Released: 06/25/2015 Document Revised: 09/26/2015 Document Reviewed: 06/25/2015 Elsevier Interactive Patient Education  2017 [employer].

## 2016-04-07 NOTE — Progress Notes (Signed)
Outpatient Surgical Follow Up  04/07/2016  Charles Mccann is an 66 y.o. male.   Chief Complaint  Patient presents with  . Follow-up    Update H&P for Left Inguinal Hernia Repair    HPI: Charles Mccann is a 66 y.o. male who returns to clinic today for evaluation of left groin and umbilical bulges. They have continued to be easily reducible. He does think they are getting larger. He states although he doesn't remember inciting event he has been moving furniture from a vacation properly to a new condo. He denies any fevers, chills, nausea, vomiting, chest pain, diarrhea, constipation. He has chronic intermittent shortness of breath secondary to COPD/asthma. He is otherwise in his usual state of health without any recent travel or changes. Since his last visit he was initially scheduled for surgery but required cardiac clearance. Cardiac clearance has been acquired and he desired to have his hernias repaired as soon as possible.  Past Medical History:  Diagnosis Date  . Arthritis   . Asthma    managed by [facility]  . COPD (chronic obstructive pulmonary disease) (HCC)   . GERD (gastroesophageal reflux disease)    controlled only with nexium priro prevacid failutre  . Heart murmur   . Hypercholesterolemia   . Hypertension   . Obstructive sleep apnea of adult 2012   on CPAP  tolerating ,  12 cm H20 , room air   . Tetralogy of Fallot   . Treadmill stress test negative for angina pectoris 2005    Past Surgical History:  Procedure Laterality Date  . CARDIAC CATHETERIZATION    . CATARACT EXTRACTION W/PHACO Right 11/16/2014   Procedure: CATARACT EXTRACTION PHACO AND INTRAOCULAR LENS PLACEMENT (IOC);  Surgeon: Steven Dingeldein, MD;  Location: [facility] ORS;  Service: Ophthalmology;  Laterality: Right;  Lot#[id number] US:01:03.9 AP%:24.3% CDE:30.12  . EYE SURGERY    . TETRALOGY OF FALLOT REPAIR  1974   [facility] [place]  . TONSILLECTOMY      Family History  Problem Relation Age of Onset   . Heart disease Father 64    CAD  . Cancer Brother 44    liver CA mets to brain   . Cancer Brother     colon Ca , stomach CA    Social History:  reports that he has never smoked. He has never used smokeless tobacco. He reports that he drinks alcohol. He reports that he does not use drugs.  Allergies:  Allergies  Allergen Reactions  . Fish Allergy Swelling    Finfish - Throat Swells    Medications reviewed.    ROS A multipoint review of systems was completed, all pertinent positives and negatives are documented within the history of present illness and remainder are negative.   BP (!) 156/74   Pulse 70   Temp 98.4 F (36.9 C) (Oral)   Ht 6' (1.829 m)   Wt 100.3 kg (221 lb 3.2 oz)   BMI 30.00 kg/m   Physical Exam CONSTITUTIONAL: No acute distress. EYES: Pupils are equal, round, and reactive to light, Sclera are non-icteric. EARS, NOSE, MOUTH AND THROAT: The oropharynx is clear. The oral mucosa is pink and moist. Hearing is intact to voice. LYMPH NODES:  Lymph nodes in the neck are normal. RESPIRATORY:  Lungs are clear. There is normal respiratory effort, with equal breath sounds bilaterally, and without pathologic use of accessory muscles. CARDIOVASCULAR: Heart is regular without murmurs, gallops, or rubs. GI: The abdomen is large, soft, nontender, and   nondistended. There are no palpable masses but there is a visible umbilical and left inguinal hernia that are both soft, nontender, easily reducible. There is no hepatosplenomegaly. There are normal bowel sounds in all quadrants. GU: Rectal deferred.   MUSCULOSKELETAL: Normal muscle strength and tone. No cyanosis or edema.   SKIN: Turgor is good and there are no pathologic skin lesions or ulcers. NEUROLOGIC: Motor and sensation is grossly normal. Cranial nerves are grossly intact. PSYCH:  Oriented to person, place and time. Affect is normal.    No results found for this or any previous visit (from the past 48  hour(s)). No results found.  Assessment/Plan:  1. Left inguinal hernia Again discussed that the large size of his left inguinal hernia would be best repaired an open approach. The procedure was described in detail again to include the risks, benefits, alternatives. He voiced understanding and desires to proceed as soon as possible.  2. Umbilical hernia without obstruction and without gangrene Patient also has a symptomatic umbilical hernia. He states although the left groin bulge is larger it is the umbilical hernia that causes some more pain. He desires to have them both repaired at the same time if possible. Discussed again an open approach in detail to include the risks, benefits, alternatives. He wishes understanding and desires to proceed. We will tentatively plan to proceed with both repairs on Thursday, March 15.  A total 25 minutes was used on this encounter is greater than 50% of a user counseling her coordination of care.     Miamarie Moll, MD FACS General Surgeon  04/07/2016,6:14 PM  

## 2016-04-10 ENCOUNTER — Telehealth: Payer: Self-pay

## 2016-04-10 NOTE — Telephone Encounter (Signed)
Authorization for CPT codes [phone], [phone].   The notification/prior authorization case information was transmitted on 04/10/2016 at 1:55 PM CST. The notification/prior authorization reference number is [id number]. Please print this page for your records.  Your Notification/Prior Authorization submission has been Approved and no further action is required for this request

## 2016-04-10 NOTE — Telephone Encounter (Signed)
Patient has been advised of Surgery Date as well as Pre-Admission appointment date, time, and location.  Surgery Date: 04/20/16  Pre-admit Appointment: 04/13/16 from 1p-5p (Phone)  Patient has been advised to call [phone] the day before surgery between 1-3pm to obtain arrival time.

## 2016-04-13 ENCOUNTER — Encounter
Admission: RE | Admit: 2016-04-13 | Discharge: 2016-04-13 | Disposition: A | Discharge status: Home / Self Care | Payer: 59 | Source: Ambulatory Visit | Attending: General Surgery | Admitting: General Surgery

## 2016-04-13 HISTORY — DX: Abdominal aortic aneurysm, without rupture: I71.4

## 2016-04-13 NOTE — Patient Instructions (Signed)
  Your procedure is scheduled on: 04-20-16 Report to Same Day Surgery 2nd floor medical mall [facility] Entrance-take elevator on left to 2nd floor.  Check in with surgery information desk.) To find out your arrival time please call [phone] between 1PM - 3PM on 04-19-16  Remember: Instructions that are not followed completely may result in serious medical risk, up to and including death, or upon the discretion of your surgeon and anesthesiologist your surgery may need to be rescheduled.    _x___ 1. Do not eat food or drink liquids after midnight. No gum chewing or                              hard candies.     __x__ 2. No Alcohol for 24 hours before or after surgery.   __x__3. No Smoking for 24 prior to surgery.   ____  4. Bring all medications with you on the day of surgery if instructed.    __x__ 5. Notify your doctor if there is any change in your medical condition     (cold, fever, infections).     Do not wear jewelry, make-up, hairpins, clips or nail polish.  Do not wear lotions, powders, or perfumes. You may wear deodorant.  Do not shave 48 hours prior to surgery. Men may shave face and neck.  Do not bring valuables to the hospital.    [facility] is not responsible for any belongings or valuables.               Contacts, dentures or bridgework may not be worn into surgery.  Leave your suitcase in the car. After surgery it may be brought to your room.  For patients admitted to the hospital, discharge time is determined by your                       treatment team.   Patients discharged the day of surgery will not be allowed to drive home.  You will need someone to drive you home and stay with you the night of your procedure.    Please read over the following fact sheets that you were given:   [facility] Preparing for Surgery and or MRSA Information   _x___ Take anti-hypertensive (unless it includes a diuretic), cardiac, seizure, asthma,     anti-reflux and psychiatric  medicines. These include:  1. Bee  2. TAKE A NEXIUM ON Wednesday NIGHT BEFORE BED  3.  4.  5.  6.  ____Fleets enema or Magnesium Citrate as directed.   _x___ Use CHG Soap or sage wipes as directed on instruction sheet   _X___ Use inhalers on the day of surgery and bring to hospital day of surgery-USE SYMBICORT AND ALBUTEROL INHALER AT HOME AND BRING ALBUTEROL INHALER  ____ Stop Metformin and Janumet 2 days prior to surgery.    ____ Take 1/2 of usual insulin dose the night before surgery and none on the morning     surgery.   ____ Follow recommendations from Cardiologist, Pulmonologist or PCP regarding stopping Aspirin, Coumadin, Pllavix ,Eliquis, Effient, or Pradaxa, and Pletal.  X____Stop Anti-inflammatories such as Advil, Aleve, Ibuprofen, Motrin, Naproxen, Naprosyn, Goodies powders or aspirin products NOW-OK to take Tylenol   _x___ Stop supplements until after surgery-STOP TURMERIC AND GLUCOSAMINE NOW   ____ Bring C-Pap to the hospital.

## 2016-04-19 NOTE — Pre-Procedure Instructions (Signed)
Medical and Cardiac Clearance on chart

## 2016-04-20 ENCOUNTER — Encounter: Payer: Self-pay | Admitting: *Deleted

## 2016-04-20 ENCOUNTER — Ambulatory Visit: Payer: 59 | Admitting: Anesthesiology

## 2016-04-20 ENCOUNTER — Encounter
Admission: RE | Disposition: A | Discharge status: Home / Self Care | Payer: Self-pay | Source: Ambulatory Visit | Attending: General Surgery

## 2016-04-20 ENCOUNTER — Ambulatory Visit
Admission: RE | Admit: 2016-04-20 | Discharge: 2016-04-20 | Disposition: A | Discharge status: Home / Self Care | Payer: 59 | Source: Ambulatory Visit | Attending: General Surgery | Admitting: General Surgery

## 2016-04-20 DIAGNOSIS — E7800 Pure hypercholesterolemia, unspecified: Secondary | ICD-10-CM | POA: Diagnosis not present

## 2016-04-20 DIAGNOSIS — Z9841 Cataract extraction status, right eye: Secondary | ICD-10-CM | POA: Insufficient documentation

## 2016-04-20 DIAGNOSIS — Z9842 Cataract extraction status, left eye: Secondary | ICD-10-CM | POA: Diagnosis not present

## 2016-04-20 DIAGNOSIS — M1991 Primary osteoarthritis, unspecified site: Secondary | ICD-10-CM | POA: Diagnosis not present

## 2016-04-20 DIAGNOSIS — Z8249 Family history of ischemic heart disease and other diseases of the circulatory system: Secondary | ICD-10-CM | POA: Insufficient documentation

## 2016-04-20 DIAGNOSIS — J449 Chronic obstructive pulmonary disease, unspecified: Secondary | ICD-10-CM | POA: Insufficient documentation

## 2016-04-20 DIAGNOSIS — Z800 Family history of malignant neoplasm of digestive organs: Secondary | ICD-10-CM | POA: Diagnosis not present

## 2016-04-20 DIAGNOSIS — Z9889 Other specified postprocedural states: Secondary | ICD-10-CM | POA: Insufficient documentation

## 2016-04-20 DIAGNOSIS — I10 Essential (primary) hypertension: Secondary | ICD-10-CM | POA: Diagnosis not present

## 2016-04-20 DIAGNOSIS — Z961 Presence of intraocular lens: Secondary | ICD-10-CM | POA: Insufficient documentation

## 2016-04-20 DIAGNOSIS — K219 Gastro-esophageal reflux disease without esophagitis: Secondary | ICD-10-CM | POA: Insufficient documentation

## 2016-04-20 DIAGNOSIS — D176 Benign lipomatous neoplasm of spermatic cord: Secondary | ICD-10-CM | POA: Diagnosis not present

## 2016-04-20 DIAGNOSIS — K4090 Unilateral inguinal hernia, without obstruction or gangrene, not specified as recurrent: Secondary | ICD-10-CM | POA: Insufficient documentation

## 2016-04-20 DIAGNOSIS — G4733 Obstructive sleep apnea (adult) (pediatric): Secondary | ICD-10-CM | POA: Insufficient documentation

## 2016-04-20 DIAGNOSIS — Z91013 Allergy to seafood: Secondary | ICD-10-CM | POA: Diagnosis not present

## 2016-04-20 DIAGNOSIS — K429 Umbilical hernia without obstruction or gangrene: Secondary | ICD-10-CM

## 2016-04-20 HISTORY — PX: UMBILICAL HERNIA REPAIR: SHX196

## 2016-04-20 HISTORY — PX: INGUINAL HERNIA REPAIR: SHX194

## 2016-04-20 SURGERY — REPAIR, HERNIA, UMBILICAL, ADULT
Anesthesia: General | Wound class: Clean

## 2016-04-20 MED ORDER — FENTANYL CITRATE (PF) 250 MCG/5ML IJ SOLN
INTRAMUSCULAR | Status: AC
  Filled 2016-04-20: qty 5

## 2016-04-20 MED ORDER — CHLORHEXIDINE GLUCONATE CLOTH 2 % EX PADS: 6.0000 | MEDICATED_PAD | Freq: Once | CUTANEOUS | Status: DC

## 2016-04-20 MED ORDER — BUPIVACAINE LIPOSOME 1.3 % IJ SUSP
INTRAMUSCULAR | Status: AC
  Filled 2016-04-20: qty 20

## 2016-04-20 MED ORDER — MIDAZOLAM HCL 5 MG/5ML IJ SOLN
INTRAMUSCULAR | Status: DC | PRN
  Administered 2016-04-20: 2 mg via INTRAVENOUS

## 2016-04-20 MED ORDER — OXYCODONE HCL 5 MG PO TABS
5.0000 mg | ORAL_TABLET | Freq: Once | ORAL | Status: AC | PRN
  Administered 2016-04-20: 5 mg via ORAL

## 2016-04-20 MED ORDER — SUGAMMADEX SODIUM 200 MG/2ML IV SOLN
INTRAVENOUS | Status: AC
  Filled 2016-04-20: qty 2

## 2016-04-20 MED ORDER — DEXAMETHASONE SODIUM PHOSPHATE 10 MG/ML IJ SOLN
INTRAMUSCULAR | Status: AC
  Filled 2016-04-20: qty 1

## 2016-04-20 MED ORDER — SODIUM CHLORIDE 0.9 % IV SOLN
INTRAVENOUS | Status: DC | PRN
  Administered 2016-04-20: 07:00:00 via INTRAVENOUS

## 2016-04-20 MED ORDER — LACTATED RINGERS IV SOLN
INTRAVENOUS | Status: DC
  Administered 2016-04-20: 07:00:00 via INTRAVENOUS

## 2016-04-20 MED ORDER — MIDAZOLAM HCL 2 MG/2ML IJ SOLN
INTRAMUSCULAR | Status: AC
  Filled 2016-04-20: qty 2

## 2016-04-20 MED ORDER — LIDOCAINE HCL (PF) 1 % IJ SOLN
INTRAMUSCULAR | Status: DC | PRN
  Administered 2016-04-20: 20 mL

## 2016-04-20 MED ORDER — ACETAMINOPHEN 10 MG/ML IV SOLN
INTRAVENOUS | Status: AC
  Filled 2016-04-20: qty 100

## 2016-04-20 MED ORDER — GLYCOPYRROLATE 0.2 MG/ML IJ SOLN
INTRAMUSCULAR | Status: DC | PRN
  Administered 2016-04-20: 0.2 mg via INTRAVENOUS

## 2016-04-20 MED ORDER — CEFAZOLIN SODIUM-DEXTROSE 2-4 GM/100ML-% IV SOLN
INTRAVENOUS | Status: AC
  Filled 2016-04-20: qty 100

## 2016-04-20 MED ORDER — ONDANSETRON HCL 4 MG/2ML IJ SOLN
INTRAMUSCULAR | Status: AC
  Filled 2016-04-20: qty 2

## 2016-04-20 MED ORDER — DOCUSATE SODIUM 100 MG PO CAPS: 100.0000 mg | ORAL_CAPSULE | Freq: Two times a day (BID) | ORAL | 0 refills | Status: DC

## 2016-04-20 MED ORDER — ONDANSETRON HCL 4 MG/2ML IJ SOLN
INTRAMUSCULAR | Status: DC | PRN
  Administered 2016-04-20: 4 mg via INTRAVENOUS

## 2016-04-20 MED ORDER — DEXAMETHASONE SODIUM PHOSPHATE 10 MG/ML IJ SOLN
INTRAMUSCULAR | Status: DC | PRN
  Administered 2016-04-20: 10 mg via INTRAVENOUS

## 2016-04-20 MED ORDER — SEVOFLURANE IN SOLN
RESPIRATORY_TRACT | Status: AC
  Filled 2016-04-20: qty 250

## 2016-04-20 MED ORDER — PROPOFOL 10 MG/ML IV BOLUS
INTRAVENOUS | Status: DC | PRN
  Administered 2016-04-20: 200 mg via INTRAVENOUS

## 2016-04-20 MED ORDER — FENTANYL CITRATE (PF) 100 MCG/2ML IJ SOLN: 25.0000 ug | INTRAMUSCULAR | Status: DC | PRN

## 2016-04-20 MED ORDER — LIDOCAINE 2% (20 MG/ML) 5 ML SYRINGE
INTRAMUSCULAR | Status: DC | PRN
  Administered 2016-04-20: 100 mg via INTRAVENOUS

## 2016-04-20 MED ORDER — LIDOCAINE HCL (PF) 1 % IJ SOLN
INTRAMUSCULAR | Status: AC
  Filled 2016-04-20: qty 30

## 2016-04-20 MED ORDER — BUPIVACAINE LIPOSOME 1.3 % IJ SUSP
INTRAMUSCULAR | Status: DC | PRN
  Administered 2016-04-20: 10 mL
  Administered 2016-04-20: 30 mL

## 2016-04-20 MED ORDER — ONDANSETRON 4 MG PO TBDP: 4.0000 mg | ORAL_TABLET | Freq: Three times a day (TID) | ORAL | 0 refills | Status: DC | PRN

## 2016-04-20 MED ORDER — OXYCODONE HCL 5 MG PO TABS
ORAL_TABLET | ORAL | Status: AC
  Filled 2016-04-20: qty 1

## 2016-04-20 MED ORDER — HYDROCODONE-ACETAMINOPHEN 5-325 MG PO TABS: 1.0000 | ORAL_TABLET | Freq: Four times a day (QID) | ORAL | 0 refills | Status: DC | PRN

## 2016-04-20 MED ORDER — CEFAZOLIN SODIUM-DEXTROSE 2-4 GM/100ML-% IV SOLN
2.0000 g | INTRAVENOUS | Status: AC
  Administered 2016-04-20: 2 g via INTRAVENOUS

## 2016-04-20 MED ORDER — PROPOFOL 10 MG/ML IV BOLUS
INTRAVENOUS | Status: AC
  Filled 2016-04-20: qty 20

## 2016-04-20 MED ORDER — DEXMEDETOMIDINE HCL 200 MCG/2ML IV SOLN
INTRAVENOUS | Status: DC | PRN
Start: 2016-04-20 — End: 2016-04-20
  Administered 2016-04-20 (×2): 8 ug via INTRAVENOUS

## 2016-04-20 MED ORDER — FENTANYL CITRATE (PF) 100 MCG/2ML IJ SOLN
INTRAMUSCULAR | Status: DC | PRN
  Administered 2016-04-20 (×4): 25 ug via INTRAVENOUS
  Administered 2016-04-20 (×3): 50 ug via INTRAVENOUS

## 2016-04-20 MED ORDER — OXYCODONE HCL 5 MG/5ML PO SOLN: 5.0000 mg | Freq: Once | ORAL | Status: AC | PRN

## 2016-04-20 MED ORDER — ARTIFICIAL TEARS OP OINT
TOPICAL_OINTMENT | OPHTHALMIC | Status: AC
  Filled 2016-04-20: qty 3.5

## 2016-04-20 MED ORDER — SODIUM CHLORIDE 0.9 % IJ SOLN
INTRAMUSCULAR | Status: AC
  Filled 2016-04-20: qty 50

## 2016-04-20 SURGICAL SUPPLY — 47 items
BLADE CLIPPER SURG (BLADE) ×4 IMPLANT
BLADE SURG 15 STRL LF DISP TIS (BLADE) ×2 IMPLANT
BLADE SURG 15 STRL SS (BLADE) ×2
CANISTER SUCT 1200ML W/VALVE (MISCELLANEOUS) ×4 IMPLANT
CHLORAPREP W/TINT 26ML (MISCELLANEOUS) ×4 IMPLANT
COTTON BALL STRL MEDIUM (GAUZE/BANDAGES/DRESSINGS) ×4 IMPLANT
DERMABOND ADVANCED (GAUZE/BANDAGES/DRESSINGS) ×2
DERMABOND ADVANCED .7 DNX12 (GAUZE/BANDAGES/DRESSINGS) ×2 IMPLANT
DRAIN PENROSE 1/4X12 LTX (DRAIN) ×4 IMPLANT
DRAIN PENROSE 5/8X18 LTX STRL (WOUND CARE) ×4 IMPLANT
DRAPE LAPAROTOMY 100X77 ABD (DRAPES) ×4 IMPLANT
DRAPE SHEET LG 3/4 BI-LAMINATE (DRAPES) ×4 IMPLANT
DRSG TEGADERM 4X4.75 (GAUZE/BANDAGES/DRESSINGS) ×4 IMPLANT
DRSG TELFA 4X3 1S NADH ST (GAUZE/BANDAGES/DRESSINGS) IMPLANT
ELECT CAUTERY BLADE 6.4 (BLADE) ×4 IMPLANT
ELECT REM PT RETURN 9FT ADLT (ELECTROSURGICAL) ×4
ELECTRODE REM PT RTRN 9FT ADLT (ELECTROSURGICAL) ×2 IMPLANT
GLOVE BIO SURGEON STRL SZ 6.5 (GLOVE) ×9 IMPLANT
GLOVE BIO SURGEON STRL SZ7 (GLOVE) ×12 IMPLANT
GLOVE BIO SURGEON STRL SZ7.5 (GLOVE) ×8 IMPLANT
GLOVE BIO SURGEONS STRL SZ 6.5 (GLOVE) ×3
GLOVE INDICATOR 7.0 STRL GRN (GLOVE) ×4 IMPLANT
GLOVE INDICATOR 8.0 STRL GRN (GLOVE) ×4 IMPLANT
GOWN STRL REUS W/ TWL LRG LVL3 (GOWN DISPOSABLE) ×12 IMPLANT
GOWN STRL REUS W/TWL LRG LVL3 (GOWN DISPOSABLE) ×12
KIT RM TURNOVER STRD PROC AR (KITS) ×4 IMPLANT
LABEL OR SOLS (LABEL) ×4 IMPLANT
MESH PARIETEX PROGRIP LEFT (Mesh General) ×4 IMPLANT
MESH VENTRALEX ST 2.5 CRC MED (Mesh General) ×4 IMPLANT
NDL SAFETY 25GX1.5 (NEEDLE) ×8 IMPLANT
NEEDLE HYPO 22GX1.5 SAFETY (NEEDLE) ×4 IMPLANT
NEEDLE HYPO 25X1 1.5 SAFETY (NEEDLE) ×8 IMPLANT
NS IRRIG 500ML POUR BTL (IV SOLUTION) ×4 IMPLANT
PACK BASIN MINOR ARMC (MISCELLANEOUS) ×4 IMPLANT
SPONGE XRAY 4X4 16PLY STRL (MISCELLANEOUS) ×4 IMPLANT
SUT ETHIBOND 0 MO6 C/R (SUTURE) ×8 IMPLANT
SUT MNCRL 4-0 (SUTURE) ×2
SUT MNCRL 4-0 27XMFL (SUTURE) ×2
SUT SILK 2 0 SH (SUTURE) ×4 IMPLANT
SUT VIC AB 2-0 CT1 27 (SUTURE) ×4
SUT VIC AB 2-0 CT1 TAPERPNT 27 (SUTURE) ×4 IMPLANT
SUT VIC AB 2-0 CT2 27 (SUTURE) ×8 IMPLANT
SUT VIC AB 3-0 SH 27 (SUTURE) ×4
SUT VIC AB 3-0 SH 27X BRD (SUTURE) ×4 IMPLANT
SUTURE MNCRL 4-0 27XMF (SUTURE) ×2 IMPLANT
SYR 20CC LL (SYRINGE) ×8 IMPLANT
SYRINGE 10CC LL (SYRINGE) IMPLANT

## 2016-04-20 NOTE — Interval H&P Note (Signed)
History and Physical Interval Note:  04/20/2016 7:04 AM  Charles Mccann  has presented today for surgery, with the diagnosis of UMBILICAL HERNIA,INGUINAL HERNIA  The various methods of treatment have been discussed with the patient and family. After consideration of risks, benefits and other options for treatment, the patient has consented to  Procedure(s): HERNIA REPAIR UMBILICAL ADULT (N/A) HERNIA REPAIR INGUINAL ADULT (Left) as a surgical intervention .  The patient's history has been reviewed, patient examined, no change in status, stable for surgery.  I have reviewed the patient's chart and labs.  Questions were answered to the patient's satisfaction.     Clayburn Pert

## 2016-04-20 NOTE — Anesthesia Preprocedure Evaluation (Signed)
Anesthesia Evaluation  Patient identified by MRN, date of birth, ID band Patient awake    Reviewed: Allergy & Precautions, H&P , NPO status , Patient's Chart, lab work & pertinent test results  History of Anesthesia Complications Negative for: history of anesthetic complications  Airway Mallampati: II  TM Distance: >3 FB Neck ROM: full    Dental  (+) Poor Dentition, Chipped, Caps   Pulmonary neg shortness of breath, asthma , sleep apnea , COPD,    Pulmonary exam normal breath sounds clear to auscultation       Cardiovascular Exercise Tolerance: Good hypertension, (-) angina+ Peripheral Vascular Disease  (-) Past MI and (-) DOE Normal cardiovascular exam+ Valvular Problems/Murmurs  Rhythm:regular Rate:Normal     Neuro/Psych negative neurological ROS  negative psych ROS   GI/Hepatic Neg liver ROS, GERD  Controlled and Medicated,  Endo/Other  negative endocrine ROS  Renal/GU      Musculoskeletal  (+) Arthritis ,   Abdominal   Peds  Hematology negative hematology ROS (+)   Anesthesia Other Findings Past Medical History: No date: Abdominal aortic aneurysm (AAA) (HCC) No date: Arthritis No date: Asthma     Comment: managed by Raul Del No date: COPD (chronic obstructive pulmonary disease) (* No date: GERD (gastroesophageal reflux disease)     Comment: controlled only with nexium priro prevacid               failutre No date: Heart murmur No date: Hypercholesterolemia No date: Hypertension 2012: Obstructive sleep apnea of adult     Comment: on CPAP  tolerating ,  12 cm H20 , room air  No date: Tetralogy of Fallot 2005: Treadmill stress test negative for angina pect*  Past Surgical History: No date: CARDIAC CATHETERIZATION 11/16/2014: CATARACT EXTRACTION W/PHACO Right     Comment: Procedure: CATARACT EXTRACTION PHACO AND               INTRAOCULAR LENS PLACEMENT (IOC);  Surgeon:               Estill Cotta,  MD;  Location: [facility] ORS;                Service: Ophthalmology;  Laterality: Right;                UTM#[id number] H US:01:03.9 AP%:24.3% CDE:30.12 No date: EYE SURGERY 1974: TETRALOGY OF FALLOT REPAIR     Comment: [facility] [place] No date: TONSILLECTOMY  BMI    Body Mass Index:  29.97 kg/m      Reproductive/Obstetrics negative OB ROS                             Anesthesia Physical Anesthesia Plan  ASA: III  Anesthesia Plan: General LMA   Post-op Pain Management:    Induction:   Airway Management Planned:   Additional Equipment:   Intra-op Plan:   Post-operative Plan:   Informed Consent: I have reviewed the patients History and Physical, chart, labs and discussed the procedure including the risks, benefits and alternatives for the proposed anesthesia with the patient or authorized representative who has indicated his/her understanding and acceptance.   Dental Advisory Given  Plan Discussed with: Anesthesiologist, CRNA and Surgeon  Anesthesia Plan Comments:         Anesthesia Quick Evaluation

## 2016-04-20 NOTE — H&P (View-Only) (Signed)
Outpatient Surgical Follow Up  04/07/2016  Charles Mccann is an 66 y.o. male.   Chief Complaint  Patient presents with  . Follow-up    Update H&P for Left Inguinal Hernia Repair    HPI: Charles Mccann is a 66 y.o. male who returns to clinic today for evaluation of left groin and umbilical bulges. They have continued to be easily reducible. He does think they are getting larger. He states although he doesn't remember inciting event he has been moving furniture from a vacation properly to a new condo. He denies any fevers, chills, nausea, vomiting, chest pain, diarrhea, constipation. He has chronic intermittent shortness of breath secondary to COPD/asthma. He is otherwise in his usual state of health without any recent travel or changes. Since his last visit he was initially scheduled for surgery but required cardiac clearance. Cardiac clearance has been acquired and he desired to have his hernias repaired as soon as possible.  Past Medical History:  Diagnosis Date  . Arthritis   . Asthma    managed by Raul Del  . COPD (chronic obstructive pulmonary disease) ([place])   . GERD (gastroesophageal reflux disease)    controlled only with nexium priro prevacid failutre  . Heart murmur   . Hypercholesterolemia   . Hypertension   . Obstructive sleep apnea of adult 2012   on CPAP  tolerating ,  12 cm H20 , room air   . Tetralogy of Fallot   . Treadmill stress test negative for angina pectoris 2005    Past Surgical History:  Procedure Laterality Date  . CARDIAC CATHETERIZATION    . CATARACT EXTRACTION W/PHACO Right 11/16/2014   Procedure: CATARACT EXTRACTION PHACO AND INTRAOCULAR LENS PLACEMENT ([place]);  Surgeon: Estill Cotta, MD;  Location: [facility] ORS;  Service: Ophthalmology;  Laterality: Right;  SLH#[id number] H US:01:03.9 AP%:24.3% CDE:30.12  . EYE SURGERY    . TETRALOGY OF FALLOT REPAIR  [address], [place]  . TONSILLECTOMY      Family History  Problem Relation Age of Onset   . Heart disease Father 62    CAD  . Cancer Brother 65    liver CA mets to brain   . Cancer Brother     colon Ca , stomach CA    Social History:  reports that he has never smoked. He has never used smokeless tobacco. He reports that he drinks alcohol. He reports that he does not use drugs.  Allergies:  Allergies  Allergen Reactions  . Fish Allergy Swelling    Finfish - Throat Swells    Medications reviewed.    ROS A multipoint review of systems was completed, all pertinent positives and negatives are documented within the history of present illness and remainder are negative.   BP (!) 156/74   Pulse 70   Temp 98.4 F (36.9 C) (Oral)   Ht 6' (1.829 m)   Wt 100.3 kg (221 lb 3.2 oz)   BMI 30.00 kg/m   Physical Exam CONSTITUTIONAL: No acute distress. EYES: Pupils are equal, round, and reactive to light, Sclera are non-icteric. EARS, NOSE, MOUTH AND THROAT: The oropharynx is clear. The oral mucosa is pink and moist. Hearing is intact to voice. LYMPH NODES:  Lymph nodes in the neck are normal. RESPIRATORY:  Lungs are clear. There is normal respiratory effort, with equal breath sounds bilaterally, and without pathologic use of accessory muscles. CARDIOVASCULAR: Heart is regular without murmurs, gallops, or rubs. GI: The abdomen is large, soft, nontender, and  nondistended. There are no palpable masses but there is a visible umbilical and left inguinal hernia that are both soft, nontender, easily reducible. There is no hepatosplenomegaly. There are normal bowel sounds in all quadrants. GU: Rectal deferred.   MUSCULOSKELETAL: Normal muscle strength and tone. No cyanosis or edema.   SKIN: Turgor is good and there are no pathologic skin lesions or ulcers. NEUROLOGIC: Motor and sensation is grossly normal. Cranial nerves are grossly intact. PSYCH:  Oriented to person, place and time. Affect is normal.    No results found for this or any previous visit (from the past 48  hour(s)). No results found.  Assessment/Plan:  1. Left inguinal hernia Again discussed that the large size of his left inguinal hernia would be best repaired an open approach. The procedure was described in detail again to include the risks, benefits, alternatives. He voiced understanding and desires to proceed as soon as possible.  2. Umbilical hernia without obstruction and without gangrene Patient also has a symptomatic umbilical hernia. He states although the left groin bulge is larger it is the umbilical hernia that causes some more pain. He desires to have them both repaired at the same time if possible. Discussed again an open approach in detail to include the risks, benefits, alternatives. He wishes understanding and desires to proceed. We will tentatively plan to proceed with both repairs on Thursday, March 15.  A total 25 minutes was used on this encounter is greater than 50% of a user counseling her coordination of care.     Clayburn Pert, MD FACS General Surgeon  04/07/2016,6:14 PM

## 2016-04-20 NOTE — Brief Op Note (Signed)
04/20/2016  10:32 AM  PATIENT:  Mack Hook III  66 y.o. male  PRE-OPERATIVE DIAGNOSIS:  UMBILICAL HERNIA,INGUINAL HERNIA  POST-OPERATIVE DIAGNOSIS:  UMBILICAL HERNIA,INGUINAL HERNIA  PROCEDURE:  Procedure(s): HERNIA REPAIR UMBILICAL ADULT WITH MESH  (N/A) HERNIA REPAIR INGUINAL ADULT WITH MESH (Left)  SURGEON:  Surgeon(s) and Role:    * Clayburn Pert, MD - Primary    * Olean Ree, MD - Assisting  PHYSICIAN ASSISTANT:   ASSISTANTS: none   ANESTHESIA:   general  EBL:  Total I/O In: 500 [I.V.:500] Out: 15 [Blood:15]  BLOOD ADMINISTERED:none  DRAINS: none   LOCAL MEDICATIONS USED:  OTHER lidocaine and exparell  SPECIMEN:  Source of Specimen:  hernia sack  DISPOSITION OF SPECIMEN:  PATHOLOGY  COUNTS:  YES  TOURNIQUET:  * No tourniquets in log *  DICTATION: .Dragon Dictation  PLAN OF CARE: Discharge to home after PACU  PATIENT DISPOSITION:  PACU - hemodynamically stable.   Delay start of Pharmacological VTE agent (>24hrs) due to surgical blood loss or risk of bleeding: not applicable

## 2016-04-20 NOTE — Transfer of Care (Signed)
Immediate Anesthesia Transfer of Care Note  Patient: Charles Mccann  Procedure(s) Performed: Procedure(s): HERNIA REPAIR UMBILICAL ADULT WITH MESH  (N/A) HERNIA REPAIR INGUINAL ADULT WITH MESH (Left)  Patient Location: PACU  Anesthesia Type:General  Level of Consciousness: awake, alert  and oriented  Airway & Oxygen Therapy: Patient Spontanous Breathing and Patient connected to face mask oxygen  Post-op Assessment: Report given to RN and Post -op Vital signs reviewed and stable  Post vital signs: Reviewed  Last Vitals:  Vitals:   04/20/16 0614  BP: (!) 132/92  Pulse: 76  Resp: 18  Temp: 36.6 C    Last Pain:  Vitals:   04/20/16 0614  TempSrc: Tympanic  PainSc: 0-No pain         Complications: No apparent anesthesia complications

## 2016-04-20 NOTE — Anesthesia Post-op Follow-up Note (Cosign Needed)
Anesthesia QCDR form completed.        

## 2016-04-20 NOTE — Anesthesia Postprocedure Evaluation (Signed)
Anesthesia Post Note  Patient: Charles Mccann  Procedure(s) Performed: Procedure(s) (LRB): HERNIA REPAIR UMBILICAL ADULT WITH MESH  (N/A) HERNIA REPAIR INGUINAL ADULT WITH MESH (Left)  Patient location during evaluation: PACU Anesthesia Type: General Level of consciousness: awake and alert Pain management: pain level controlled Vital Signs Assessment: post-procedure vital signs reviewed and stable Respiratory status: spontaneous breathing, nonlabored ventilation, respiratory function stable and patient connected to nasal cannula oxygen Cardiovascular status: blood pressure returned to baseline and stable Postop Assessment: no signs of nausea or vomiting Anesthetic complications: no     Last Vitals:  Vitals:   04/20/16 1245 04/20/16 1315  BP: 122/76 130/64  Pulse: 71 68  Resp: 20 20  Temp: 37.1 C     Last Pain:  Vitals:   04/20/16 1315  TempSrc:   PainSc: 2                  Charles Mccann

## 2016-04-20 NOTE — Op Note (Signed)
Pre-operative Diagnosis: Left inguinal and umbilical hernia  Post-operative Diagnosis: Same  Procedure performed: Open left inguinal hernia and umbilical hernia repairs  Surgeon: Clayburn Pert   Assistants: Dr. Ardath Sax  Anesthesia: General LMA anesthesia  ASA Class: 2  Surgeon: Clayburn Pert, MD FACS  Anesthesia: Gen. with endotracheal tube  Assistant: As above  Procedure Details  The patient was seen again in the Holding Room. The benefits, complications, treatment options, and expected outcomes were discussed with the patient. The risks of bleeding, infection, recurrence of symptoms, failure to resolve symptoms,  bowel injury, any of which could require further surgery were reviewed with the patient.   The patient was taken to Operating Room, identified as Charles Mccann and the procedure verified.  A Time Out was held and the above information confirmed.  Prior to the induction of general anesthesia, antibiotic prophylaxis was administered. VTE prophylaxis was in place. General endotracheal anesthesia was then administered and tolerated well. After the induction, the abdomen was prepped with Chloraprep and draped in the sterile fashion. The patient was positioned in the supine position.  The procedure performed started with the inguinal hernia repair. The pubic tubercle and ASIS were marked and the incision was planned. The planned incision was then localized with 1% lidocaine plain. The skin was incised with 10 blade scalpel and taken down the level of the external fascia using blunt dissection and Bovie cautery. The lateral aspect of the incision was noted to be encased in scar from a prior surgery. The external anal ring was identified and the hernia was clearly seen and able to be reduced. The external oblique fascia was then opened sharply with a 15 blade scalpel and with Metzenbaum scissors. The fascia was dissected away from the deeper tissues with blunt dissection  and Bovie much cautery. The spermatic cord was then dissected out circumferentially, isolated, elevated with a Penrose drain. With the cord isolated the vas deferens was identified and the cremasteric muscles were longitudinally opened revealing a very large hernia sac and cord lipoma. The hernia sac was dissected away from the cord structures and then sharply opened. It appeared to only contain omentum which was then able to be reduced into the abdomen. A high ligation of the hernia sac with a 2-0 silk was then performed and the hernia sac with attached cord lipoma were passed off the field as a specimen. The ligated hernia sac continued to protrude and additional dissection of this running structures was undertaken. Care was taken to protect the vas deferens at all times. Finally a interrupted 0 Ethibond suture was required to reapproximate the fascial defect in order to keep the hernia sac reduced. After this was done the remainder of the cord structures were visualized again and found to be without evidence of damage. The direct space was visualized and found to be lax. This was reapproximated with interrupted Ethibond sutures after a relaxing incision was performed cephalad. An appropriate space to land a Pro grip mesh was then created with dissecting medially and laterally in the subfascial plane. A left-sided progress in spite of the field cut to the appropriate size and placed around the spermatic cord. It was able to lay flat covering the direct and indirect space with ease. It was secured to the pubic tubercle and the conjoined tendon with an interrupted 2-0 Vicryl suture. The entire field was then copious irrigated and meticulous hemostasis was ensured. Liposomal bupivacaine was then injected throughout the entire field cranial field block.  The oblique fascia was then reapproximated with a running 2-0 Vicryl suture. Scarpa's fascia and the deep dermal tissues were reapproximated with interrupted 3-0  Vicryl suture. The skin was reapproximated with a subcuticular 4-0 Monocryl.  We then turned our attention to the umbilicus. The large umbilical hernia had been reduced and the area was localized with the aforementioned lidocaine. An infra umbilical incision was made with a 15 blade scalpel and using both electrode was taken out of old fashion. The umbilical stalk was encircled gradually freed off bluntly with a Kelly forceps and dissected from the fascia with electrocautery. A 2 cm fascial defect was then identified and the hernia contents were able to be reduced. During the dissecting free of the fascia and additional 3 mm fascial defect was identified cephalad to the larger hernia defect. At this point a 6.4 cm round mesh was chosen and brought to the field. The subfascial plane was dissected out to ensure appropriate space to lay in the mesh. Mesh was easily placed into the subfascial plane without difficulty where it laid flat covering both fascial defects. It was secured in place with interrupted Ethibond suture. A figure-of-eight with the aforementioned suture was then used into the smaller defect without difficulty. The entire field was then irrigated and meticulous hemostasis was again ensured. A field block with the liposomal bupivacaine was again performed. The umbilical stalk was then reapproximated to the fascia with a 2-0 Vicryl suture. The deep dermal tissues reapproximated with interrupted 3-0 Vicryl suture. The skin was closed with a running subcuticular 4-0 Monocryl.  The entire abdomen was then cleaned off with a wet and dry lap sponge. Dermabond was used to seal both incisions. Once the Dermabond was dry at the umbilicus, and ball was placed into the umbilicus and secured with Tegaderm. Negative pressure dressing was created with the local needle and syringe. Bilateral testicles were then confirmed within the scrotum. The patient was awoken from general anesthesia and transferred to the PACU  in good condition. There are no immediate, complications and all counts were correct at the end of the procedure.  Findings: Large left inguinal hernia and umbilical hernias.   Estimated Blood Loss: 20 mL         Drains: None         Specimens: Hernia sac and cord lipoma          Complications: None                  Condition: Good   Clayburn Pert, MD, FACS

## 2016-04-20 NOTE — Progress Notes (Signed)
Dr. Rosey Bath in with patient, patient still reclined , states He is feeling better.  Will continue to monitor patient For a little while.

## 2016-04-20 NOTE — Anesthesia Procedure Notes (Signed)
Procedure Name: LMA Insertion Date/Time: 04/20/2016 7:30 AM Performed by: Marsh Dolly Pre-anesthesia Checklist: Patient identified, Patient being monitored, Timeout performed, Emergency Drugs available and Suction available Patient Re-evaluated:Patient Re-evaluated prior to inductionOxygen Delivery Method: Circle system utilized Preoxygenation: Pre-oxygenation with 100% oxygen Intubation Type: IV induction Ventilation: Mask ventilation without difficulty LMA: LMA inserted LMA Size: 5.0 Tube type: Oral Number of attempts: 1 Placement Confirmation: positive ETCO2 and breath sounds checked- equal and bilateral Tube secured with: Tape Dental Injury: Teeth and Oropharynx as per pre-operative assessment

## 2016-04-20 NOTE — Discharge Instructions (Signed)
PATIENT INSTRUCTIONS HERNIA  FOLLOW-UP:  Please make an appointment with your physician in 1 week(s).  Call your physician immediately if you have any fevers greater than 102.5, drainage from you wound that is not clear or looks infected, persistent bleeding, increasing abdominal pain, problems urinating, or persistent nausea/vomiting.    WOUND CARE INSTRUCTIONS:  Keep a dry clean dressing on the wound if there is drainage. The initial bandage may be removed after 72 hours.  If there is any significant drainage call your surgeon immediately.  If clothing rubs against the wound or causes irritation and the wound is not draining you may cover it with a dry dressing during the daytime.  Try to keep the wound dry and avoid ointments on the wound unless directed to do so.  If the wound becomes bright red and painful or starts to drain infected material that is not clear, please contact your physician immediately.  If the wound is mildly pink and has a thick firm ridge underneath it, this is normal, and is referred to as a healing ridge.  This will resolve over the next 4-6 weeks.  DIET:  You may eat any foods that you can tolerate.  It is a good idea to eat a high fiber diet and take in plenty of fluids to prevent constipation.  If you do become constipated you may want to take a mild laxative or take ducolax tablets on a daily basis until your bowel habits are regular.  Constipation can be very uncomfortable, along with straining, after recent abdominal surgery.  ACTIVITY:  You are encouraged to cough and deep breath or use your incentive spirometer if you were given one, every 15-30 minutes when awake.  This will help prevent respiratory complications and low grade fevers post-operatively.  You may want to hug a pillow when coughing and sneezing to add additional support to the surgical area which will decrease pain during these times.  You are encouraged to walk and engage in light activity for the next two  weeks.  You should not lift more than 20 pounds during this time frame as it could put you at increased risk for a hernia recurrence.  Twenty pounds is roughly equivalent to a plastic bag of groceries.   It is ok to shower in 24 hours. DO NOT SUBMERGE until instructed that it is ok by your surgeon.  MEDICATIONS:  Try to take narcotic medications and anti-inflammatory medications, such as ibuprofen, naprosyn, etc., with food.  This will minimize stomach upset from the medication.  Should you develop nausea and vomiting from the pain medication, or develop a rash, please discontinue the medication and contact your physician.  You should not drive, make important decisions, or operate machinery when taking narcotic pain medication.  QUESTIONS:  Please feel free to call your physician or the hospital operator if you have any questions, and they will be glad to assist you.    AMBULATORY SURGERY  DISCHARGE INSTRUCTIONS   1) The drugs that you were given will stay in your system until tomorrow so for the next 24 hours you should not:  A) Drive an automobile B) Make any legal decisions C) Drink any alcoholic beverage   2) You may resume regular meals tomorrow.  Today it is better to start with liquids and gradually work up to solid foods.  You may eat anything you prefer, but it is better to start with liquids, then soup and crackers, and gradually work up to solid foods.  3) Please notify your doctor immediately if you have any unusual bleeding, trouble breathing, redness and pain at the surgery site, drainage, fever, or pain not relieved by medication.    4) Additional Instructions:        Please contact your physician with any problems or Same Day Surgery at [phone], Monday through Friday 6 am to 4 pm, or [place] at [facility] number at [phone].

## 2016-04-20 NOTE — Progress Notes (Signed)
Dr. Rosey Bath in to evaluate patient prior to discharge, Patient feels he is ready to go home, feels better, No dizziness, no acute distress.  Dr. Rosey Bath ok With discharge.

## 2016-04-21 ENCOUNTER — Encounter: Payer: Self-pay | Admitting: General Surgery

## 2016-04-21 LAB — SURGICAL PATHOLOGY

## 2016-04-27 ENCOUNTER — Encounter: Payer: Self-pay | Admitting: General Surgery

## 2016-04-27 ENCOUNTER — Ambulatory Visit (INDEPENDENT_AMBULATORY_CARE_PROVIDER_SITE_OTHER): Payer: 59 | Admitting: General Surgery

## 2016-04-27 VITALS — BP 138/85 | HR 67 | Temp 98.5°F | Wt 222.0 lb

## 2016-04-27 DIAGNOSIS — Z4889 Encounter for other specified surgical aftercare: Secondary | ICD-10-CM

## 2016-04-27 NOTE — Patient Instructions (Signed)
Please call our office with any questions or concerns.  Please do not submerge in a tub, hot tub, or pool until incisions are completely sealed.  Use sun block to incision area over the next year if this area will be exposed to sun. This helps decrease scarring.  You may resume your normal activities on 05/25/2016. At that time- Listen to your body when lifting, if you have pain when lifting, stop and then try again in a few days. Pain after doing exercises or activities of daily living is normal as you get back in to your normal routine.  If you develop redness, drainage, or pain at incision sites- call our office immediately and speak with a nurse.

## 2016-04-27 NOTE — Progress Notes (Signed)
Outpatient Surgical Follow Up  04/27/2016  Charles Mccann is an 66 y.o. male.   Chief Complaint  Patient presents with  . Routine Post Op    Open left inguinal and umbilical hernia repair 93/23/55 Dr. Adonis Huguenin    HPI: 66 year old male returns to clinic 1 week status post open left inguinal hernia and umbilical hernia. Patient reports doing very well. Only having some soreness. He has not required any narcotic pain medication. He is eating well and having normal bowel function. Denies any fevers, chills, nausea, vomiting, chest pain, shortness of breath, diarrhea, constipation.  Past Medical History:  Diagnosis Date  . Abdominal aortic aneurysm (AAA) ([place])   . Arthritis   . Asthma    managed by Raul Del  . COPD (chronic obstructive pulmonary disease) ([place])   . GERD (gastroesophageal reflux disease)    controlled only with nexium priro prevacid failutre  . Heart murmur   . Hypercholesterolemia   . Hypertension   . Obstructive sleep apnea of adult 2012   on CPAP  tolerating ,  12 cm H20 , room air   . Tetralogy of Fallot   . Treadmill stress test negative for angina pectoris 2005    Past Surgical History:  Procedure Laterality Date  . CARDIAC CATHETERIZATION    . CATARACT EXTRACTION W/PHACO Right 11/16/2014   Procedure: CATARACT EXTRACTION PHACO AND INTRAOCULAR LENS PLACEMENT ([place]);  Surgeon: Estill Cotta, MD;  Location: [facility] ORS;  Service: Ophthalmology;  Laterality: Right;  DDU#[id number] H US:01:03.9 AP%:24.3% CDE:30.12  . EYE SURGERY    . INGUINAL HERNIA REPAIR Left 04/20/2016   Procedure: HERNIA REPAIR INGUINAL ADULT WITH MESH;  Surgeon: Clayburn Pert, MD;  Location: [facility] ORS;  Service: General;  Laterality: Left;  . TETRALOGY OF FALLOT REPAIR  [address], [place]  . TONSILLECTOMY    . UMBILICAL HERNIA REPAIR N/A 04/20/2016   Procedure: HERNIA REPAIR UMBILICAL ADULT WITH MESH ;  Surgeon: Clayburn Pert, MD;  Location: [facility] ORS;  Service: General;  Laterality:  N/A;    Family History  Problem Relation Age of Onset  . Heart disease Father 44    CAD  . Cancer Brother 30    liver CA mets to brain   . Cancer Brother     colon Ca , stomach CA    Social History:  reports that he has never smoked. He has never used smokeless tobacco. He reports that he drinks alcohol. He reports that he does not use drugs.  Allergies:  Allergies  Allergen Reactions  . Fish Allergy Swelling    [place] - Throat Swells (PT HAS NO PROBLEMS WITH SHELLFISH)    Medications reviewed.    ROS A multipoint review of systems was completed, all pertinent positives and negatives are documented within the history of present illness and remainder are negative   BP 138/85   Pulse 67   Temp 98.5 F (36.9 C) (Oral)   Wt 100.7 kg (222 lb)   BMI 30.11 kg/m   Physical Exam Gen.: No acute distress Chest clear to auscultation Heart: Regular rhythm Abdomen: Soft, nontender, nondistended. Well approximated umbilical and left inguinal hernias without any evidence of erythema or drainage. Dermabond still in place. Normal healing ridge present at both incision sites. No evidence of hernia recurrence to either side.    No results found for this or any previous visit (from the past 48 hour(s)). No results found.  Assessment/Plan:  1. Aftercare following surgery 66 year old male status post open umbilical  and left inguinal hernia repairs. Doing very well. Discussed appropriate returning to normal activities. He'll follow-up in clinic in 3 weeks for 1 additional wound check.     Clayburn Pert, MD [facility] General Surgeon  04/27/2016,11:44 AM

## 2016-05-01 ENCOUNTER — Other Ambulatory Visit: Payer: Self-pay | Admitting: Internal Medicine

## 2016-05-18 ENCOUNTER — Encounter: Payer: Self-pay | Admitting: General Surgery

## 2016-05-18 ENCOUNTER — Ambulatory Visit (INDEPENDENT_AMBULATORY_CARE_PROVIDER_SITE_OTHER): Payer: 59 | Admitting: General Surgery

## 2016-05-18 VITALS — BP 130/87 | HR 69 | Temp 98.1°F | Wt 222.0 lb

## 2016-05-18 DIAGNOSIS — Z4889 Encounter for other specified surgical aftercare: Secondary | ICD-10-CM

## 2016-05-18 NOTE — Patient Instructions (Signed)
Please give [place] a call if you have any questions or concerns.  At this time your are able to go golfing! :)

## 2016-05-18 NOTE — Progress Notes (Signed)
Outpatient Surgical Follow Up  05/18/2016  Charles Mccann is an 66 y.o. male.   Chief Complaint  Patient presents with  . Routine Post Op    Open left inguinal and umbilical hernia repair 13/08/65 Dr. Adonis Huguenin    HPI: 66 year old male returns to clinic for follow-up in 1 month status post open left inguinal and umbilical hernia repairs. Patient reports that he's had no pain. He is eating well and having normal bowel function. He denies any fevers, chills, nausea, vomiting, chest pain, shortness of breath, diarrhea, constipation. He states the area has continued to heal and he has not required any pain medication. Has multiple questions about getting back on the golf course.  Past Medical History:  Diagnosis Date  . Abdominal aortic aneurysm (AAA) ([place])   . Arthritis   . Asthma    managed by Raul Del  . Bronchitis, chronic obstructive, with exacerbation ([place]) 04/26/2014  . COPD (chronic obstructive pulmonary disease) ([place])   . GERD (gastroesophageal reflux disease)    controlled only with nexium priro prevacid failutre  . Heart murmur   . Hypercholesterolemia   . Hyperlipidemia with target LDL less than 100 05/28/2011  . Hypertension   . Impotence due to erectile dysfunction 04/26/2014  . Left inguinal hernia 04/07/2016  . Obstructive sleep apnea of adult 2012   on CPAP  tolerating ,  12 cm H20 , room air   . Palpitations 11/27/2011  . Tetralogy of Fallot   . Treadmill stress test negative for angina pectoris 2005  . Umbilical hernia without obstruction and without gangrene 03/10/2016    Past Surgical History:  Procedure Laterality Date  . CARDIAC CATHETERIZATION    . CATARACT EXTRACTION W/PHACO Right 11/16/2014   Procedure: CATARACT EXTRACTION PHACO AND INTRAOCULAR LENS PLACEMENT ([place]);  Surgeon: Estill Cotta, MD;  Location: [facility] ORS;  Service: Ophthalmology;  Laterality: Right;  HQI#[id number] H US:01:03.9 AP%:24.3% CDE:30.12  . EYE SURGERY    . INGUINAL HERNIA REPAIR Left  04/20/2016   Procedure: HERNIA REPAIR INGUINAL ADULT WITH MESH;  Surgeon: Clayburn Pert, MD;  Location: [facility] ORS;  Service: General;  Laterality: Left;  . TETRALOGY OF FALLOT REPAIR  [address] [place]  . TONSILLECTOMY    . UMBILICAL HERNIA REPAIR N/A 04/20/2016   Procedure: HERNIA REPAIR UMBILICAL ADULT WITH MESH ;  Surgeon: Clayburn Pert, MD;  Location: [facility] ORS;  Service: General;  Laterality: N/A;    Family History  Problem Relation Age of Onset  . Heart disease Father 78    CAD  . Cancer Brother 57    liver CA mets to brain   . Cancer Brother     colon Ca , stomach CA    Social History:  reports that he has never smoked. He has never used smokeless tobacco. He reports that he drinks alcohol. He reports that he does not use drugs.  Allergies:  Allergies  Allergen Reactions  . Fish Allergy Swelling    [place] - Throat Swells (PT HAS NO PROBLEMS WITH SHELLFISH)    Medications reviewed.    ROS A multipoint review of systems was completed, all pertinent positives and negatives are documented within the history of present illness the remainder negative.   BP 130/87   Pulse 69   Temp 98.1 F (36.7 C) (Oral)   Wt 100.7 kg (222 lb)   BMI 30.11 kg/m   Physical Exam Gen.: No acute distress Chest: Clear to auscultation Heart: Regular rhythm Abdomen: Soft, nontender, nondistended. Well  approximated umbilical and inguinal incisions. Palpable healing ridges. No evidence of infection or recurrence on exam.    No results found for this or any previous visit (from the past 33 hour(s)). No results found.  Assessment/Plan:  1. Aftercare following surgery 66 year old male 1 month status post open umbilical and inguinal hernias. Had long conversation about appropriate return to normal activities. Patient voiced understanding. He'll follow-up in clinic on an as-needed basis for any signs of infection or recurrence.     Clayburn Pert, MD FACS General  Surgeon  05/18/2016,10:29 AM

## 2016-07-12 ENCOUNTER — Other Ambulatory Visit: Payer: Self-pay | Admitting: Internal Medicine

## 2016-07-14 DIAGNOSIS — Z8774 Personal history of (corrected) congenital malformations of heart and circulatory system: Secondary | ICD-10-CM | POA: Diagnosis not present

## 2016-07-14 DIAGNOSIS — I712 Thoracic aortic aneurysm, without rupture: Secondary | ICD-10-CM | POA: Diagnosis not present

## 2016-07-14 DIAGNOSIS — I253 Aneurysm of heart: Secondary | ICD-10-CM | POA: Diagnosis not present

## 2016-07-24 DIAGNOSIS — Z8774 Personal history of (corrected) congenital malformations of heart and circulatory system: Secondary | ICD-10-CM | POA: Diagnosis not present

## 2016-07-24 DIAGNOSIS — I712 Thoracic aortic aneurysm, without rupture: Secondary | ICD-10-CM | POA: Diagnosis not present

## 2016-07-24 DIAGNOSIS — Z9889 Other specified postprocedural states: Secondary | ICD-10-CM | POA: Diagnosis not present

## 2016-10-30 ENCOUNTER — Other Ambulatory Visit: Payer: Self-pay | Admitting: Internal Medicine

## 2016-11-14 ENCOUNTER — Other Ambulatory Visit: Payer: Self-pay | Admitting: Internal Medicine

## 2016-11-21 ENCOUNTER — Other Ambulatory Visit: Payer: Self-pay | Admitting: Internal Medicine

## 2016-11-22 DIAGNOSIS — Z23 Encounter for immunization: Secondary | ICD-10-CM | POA: Diagnosis not present

## 2016-12-07 DIAGNOSIS — H2512 Age-related nuclear cataract, left eye: Secondary | ICD-10-CM | POA: Diagnosis not present

## 2017-01-10 DIAGNOSIS — J4520 Mild intermittent asthma, uncomplicated: Secondary | ICD-10-CM | POA: Diagnosis not present

## 2017-01-10 DIAGNOSIS — I712 Thoracic aortic aneurysm, without rupture: Secondary | ICD-10-CM | POA: Diagnosis not present

## 2017-01-10 DIAGNOSIS — G4733 Obstructive sleep apnea (adult) (pediatric): Secondary | ICD-10-CM | POA: Diagnosis not present

## 2017-01-18 ENCOUNTER — Other Ambulatory Visit: Payer: Self-pay | Admitting: Internal Medicine

## 2017-01-24 DIAGNOSIS — Z79899 Other long term (current) drug therapy: Secondary | ICD-10-CM | POA: Diagnosis not present

## 2017-01-24 DIAGNOSIS — Z7982 Long term (current) use of aspirin: Secondary | ICD-10-CM | POA: Diagnosis not present

## 2017-01-24 DIAGNOSIS — I712 Thoracic aortic aneurysm, without rupture: Secondary | ICD-10-CM | POA: Diagnosis not present

## 2017-01-24 DIAGNOSIS — Z8774 Personal history of (corrected) congenital malformations of heart and circulatory system: Secondary | ICD-10-CM | POA: Diagnosis not present

## 2017-01-24 DIAGNOSIS — I77810 Thoracic aortic ectasia: Secondary | ICD-10-CM | POA: Diagnosis not present

## 2017-01-24 DIAGNOSIS — I10 Essential (primary) hypertension: Secondary | ICD-10-CM | POA: Diagnosis not present

## 2017-01-24 DIAGNOSIS — Q213 Tetralogy of Fallot: Secondary | ICD-10-CM | POA: Diagnosis not present

## 2017-02-12 DIAGNOSIS — H2512 Age-related nuclear cataract, left eye: Secondary | ICD-10-CM | POA: Diagnosis not present

## 2017-02-15 ENCOUNTER — Other Ambulatory Visit: Payer: Self-pay

## 2017-02-15 ENCOUNTER — Encounter: Payer: Self-pay | Admitting: *Deleted

## 2017-02-19 NOTE — Discharge Instructions (Signed)

## 2017-02-21 ENCOUNTER — Ambulatory Visit
Admission: RE | Admit: 2017-02-21 | Discharge: 2017-02-21 | Disposition: A | Discharge status: Home / Self Care | Payer: Medicare Other | Source: Ambulatory Visit | Attending: Ophthalmology | Admitting: Ophthalmology

## 2017-02-21 ENCOUNTER — Ambulatory Visit: Payer: Medicare Other | Admitting: Anesthesiology

## 2017-02-21 ENCOUNTER — Encounter
Admission: RE | Disposition: A | Discharge status: Home / Self Care | Payer: Self-pay | Source: Ambulatory Visit | Attending: Ophthalmology

## 2017-02-21 ENCOUNTER — Encounter: Payer: Self-pay | Admitting: Ophthalmology

## 2017-02-21 DIAGNOSIS — R011 Cardiac murmur, unspecified: Secondary | ICD-10-CM | POA: Insufficient documentation

## 2017-02-21 DIAGNOSIS — J449 Chronic obstructive pulmonary disease, unspecified: Secondary | ICD-10-CM | POA: Insufficient documentation

## 2017-02-21 DIAGNOSIS — H2512 Age-related nuclear cataract, left eye: Secondary | ICD-10-CM | POA: Diagnosis not present

## 2017-02-21 DIAGNOSIS — I10 Essential (primary) hypertension: Secondary | ICD-10-CM | POA: Diagnosis not present

## 2017-02-21 DIAGNOSIS — I739 Peripheral vascular disease, unspecified: Secondary | ICD-10-CM | POA: Diagnosis not present

## 2017-02-21 DIAGNOSIS — H25012 Cortical age-related cataract, left eye: Secondary | ICD-10-CM | POA: Diagnosis not present

## 2017-02-21 DIAGNOSIS — Z9989 Dependence on other enabling machines and devices: Secondary | ICD-10-CM | POA: Insufficient documentation

## 2017-02-21 DIAGNOSIS — I712 Thoracic aortic aneurysm, without rupture: Secondary | ICD-10-CM | POA: Insufficient documentation

## 2017-02-21 DIAGNOSIS — K219 Gastro-esophageal reflux disease without esophagitis: Secondary | ICD-10-CM | POA: Insufficient documentation

## 2017-02-21 DIAGNOSIS — E7800 Pure hypercholesterolemia, unspecified: Secondary | ICD-10-CM | POA: Diagnosis not present

## 2017-02-21 DIAGNOSIS — G4730 Sleep apnea, unspecified: Secondary | ICD-10-CM | POA: Diagnosis not present

## 2017-02-21 HISTORY — PX: CATARACT EXTRACTION W/PHACO: SHX586

## 2017-02-21 SURGERY — PHACOEMULSIFICATION, CATARACT, WITH IOL INSERTION
Anesthesia: Monitor Anesthesia Care | Site: Eye | Laterality: Left | Wound class: Clean

## 2017-02-21 MED ORDER — ARMC OPHTHALMIC DILATING DROPS
1.0000 "application " | OPHTHALMIC | Status: DC | PRN
  Administered 2017-02-21 (×3): 1 via OPHTHALMIC

## 2017-02-21 MED ORDER — CEFUROXIME OPHTHALMIC INJECTION 1 MG/0.1 ML
INJECTION | OPHTHALMIC | Status: DC | PRN
  Administered 2017-02-21: 0.1 mL via INTRACAMERAL

## 2017-02-21 MED ORDER — MOXIFLOXACIN HCL 0.5 % OP SOLN
1.0000 [drp] | OPHTHALMIC | Status: DC | PRN
  Administered 2017-02-21 (×3): 1 [drp] via OPHTHALMIC

## 2017-02-21 MED ORDER — MIDAZOLAM HCL 2 MG/2ML IJ SOLN
INTRAMUSCULAR | Status: DC | PRN
  Administered 2017-02-21: 2 mg via INTRAVENOUS

## 2017-02-21 MED ORDER — BRIMONIDINE TARTRATE-TIMOLOL 0.2-0.5 % OP SOLN
OPHTHALMIC | Status: DC | PRN
  Administered 2017-02-21: 1 [drp] via OPHTHALMIC

## 2017-02-21 MED ORDER — LACTATED RINGERS IV SOLN: 10.0000 mL/h | INTRAVENOUS | Status: DC

## 2017-02-21 MED ORDER — FENTANYL CITRATE (PF) 100 MCG/2ML IJ SOLN
INTRAMUSCULAR | Status: DC | PRN
  Administered 2017-02-21: 100 ug via INTRAVENOUS

## 2017-02-21 MED ORDER — LIDOCAINE HCL (PF) 2 % IJ SOLN
INTRAOCULAR | Status: DC | PRN
  Administered 2017-02-21: 1 mL via INTRAMUSCULAR

## 2017-02-21 MED ORDER — EPINEPHRINE PF 1 MG/ML IJ SOLN
INTRAOCULAR | Status: DC | PRN
  Administered 2017-02-21: 57 mL via OPHTHALMIC

## 2017-02-21 MED ORDER — NA HYALUR & NA CHOND-NA HYALUR 0.4-0.35 ML IO KIT
PACK | INTRAOCULAR | Status: DC | PRN
  Administered 2017-02-21: 1 mL via INTRAOCULAR

## 2017-02-21 SURGICAL SUPPLY — 18 items
CANNULA ANT/CHMB 27GA (MISCELLANEOUS) ×3 IMPLANT
GLOVE SURG LX 7.5 STRW (GLOVE) ×2
GLOVE SURG LX STRL 7.5 STRW (GLOVE) ×1 IMPLANT
GLOVE SURG TRIUMPH 8.0 PF LTX (GLOVE) ×3 IMPLANT
GOWN STRL REUS W/ TWL LRG LVL3 (GOWN DISPOSABLE) ×2 IMPLANT
GOWN STRL REUS W/TWL LRG LVL3 (GOWN DISPOSABLE) ×4
LENS IOL ACRYSOF IQ 19.5 (Intraocular Lens) ×3 IMPLANT
MARKER SKIN DUAL TIP RULER LAB (MISCELLANEOUS) ×3 IMPLANT
NEEDLE FILTER BLUNT 18X 1/2SAF (NEEDLE) ×2
NEEDLE FILTER BLUNT 18X1 1/2 (NEEDLE) ×1 IMPLANT
PACK CATARACT BRASINGTON (MISCELLANEOUS) ×3 IMPLANT
PACK EYE AFTER SURG (MISCELLANEOUS) ×3 IMPLANT
PACK OPTHALMIC (MISCELLANEOUS) ×3 IMPLANT
SYR 3ML LL SCALE MARK (SYRINGE) ×3 IMPLANT
SYR 5ML LL (SYRINGE) ×3 IMPLANT
SYR TB 1ML LUER SLIP (SYRINGE) ×3 IMPLANT
WATER STERILE IRR 250ML POUR (IV SOLUTION) ×3 IMPLANT
WIPE NON LINTING 3.25X3.25 (MISCELLANEOUS) ×3 IMPLANT

## 2017-02-21 NOTE — H&P (Signed)
The History and Physical notes are on paper, have been signed, and are to be scanned. The patient remains stable and unchanged from the H&P.   Previous H&P reviewed, patient examined, and there are no changes.  Charles Mccann 02/21/2017 8:46 AM

## 2017-02-21 NOTE — Anesthesia Preprocedure Evaluation (Addendum)
Anesthesia Evaluation  Patient identified by MRN, date of birth, ID band Patient awake    Reviewed: Allergy & Precautions, NPO status , Patient's Chart, lab work & pertinent test results  History of Anesthesia Complications Negative for: history of anesthetic complications  Airway Mallampati: II  TM Distance: >3 FB Neck ROM: Full    Dental no notable dental hx.    Pulmonary asthma , sleep apnea and Continuous Positive Airway Pressure Ventilation , COPD,    Pulmonary exam normal breath sounds clear to auscultation       Cardiovascular Exercise Tolerance: Good hypertension, + Peripheral Vascular Disease (Thoracic aortic aneurysm (<5.5cm, being monitored))  + Valvular Problems/Murmurs (murmur)  Rhythm:Regular Rate:Normal + Systolic murmurs TOF s/p repair in 1974   Neuro/Psych negative neurological ROS     GI/Hepatic GERD  Controlled,  Endo/Other  negative endocrine ROS  Renal/GU negative Renal ROS     Musculoskeletal   Abdominal   Peds  Hematology negative hematology ROS (+)   Anesthesia Other Findings   Reproductive/Obstetrics                            Anesthesia Physical Anesthesia Plan  ASA: III  Anesthesia Plan: MAC   Post-op Pain Management:    Induction: Intravenous  PONV Risk Score and Plan: 1 and Midazolam  Airway Management Planned: Natural Airway  Additional Equipment:   Intra-op Plan:   Post-operative Plan:   Informed Consent: I have reviewed the patients History and Physical, chart, labs and discussed the procedure including the risks, benefits and alternatives for the proposed anesthesia with the patient or authorized representative who has indicated his/her understanding and acceptance.     Plan Discussed with: CRNA  Anesthesia Plan Comments:         Anesthesia Quick Evaluation

## 2017-02-21 NOTE — Anesthesia Procedure Notes (Signed)
Procedure Name: MAC Performed by: Hyla Coard, CRNA Pre-anesthesia Checklist: Patient identified, Emergency Drugs available, Suction available, Timeout performed and Patient being monitored Patient Re-evaluated:Patient Re-evaluated prior to induction Oxygen Delivery Method: Nasal cannula Placement Confirmation: positive ETCO2       

## 2017-02-21 NOTE — Op Note (Signed)
OPERATIVE NOTE  Charles Mccann [id number] 02/21/2017   PREOPERATIVE DIAGNOSIS:  Nuclear sclerotic cataract left eye. H25.12   POSTOPERATIVE DIAGNOSIS:    Nuclear sclerotic cataract left eye.     PROCEDURE:  Phacoemusification with posterior chamber intraocular lens placement of the left eye   LENS:   Implant Name Type Inv. Item Serial No. Manufacturer Lot No. LRB No. Used  LENS IOL ACRYSOF IQ 19.5 - [id number] Intraocular Lens LENS IOL ACRYSOF IQ 19.5 [id number] ALCON  Left 1        ULTRASOUND TIME: 20  % of 0 minutes 48 seconds, CDE 9.6  SURGEON:  Wyonia Hough, MD   ANESTHESIA:  Topical with tetracaine drops and 2% Xylocaine jelly, augmented with 1% preservative-free intracameral lidocaine.    COMPLICATIONS:  None.   DESCRIPTION OF PROCEDURE:  The patient was identified in the holding room and transported to the operating room and placed in the supine position under the operating microscope.  The left eye was identified as the operative eye and it was prepped and draped in the usual sterile ophthalmic fashion.   A 1 millimeter clear-corneal paracentesis was made at the 1:30 position.  0.5 ml of preservative-free 1% lidocaine was injected into the anterior chamber.  The anterior chamber was filled with Viscoat viscoelastic.  A 2.4 millimeter keratome was used to make a near-clear corneal incision at the 10:30 position.  .  A curvilinear capsulorrhexis was made with a cystotome and capsulorrhexis forceps.  Balanced salt solution was used to hydrodissect and hydrodelineate the nucleus.   Phacoemulsification was then used in stop and chop fashion to remove the lens nucleus and epinucleus.  The remaining cortex was then removed using the irrigation and aspiration handpiece. Provisc was then placed into the capsular bag to distend it for lens placement.  A lens was then injected into the capsular bag.  The remaining viscoelastic was aspirated.   Wounds were hydrated with  balanced salt solution.  The anterior chamber was inflated to a physiologic pressure with balanced salt solution.  No wound leaks were noted. Cefuroxime 0.1 ml of a 10mg /ml solution was injected into the anterior chamber for a dose of 1 mg of intracameral antibiotic at the completion of the case.   Timolol and Brimonidine drops were applied to the eye.  The patient was taken to the recovery room in stable condition without complications of anesthesia or surgery.  [name] 02/21/2017, 9:37 AM

## 2017-02-21 NOTE — Transfer of Care (Signed)
Immediate Anesthesia Transfer of Care Note  Patient: Charles Mccann  Procedure(s) Performed: CATARACT EXTRACTION PHACO AND INTRAOCULAR LENS PLACEMENT (IOC) LEFT (Left Eye)  Patient Location: PACU  Anesthesia Type: MAC  Level of Consciousness: awake, alert  and patient cooperative  Airway and Oxygen Therapy: Patient Spontanous Breathing and Patient connected to supplemental oxygen  Post-op Assessment: Post-op Vital signs reviewed, Patient's Cardiovascular Status Stable, Respiratory Function Stable, Patent Airway and No signs of Nausea or vomiting  Post-op Vital Signs: Reviewed and stable  Complications: No apparent anesthesia complications

## 2017-02-21 NOTE — Anesthesia Postprocedure Evaluation (Signed)
Anesthesia Post Note  Patient: Charles Mccann  Procedure(s) Performed: CATARACT EXTRACTION PHACO AND INTRAOCULAR LENS PLACEMENT (IOC) LEFT (Left Eye)  Patient location during evaluation: PACU Anesthesia Type: MAC Level of consciousness: awake and alert, oriented and patient cooperative Pain management: pain level controlled Vital Signs Assessment: post-procedure vital signs reviewed and stable Respiratory status: spontaneous breathing, nonlabored ventilation and respiratory function stable Cardiovascular status: blood pressure returned to baseline and stable Postop Assessment: adequate PO intake Anesthetic complications: no    Darrin Nipper

## 2017-03-23 ENCOUNTER — Encounter: Payer: Self-pay | Admitting: Internal Medicine

## 2017-04-04 ENCOUNTER — Telehealth: Payer: Medicare Other | Admitting: Family

## 2017-04-04 DIAGNOSIS — R6889 Other general symptoms and signs: Secondary | ICD-10-CM

## 2017-04-04 MED ORDER — OSELTAMIVIR PHOSPHATE 75 MG PO CAPS: 75.0000 mg | ORAL_CAPSULE | Freq: Two times a day (BID) | ORAL | 0 refills | Status: DC

## 2017-04-04 NOTE — Progress Notes (Signed)
E visit for Flu like symptoms   We are sorry that you are not feeling well.  Here is how we plan to help! Based on what you have shared with me it looks like you may have a respiratory virus that may be influenza.  Influenza or "the flu" is   an infection caused by a respiratory virus. The flu virus is highly contagious and persons who did not receive their yearly flu vaccination may "catch" the flu from close contact.  We have anti-viral medications to treat the viruses that cause this infection. They are not a "cure" and only shorten the course of the infection. These prescriptions are most effective when they are given within the first 2 days of "flu" symptoms. Antiviral medication are indicated if you have a high risk of complications from the flu. You should  also consider an antiviral medication if you are in close contact with someone who is at risk. These medications can help patients avoid complications from the flu  but have side effects that you should know. Possible side effects from Tamiflu or oseltamivir include nausea, vomiting, diarrhea, dizziness, headaches, eye redness, sleep problems or other respiratory symptoms. You should not take Tamiflu if you have an allergy to oseltamivir or any to the ingredients in Tamiflu.  Based upon your symptoms and potential risk factors I have prescribed Oseltamivir (Tamiflu).  It has been sent to your designated pharmacy.  You will take one 75 mg capsule orally twice a day for the next 5 days.   I sent this to [facility], [address],[place], [place]. Phone [phone]  ANYONE WHO HAS FLU SYMPTOMS SHOULD: . Stay home. The flu is highly contagious and going out or to work exposes others! . Be sure to drink plenty of fluids. Water is fine as well as fruit juices, sodas and electrolyte beverages. You may want to stay away from caffeine or alcohol. If you are nauseated, try taking small sips of liquids. How do you know if you are getting enough fluid? Your  urine should be a pale yellow or almost colorless. . Get rest. . Taking a steamy shower or using a humidifier may help nasal congestion and ease sore throat pain. Using a saline nasal spray works much the same way. . Cough drops, hard candies and sore throat lozenges may ease your cough. . Line up a caregiver. Have someone check on you regularly.   GET HELP RIGHT AWAY IF: . You cannot keep down liquids or your medications. . You become short of breath . Your fell like you are going to pass out or loose consciousness. . Your symptoms persist after you have completed your treatment plan MAKE SURE YOU   Understand these instructions.  Will watch your condition.  Will get help right away if you are not doing well or get worse.  Your e-visit answers were reviewed by a board certified advanced clinical practitioner to complete your personal care plan.  Depending on the condition, your plan could have included both over the counter or prescription medications.  If there is a problem please reply  once you have received a response from your provider.  Your safety is important to [place].  If you have drug allergies check your prescription carefully.    You can use MyChart to ask questions about today's visit, request a non-urgent call back, or ask for a work or school excuse for 24 hours related to this e-Visit. If it has been greater than 24 hours  you will need to follow up with your provider, or enter a new e-Visit to address those concerns.  You will get an e-mail in the next two days asking about your experience.  I hope that your e-visit has been valuable and will speed your recovery. Thank you for using e-visits.

## 2017-04-05 MED ORDER — OSELTAMIVIR PHOSPHATE 75 MG PO CAPS: 75.0000 mg | ORAL_CAPSULE | Freq: Two times a day (BID) | ORAL | 0 refills | Status: DC

## 2017-04-05 NOTE — Addendum Note (Signed)
Addended by: Benjamine Mola on: 04/05/2017 08:21 AM   Modules accepted: Orders

## 2017-04-16 DIAGNOSIS — D485 Neoplasm of uncertain behavior of skin: Secondary | ICD-10-CM | POA: Diagnosis not present

## 2017-04-16 DIAGNOSIS — B351 Tinea unguium: Secondary | ICD-10-CM | POA: Diagnosis not present

## 2017-04-16 DIAGNOSIS — L821 Other seborrheic keratosis: Secondary | ICD-10-CM | POA: Diagnosis not present

## 2017-04-16 DIAGNOSIS — L820 Inflamed seborrheic keratosis: Secondary | ICD-10-CM | POA: Diagnosis not present

## 2017-04-16 DIAGNOSIS — C44519 Basal cell carcinoma of skin of other part of trunk: Secondary | ICD-10-CM | POA: Diagnosis not present

## 2017-04-23 ENCOUNTER — Ambulatory Visit (INDEPENDENT_AMBULATORY_CARE_PROVIDER_SITE_OTHER): Payer: Medicare Other | Admitting: Internal Medicine

## 2017-04-23 ENCOUNTER — Encounter: Payer: Self-pay | Admitting: Internal Medicine

## 2017-04-23 VITALS — BP 130/78 | HR 65 | Temp 97.9°F | Resp 15 | Ht 72.0 in | Wt 224.8 lb

## 2017-04-23 DIAGNOSIS — J441 Chronic obstructive pulmonary disease with (acute) exacerbation: Secondary | ICD-10-CM

## 2017-04-23 DIAGNOSIS — I712 Thoracic aortic aneurysm, without rupture, unspecified: Secondary | ICD-10-CM

## 2017-04-23 DIAGNOSIS — E785 Hyperlipidemia, unspecified: Secondary | ICD-10-CM

## 2017-04-23 DIAGNOSIS — K4090 Unilateral inguinal hernia, without obstruction or gangrene, not specified as recurrent: Secondary | ICD-10-CM | POA: Diagnosis not present

## 2017-04-23 DIAGNOSIS — J45901 Unspecified asthma with (acute) exacerbation: Secondary | ICD-10-CM | POA: Diagnosis not present

## 2017-04-23 DIAGNOSIS — Z0001 Encounter for general adult medical examination with abnormal findings: Secondary | ICD-10-CM | POA: Diagnosis not present

## 2017-04-23 DIAGNOSIS — E559 Vitamin D deficiency, unspecified: Secondary | ICD-10-CM

## 2017-04-23 DIAGNOSIS — R002 Palpitations: Secondary | ICD-10-CM | POA: Diagnosis not present

## 2017-04-23 DIAGNOSIS — I10 Essential (primary) hypertension: Secondary | ICD-10-CM

## 2017-04-23 DIAGNOSIS — G4733 Obstructive sleep apnea (adult) (pediatric): Secondary | ICD-10-CM

## 2017-04-23 DIAGNOSIS — Q213 Tetralogy of Fallot: Secondary | ICD-10-CM

## 2017-04-23 DIAGNOSIS — Z125 Encounter for screening for malignant neoplasm of prostate: Secondary | ICD-10-CM | POA: Diagnosis not present

## 2017-04-23 DIAGNOSIS — R7301 Impaired fasting glucose: Secondary | ICD-10-CM | POA: Diagnosis not present

## 2017-04-23 DIAGNOSIS — K429 Umbilical hernia without obstruction or gangrene: Secondary | ICD-10-CM | POA: Diagnosis not present

## 2017-04-23 DIAGNOSIS — Z0000 Encounter for general adult medical examination without abnormal findings: Secondary | ICD-10-CM

## 2017-04-23 LAB — LIPID PANEL
Cholesterol: 167 mg/dL (ref 0–200)
HDL: 47.1 mg/dL (ref >39.00)
LDL CALC: 103 mg/dL — AB (ref 0–99)
NonHDL: 120.13
Total CHOL/HDL Ratio: 4
Triglycerides: 85 mg/dL (ref 0.0–149.0)
VLDL: 17 mg/dL (ref 0.0–40.0)

## 2017-04-23 LAB — CBC WITH DIFFERENTIAL/PLATELET
BASOS ABS: 0.1 10*3/uL (ref 0.0–0.1)
BASOS PCT: 1.8 % (ref 0.0–3.0)
EOS ABS: 0.7 10*3/uL (ref 0.0–0.7)
Eosinophils Relative: 8.9 % — ABNORMAL HIGH (ref 0.0–5.0)
HCT: 42.8 % (ref 39.0–52.0)
Hemoglobin: 14.6 g/dL (ref 13.0–17.0)
Lymphocytes Relative: 25.4 % (ref 12.0–46.0)
Lymphs Abs: 2 10*3/uL (ref 0.7–4.0)
MCHC: 34 g/dL (ref 30.0–36.0)
MCV: 91.8 fl (ref 78.0–100.0)
MONO ABS: 0.8 10*3/uL (ref 0.1–1.0)
Monocytes Relative: 10.7 % (ref 3.0–12.0)
NEUTROS ABS: 4.1 10*3/uL (ref 1.4–7.7)
NEUTROS PCT: 53.2 % (ref 43.0–77.0)
PLATELETS: 226 10*3/uL (ref 150.0–400.0)
RBC: 4.66 Mil/uL (ref 4.22–5.81)
RDW: 13.4 % (ref 11.5–15.5)
WBC: 7.8 10*3/uL (ref 4.0–10.5)

## 2017-04-23 LAB — COMPREHENSIVE METABOLIC PANEL
ALBUMIN: 4.3 g/dL (ref 3.5–5.2)
ALK PHOS: 55 U/L (ref 39–117)
ALT: 26 U/L (ref 0–53)
AST: 23 U/L (ref 0–37)
BUN: 22 mg/dL (ref 6–23)
CHLORIDE: 101 meq/L (ref 96–112)
CO2: 30 mEq/L (ref 19–32)
CREATININE: 1.12 mg/dL (ref 0.40–1.50)
Calcium: 9.9 mg/dL (ref 8.4–10.5)
GFR: 69.76 mL/min (ref >60.00)
GLUCOSE: 103 mg/dL — AB (ref 70–99)
POTASSIUM: 4.2 meq/L (ref 3.5–5.1)
SODIUM: 142 meq/L (ref 135–145)
TOTAL PROTEIN: 6.7 g/dL (ref 6.0–8.3)
Total Bilirubin: 1.4 mg/dL — ABNORMAL HIGH (ref 0.2–1.2)

## 2017-04-23 LAB — MICROALBUMIN / CREATININE URINE RATIO
CREATININE, U: 65 mg/dL
Microalb Creat Ratio: 1.1 mg/g (ref 0.0–30.0)
Microalb, Ur: <0.7 mg/dL (ref 0.0–1.9)

## 2017-04-23 LAB — PSA, MEDICARE: PSA: 2.22 ng/mL (ref 0.10–4.00)

## 2017-04-23 LAB — VITAMIN D 25 HYDROXY (VIT D DEFICIENCY, FRACTURES): VITD: 33.59 ng/mL (ref 30.00–100.00)

## 2017-04-23 LAB — TSH: TSH: 3.87 u[IU]/mL (ref 0.35–4.50)

## 2017-04-23 MED ORDER — HYDROCOD POLST-CPM POLST ER 10-8 MG/5ML PO SUER: 5.0000 mL | Freq: Every evening | ORAL | 0 refills | Status: DC | PRN

## 2017-04-23 MED ORDER — HYDROCHLOROTHIAZIDE 25 MG PO TABS: 25.0000 mg | ORAL_TABLET | Freq: Every day | ORAL | 1 refills | Status: DC

## 2017-04-23 MED ORDER — ZOSTER VAC RECOMB ADJUVANTED 50 MCG/0.5ML IM SUSR: 0.5000 mL | Freq: Once | INTRAMUSCULAR | 1 refills | Status: AC

## 2017-04-23 MED ORDER — PREDNISONE 10 MG PO TABS: ORAL_TABLET | ORAL | 0 refills | Status: DC

## 2017-04-23 MED ORDER — BENZONATATE 200 MG PO CAPS: 200.0000 mg | ORAL_CAPSULE | Freq: Two times a day (BID) | ORAL | 1 refills | Status: DC | PRN

## 2017-04-23 NOTE — Progress Notes (Signed)
Patient ID: Charles Mccann, male    DOB: [birth year]  Age: 67 y.o. MRN: [id number]  The patient is here for his welcome to  Medicare preventive examination and management of other chronic and acute problems.  Health Maintenance:  Due for Prevnar vaccine Colonoscopy done in  2015 Medicare PSA due today     Lab Results  Component Value Date   PSA 2.22 04/23/2017   PSA 1.24 02/28/2016   PSA 1.71 04/24/2014   Recent events:   Left inguinal hernia and umbilical hernia repair  Done March 2018  Left Cataract  Removed with with lens implanted Jan 2019 [place]  OSA and asthma managed by [place] Follow up on history of cardiothoracic surgical repair of Tetralogy of Fallot , post operative development of  thoracic aortic aneurysm.   Managed by [facility] with plans to repeat imaging with  CTA every 6 months   The risk factors are reflected in the social history.  The roster of all physicians providing medical care to patient - is listed in the Snapshot section of the chart.  Activities of daily living:  The patient is 100% independent in all ADLs: dressing, toileting, feeding as well as independent mobility  Home safety : The patient has smoke detectors in the home. They wear seatbelts.  There are no firearms at home. There is no violence in the home.   There is no risks for hepatitis, STDs or HIV. There is no   history of blood transfusion. They have no travel history to infectious disease endemic areas of the world.  The patient has seen their dentist in the last six month. They have seen their eye doctor in the last year. They deny  hearing difficulty with regard to whispered voices and some television programs, but notice some difficulty in rooms with increased ambient noise.   They have deferred audiologic testing in the last year.  They do not  have excessive sun exposure. Discussed the need for sun protection: hats, long sleeves and use of sunscreen if there is significant sun exposure.    Diet: the importance of a healthy diet is discussed. They do have a healthy diet.  The benefits of regular aerobic exercise were discussed. 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   Depression screen: there are no signs or vegative symptoms of depression- irritability, change in appetite, anhedonia, sadness/tearfullness.  Cognitive assessment: the patient manages all their financial and personal affairs and is actively engaged. They could relate day,date,year and events; recalled 2/3 objects at 3 minutes; performed clock-face test normally.  The following portions of the patient's history were reviewed and updated as appropriate: allergies, current medications, past family history, past medical history,  past surgical history, past social history  and problem list.  Visual acuity was not assessed per patient preference since she has regular follow up with her ophthalmologist. Hearing and body mass index were assessed and reviewed.   During the course of the visit the patient was educated and counseled about appropriate screening and preventive services including : fall prevention , diabetes screening, nutrition counseling, colorectal cancer screening, and recommended immunizations.    Living will is in place.  wife has healthcare POA.  .  CC: The primary encounter diagnosis was Palpitations. Diagnoses of Essential hypertension, Hyperlipidemia with target LDL less than 100, Vitamin D deficiency, Prostate cancer screening, Bronchitis, chronic obstructive, with exacerbation ([place]), Umbilical hernia without obstruction and without gangrene, Thoracic aortic aneurysm without rupture (  [place]), Tetralogy of Fallot, Obstructive sleep apnea of adult, Medicare welcome exam, and Left inguinal hernia were also pertinent to this visit.  1):  Persistent mild wheezing ,  Treated for asthma exacerbation which started on Feb 28 that patient states started one week prior  from a viral URI.   Initially had body aches,  Fever to 102 Was prescribed  tamiflu via E Visit . Called pulmonologist following day, prescribed  azthromycin, steroid tapering dose,  Advised no to take tamiflu.  On March 15 requested refill on xopenex, which was done by pulmonology.  using it  2 times daily,  symbicort twice daily.  Symptoms have improved but not resolve. Denies dyspnea, chest pain, but still having dry cough and wheezing   History Charles Mccann has a past medical history of Abdominal aortic aneurysm (AAA) ([place]), Arthritis, Asthma, Bronchitis, chronic obstructive, with exacerbation ([place]) (04/26/2014), COPD (chronic obstructive pulmonary disease) ([place]), GERD (gastroesophageal reflux disease), Heart murmur, Hypercholesterolemia, Hyperlipidemia with target LDL less than 100 (05/28/2011), Hypertension, Impotence due to erectile dysfunction (04/26/2014), Left inguinal hernia (04/07/2016), Obstructive sleep apnea of adult (2012), Palpitations (11/27/2011), Tetralogy of Fallot, Treadmill stress test negative for angina pectoris (3419), and Umbilical hernia without obstruction and without gangrene (03/10/2016).   He has a past surgical history that includes Tetralogy of Fallot repair [phone]); Cataract extraction w/PHACO (Right, 11/16/2014); Eye surgery; Tonsillectomy; Cardiac catheterization; Umbilical hernia repair (N/A, 04/20/2016); Inguinal hernia repair (Left, 04/20/2016); and Cataract extraction w/PHACO (Left, 02/21/2017).   His family history includes Cancer in his brother; Cancer (age of onset: 31) in his brother; Heart disease (age of onset: 70) in his father.He reports that  has never smoked. he has never used smokeless tobacco. He reports that he drinks about 8.4 oz of alcohol per week. He reports that he does not use drugs.  Outpatient Medications Prior to Visit  Medication Sig Dispense Refill  . albuterol (PROVENTIL HFA;VENTOLIN HFA) 108 (90 BASE) MCG/ACT inhaler Inhale 2 puffs into the lungs every 6 (six) hours as needed.     Marland Kitchen atorvastatin (LIPITOR) 10 MG tablet TAKE 1 TABLET BY MOUTH  DAILY 90 tablet 3  . budesonide-formoterol (SYMBICORT) 160-4.5 MCG/ACT inhaler Inhale 2 puffs into the lungs 2 (two) times daily.    . Cholecalciferol (VITAMIN D) 2000 units CAPS Take 2,000 Units by mouth every morning.     . diphenhydrAMINE (BENADRYL) 25 mg capsule Take 25 mg by mouth at bedtime.     Marland Kitchen esomeprazole (NEXIUM) 40 MG capsule Take 40 mg by mouth daily before breakfast.    . fluticasone (FLONASE) 50 MCG/ACT nasal spray Place 2 sprays into both nostrils at bedtime.     Marland Kitchen ibuprofen (ADVIL,MOTRIN) 200 MG tablet Take 400 mg by mouth every 8 (eight) hours as needed (for pain/headaches.).     Marland Kitchen meloxicam (MOBIC) 15 MG tablet Take 15 mg by mouth daily as needed for pain.    . methocarbamol (ROBAXIN) 500 MG tablet Take 500 mg by mouth 2 (two) times daily as needed for muscle spasms.    . metoprolol tartrate (LOPRESSOR) 25 MG tablet Take 25 mg by mouth daily.    . montelukast (SINGULAIR) 10 MG tablet Take 10 mg by mouth at bedtime.     . Potassium (POTASSIMIN PO) Take 1 tablet by mouth 2 (two) times daily.    . TURMERIC PO Take 1 capsule by mouth 2 (two) times daily.    Marland Kitchen VIAGRA 100 MG tablet TAKE 1 TABLET DAILY AS NEEDED 90 tablet 1  .  hydrochlorothiazide (HYDRODIURIL) 25 MG tablet TAKE 1 TABLET BY MOUTH  DAILY 90 tablet 0  . oseltamivir (TAMIFLU) 75 MG capsule Take 1 capsule (75 mg total) by mouth 2 (two) times daily. (Patient not taking: Reported on 04/23/2017) 10 capsule 0  . tetrahydrozoline 0.05 % ophthalmic solution Place 1 drop into both eyes 3 (three) times daily as needed (FOR DRY/IRRITATED EYES.).     No facility-administered medications prior to visit.     Review of Systems   Patient denies headache, fevers, malaise, unintentional weight loss, skin rash, eye pain, sinus congestion and sinus pain, sore throat, dysphagia,  hemoptysis ,dyspnea, chest pain, palpitations, orthopnea, edema, abdominal pain, nausea, melena,  diarrhea, constipation, flank pain, dysuria, hematuria, urinary  Frequency, nocturia, numbness, tingling, seizures,  Focal weakness, Loss of consciousness,  Tremor, insomnia, depression, anxiety, and suicidal ideation.      Objective:  BP 130/78 (BP Location: Left Arm, Patient Position: Sitting, Cuff Size: Normal)   Pulse 65   Temp 97.9 F (36.6 C) (Oral)   Resp 15   Ht 6' (1.829 m)   Wt 224 lb 12.8 oz (102 kg)   SpO2 97%   BMI 30.49 kg/m   Physical Exam   General appearance: alert, cooperative and appears stated age.  RR rate normal. Frequent Cough Ears: normal TM's and external ear canals both ears Throat: lips, mucosa, and tongue normal; teeth and gums normal Neck: no adenopathy, no carotid bruit, supple, symmetrical, trachea midline and thyroid not enlarged, symmetric, no tenderness/mass/nodules Back: symmetric, no curvature. ROM normal. No CVA tenderness. Lungs: mild end expiratory wheezing,  GAM bilaterally without ronchi.  Heart: regular rate and rhythm, S1, S2 normal, Grade 2 diastolic murmur heard best along left sternal border, no click, rub or gallop Abdomen: soft, non-tender; bowel sounds normal; no masses,  no organomegaly Pulses: 2+ and symmetric Skin: Skin color, texture, turgor normal. No rashes or lesions Lymph nodes: Cervical, supraclavicular, and axillary nodes normal.    Assessment & Plan:   Problem List Items Addressed This Visit    Palpitations - Primary   Relevant Orders   TSH (Completed)   CBC with Differential/Platelet (Completed)   Hypertension   Relevant Medications   hydrochlorothiazide (HYDRODIURIL) 25 MG tablet   Other Relevant Orders   Comprehensive metabolic panel (Completed)   Microalbumin / creatinine urine ratio (Completed)   Umbilical hernia without obstruction and without gangrene    S/p elective surgical repair March 2018      Tetralogy of Fallot    He underwent corrective surgery at age 41 [place] in 6 and was released from  their care until recent preoperative evaluation for hernia  Repair noted a thoracic aneurysm .       Relevant Medications   hydrochlorothiazide (HYDRODIURIL) 25 MG tablet   Obstructive sleep apnea of adult    Diagnosed by sleep study. he is wearing her CPAP every night a minimum of 6 hours per night and notes improved daytime wakefulness and decreased fatigue       Medicare welcome exam    Annual comprehensive preventive exam was done as well as an evaluation and management of acute and chronic conditions .  During the course of the visit the patient was educated and counseled about appropriate screening and preventive services including :  diabetes screening, lipid analysis with projected  10 year  risk for CAD , nutrition counseling, prostate and colorectal cancer screening, and recommended immunizations.  Printed recommendations for health maintenance screenings was given.  Left inguinal hernia    S/p elective hernia repair March 2018.      Hyperlipidemia with target LDL less than 100     his lipids are well controlled on current statin therapy and his liver enzymes are normal , no changes today.Patient reminded that liver enzymes  need to be checked every 6 months due to use of statin therapy   Lab Results  Component Value Date   CHOL 167 04/23/2017   HDL 47.10 04/23/2017   LDLCALC 103 (H) 04/23/2017   LDLDIRECT 73.6 11/27/2011   TRIG 85.0 04/23/2017   CHOLHDL 4 04/23/2017   Lab Results  Component Value Date   ALT 26 04/23/2017   AST 23 04/23/2017   ALKPHOS 55 04/23/2017   BILITOT 1.4 (H) 04/23/2017           Relevant Medications   hydrochlorothiazide (HYDRODIURIL) 25 MG tablet   Other Relevant Orders   Lipid panel (Completed)   Bronchitis, chronic obstructive, with exacerbation ([place])    Symptoms persistent despite recent treatment.  Repeat prednisone taper and cough suppressant offered       Relevant Medications   predniSONE (DELTASONE) 10 MG tablet    chlorpheniramine-HYDROcodone (TUSSIONEX PENNKINETIC ER) 10-8 MG/5ML SUER   benzonatate (TESSALON) 200 MG capsule   Aneurysm of thoracic aorta ([facility])    Found during follow up imaging of heart and great vessels for history of Tetralogy of Fallot repair.  [facility] managing with q 6 month imaging.  Discussed need for strict hypertension control.  Home bp's have been reportedly < 120/70.   No medication changes today; continue hctz and metoprolol         Relevant Medications   hydrochlorothiazide (HYDRODIURIL) 25 MG tablet    Other Visit Diagnoses    Vitamin D deficiency       Relevant Orders   VITAMIN D 25 Hydroxy (Vit-D Deficiency, Fractures) (Completed)   Prostate cancer screening       Relevant Orders   PSA, Medicare (Completed)      I have discontinued Charles Mccann "Tripp"'s tetrahydrozoline and oseltamivir. I have also changed his benzonatate and hydrochlorothiazide. Additionally, I am having him start on predniSONE, chlorpheniramine-HYDROcodone, and Zoster Vaccine Adjuvanted. Lastly, I am having him maintain his esomeprazole, fluticasone, albuterol, VIAGRA, budesonide-formoterol, diphenhydrAMINE, ibuprofen, montelukast, Potassium (POTASSIMIN PO), TURMERIC PO, Vitamin D, atorvastatin, metoprolol tartrate, meloxicam, and methocarbamol.  Meds ordered this encounter  Medications  . predniSONE (DELTASONE) 10 MG tablet    Sig: 6 tablets on Day 1 , then reduce by 1 tablet daily until gone    Dispense:  21 tablet    Refill:  0  . chlorpheniramine-HYDROcodone (TUSSIONEX PENNKINETIC ER) 10-8 MG/5ML SUER    Sig: Take 5 mLs by mouth at bedtime as needed for cough.    Dispense:  140 mL    Refill:  0  . benzonatate (TESSALON) 200 MG capsule    Sig: Take 1 capsule (200 mg total) by mouth 2 (two) times daily as needed for cough.    Dispense:  60 capsule    Refill:  1  . Zoster Vaccine Adjuvanted [facility]) injection    Sig: Inject 0.5 mLs into the muscle once for 1 dose.    Dispense:  1 each     Refill:  1  . hydrochlorothiazide (HYDRODIURIL) 25 MG tablet    Sig: Take 1 tablet (25 mg total) by mouth daily.    Dispense:  90 tablet    Refill:  1  Medications Discontinued During This Encounter  Medication Reason  . oseltamivir (TAMIFLU) 75 MG capsule Completed Course  . tetrahydrozoline 0.05 % ophthalmic solution Completed Course  . hydrochlorothiazide (HYDRODIURIL) 25 MG tablet Reorder    Follow-up: Return in about 6 months (around 10/24/2017) for NONFASTING LABS IN 6 MONTHS .   Crecencio Mc, MD

## 2017-04-23 NOTE — Patient Instructions (Addendum)
We are repeating the prednisone taper since you are still wheezing today  You can use the higher dose of tessalon dfor datyime cough (200 mg) and save the Tussionex for evening cough.  It has vicodin it it SO DO NOT DRIVE OR DRINK ALCOHOL WHEN USING   Hold off any any vaccinations for at least 2 weeks.     The ShingRx vaccine is now available in local pharmacies and is much more protective thant Zostavaxs,  It is therefore ADVISED for all interested adults over 50 to prevent shingles    RTC or to total care  in a few weeks for your last dose of Pneumonia vaccine (Peumonvax )     Return in 6 months for non fasting labs    Health Maintenance, Male A healthy lifestyle and preventive care is important for your health and wellness. Ask your health care provider about what schedule of regular examinations is right for you. What should I know about weight and diet? Eat a Healthy Diet  Eat plenty of vegetables, fruits, whole grains, low-fat dairy products, and lean protein.  Do not eat a lot of foods high in solid fats, added sugars, or salt.  Maintain a Healthy Weight Regular exercise can help you achieve or maintain a healthy weight. You should:  Do at least 150 minutes of exercise each week. The exercise should increase your heart rate and make you sweat (moderate-intensity exercise).  Do strength-training exercises at least twice a week.  Watch Your Levels of Cholesterol and Blood Lipids  Have your blood tested for lipids and cholesterol every 5 years starting at 66 years of age. If you are at high risk for heart disease, you should start having your blood tested when you are 66 years old. You may need to have your cholesterol levels checked more often if: ? Your lipid or cholesterol levels are high. ? You are older than 66 years of age. ? You are at high risk for heart disease.  What should I know about cancer screening? Many types of cancers can be detected early and may often  be prevented. Lung Cancer  You should be screened every year for lung cancer if: ? You are a current smoker who has smoked for at least 30 years. ? You are a former smoker who has quit within the past 15 years.  Talk to your health care provider about your screening options, when you should start screening, and how often you should be screened.  Colorectal Cancer  Routine colorectal cancer screening usually begins at 66 years of age and should be repeated every 5-10 years until you are 66 years old. You may need to be screened more often if early forms of precancerous polyps or small growths are found. Your health care provider may recommend screening at an earlier age if you have risk factors for colon cancer.  Your health care provider may recommend using home test kits to check for hidden blood in the stool.  A small camera at the end of a tube can be used to examine your colon (sigmoidoscopy or colonoscopy). This checks for the earliest forms of colorectal cancer.  Prostate and Testicular Cancer  Depending on your age and overall health, your health care provider may do certain tests to screen for prostate and testicular cancer.  Talk to your health care provider about any symptoms or concerns you have about testicular or prostate cancer.  Skin Cancer  Check your skin from head to toe regularly.  Tell your health care provider about any new moles or changes in moles, especially if: ? There is a change in a mole's size, shape, or color. ? You have a mole that is larger than a pencil eraser.  Always use sunscreen. Apply sunscreen liberally and repeat throughout the day.  Protect yourself by wearing long sleeves, pants, a wide-brimmed hat, and sunglasses when outside.  What should I know about heart disease, diabetes, and high blood pressure?  If you are 80-74 years of age, have your blood pressure checked every 3-5 years. If you are 58 years of age or older, have your blood  pressure checked every year. You should have your blood pressure measured twice-once when you are at a hospital or clinic, and once when you are not at a hospital or clinic. Record the average of the two measurements. To check your blood pressure when you are not at a hospital or clinic, you can use: ? An automated blood pressure machine at a pharmacy. ? A home blood pressure monitor.  Talk to your health care provider about your target blood pressure.  If you are between 22-45 years old, ask your health care provider if you should take aspirin to prevent heart disease.  Have regular diabetes screenings by checking your fasting blood sugar level. ? If you are at a normal weight and have a low risk for diabetes, have this test once every three years after the age of 60. ? If you are overweight and have a high risk for diabetes, consider being tested at a younger age or more often.  A one-time screening for abdominal aortic aneurysm (AAA) by ultrasound is recommended for men aged 39-75 years who are current or former smokers. What should I know about preventing infection? Hepatitis B If you have a higher risk for hepatitis B, you should be screened for this virus. Talk with your health care provider to find out if you are at risk for hepatitis B infection. Hepatitis C Blood testing is recommended for:  Everyone born from [birth year] through 1965.  Anyone with known risk factors for hepatitis C.  Sexually Transmitted Diseases (STDs)  You should be screened each year for STDs including gonorrhea and chlamydia if: ? You are sexually active and are younger than 66 years of age. ? You are older than 66 years of age and your health care provider tells you that you are at risk for this type of infection. ? Your sexual activity has changed since you were last screened and you are at an increased risk for chlamydia or gonorrhea. Ask your health care provider if you are at risk.  Talk with your health  care provider about whether you are at high risk of being infected with HIV. Your health care provider may recommend a prescription medicine to help prevent HIV infection.  What else can I do?  Schedule regular health, dental, and eye exams.  Stay current with your vaccines (immunizations).  Do not use any tobacco products, such as cigarettes, chewing tobacco, and e-cigarettes. If you need help quitting, ask your health care provider.  Limit alcohol intake to no more than 2 drinks per day. One drink equals 12 ounces of beer, 5 ounces of wine, or 1 ounces of hard liquor.  Do not use street drugs.  Do not share needles.  Ask your health care provider for help if you need support or information about quitting drugs.  Tell your health care provider if you often feel depressed.  Tell your health care provider if you have ever been abused or do not feel safe at home. This information is not intended to replace advice given to you by your health care provider. Make sure you discuss any questions you have with your health care provider. Document Released: 07/22/2007 Document Revised: 09/22/2015 Document Reviewed: 10/27/2014 Elsevier Interactive Patient Education  [employer].

## 2017-04-24 ENCOUNTER — Encounter: Payer: Self-pay | Admitting: Internal Medicine

## 2017-04-24 DIAGNOSIS — I712 Thoracic aortic aneurysm, without rupture, unspecified: Secondary | ICD-10-CM | POA: Insufficient documentation

## 2017-04-24 NOTE — Assessment & Plan Note (Signed)
Diagnosed by sleep study. he is wearing her CPAP every night a minimum of 6 hours per night and notes improved daytime wakefulness and decreased fatigue  

## 2017-04-24 NOTE — Assessment & Plan Note (Signed)
Symptoms persistent despite recent treatment.  Repeat prednisone taper and cough suppressant offered

## 2017-04-24 NOTE — Assessment & Plan Note (Signed)
S/p elective hernia repair March 2018.

## 2017-04-24 NOTE — Assessment & Plan Note (Signed)
S/p elective surgical repair March 2018

## 2017-04-24 NOTE — Assessment & Plan Note (Signed)

## 2017-04-24 NOTE — Assessment & Plan Note (Addendum)
Found during follow up imaging of heart and great vessels for history of Tetralogy of Fallot repair.  [facility] managing with q 6 month imaging.  Discussed need for strict hypertension control.  Home bp's have been reportedly < 120/70.   No medication changes today; continue hctz and metoprolol

## 2017-04-24 NOTE — Assessment & Plan Note (Addendum)
He underwent corrective surgery at age 67 [place] in 46 and was released from their care until recent preoperative evaluation for hernia  Repair noted a thoracic aneurysm .

## 2017-04-24 NOTE — Assessment & Plan Note (Signed)
his lipids are well controlled on current statin therapy and his liver enzymes are normal , no changes today.Patient reminded that liver enzymes  need to be checked every 6 months due to use of statin therapy   Lab Results  Component Value Date   CHOL 167 04/23/2017   HDL 47.10 04/23/2017   LDLCALC 103 (H) 04/23/2017   LDLDIRECT 73.6 11/27/2011   TRIG 85.0 04/23/2017   CHOLHDL 4 04/23/2017   Lab Results  Component Value Date   ALT 26 04/23/2017   AST 23 04/23/2017   ALKPHOS 55 04/23/2017   BILITOT 1.4 (H) 04/23/2017

## 2017-05-07 DIAGNOSIS — C44519 Basal cell carcinoma of skin of other part of trunk: Secondary | ICD-10-CM | POA: Diagnosis not present

## 2017-06-18 ENCOUNTER — Encounter: Payer: Self-pay | Admitting: Internal Medicine

## 2017-06-18 ENCOUNTER — Other Ambulatory Visit: Payer: Self-pay | Admitting: Internal Medicine

## 2017-06-18 DIAGNOSIS — Z23 Encounter for immunization: Secondary | ICD-10-CM

## 2017-06-26 ENCOUNTER — Ambulatory Visit (INDEPENDENT_AMBULATORY_CARE_PROVIDER_SITE_OTHER): Payer: Medicare Other | Admitting: Podiatry

## 2017-06-26 ENCOUNTER — Encounter: Payer: Self-pay | Admitting: Podiatry

## 2017-06-26 VITALS — BP 129/80 | HR 59 | Resp 16

## 2017-06-26 DIAGNOSIS — B351 Tinea unguium: Secondary | ICD-10-CM | POA: Diagnosis not present

## 2017-06-26 DIAGNOSIS — L603 Nail dystrophy: Secondary | ICD-10-CM | POA: Diagnosis not present

## 2017-06-26 NOTE — Progress Notes (Signed)
Subjective:  Patient ID: Charles Mccann, male    DOB: 08-03-1950,  MRN: [id number] HPI Chief Complaint  Patient presents with  . Nail Problem    Toenails bilateral - thick and discolored nails x years, tried OTC meds, dermatologist offered Rx for oral meds- declined, wants to know other options  . New Patient (Initial Visit)    67 y.o. male presents with the above complaint.   ROS: Denies fever chills nausea vomiting muscle aches pains calf pain back pain chest pain shortness of breath and headache.  Past Medical History:  Diagnosis Date  . Abdominal aortic aneurysm (AAA) ([place])   . Arthritis   . Asthma    managed by Raul Del  . Bronchitis, chronic obstructive, with exacerbation ([place]) 04/26/2014  . COPD (chronic obstructive pulmonary disease) ([place])   . GERD (gastroesophageal reflux disease)    controlled only with nexium priro prevacid failutre  . Heart murmur   . Hypercholesterolemia   . Hyperlipidemia with target LDL less than 100 05/28/2011  . Hypertension   . Impotence due to erectile dysfunction 04/26/2014  . Left inguinal hernia 04/07/2016  . Obstructive sleep apnea of adult 2012   on CPAP  tolerating ,  12 cm H20 , room air   . Palpitations 11/27/2011  . Tetralogy of Fallot   . Treadmill stress test negative for angina pectoris 2005  . Umbilical hernia without obstruction and without gangrene 03/10/2016   Past Surgical History:  Procedure Laterality Date  . CARDIAC CATHETERIZATION    . CATARACT EXTRACTION W/PHACO Right 11/16/2014   Procedure: CATARACT EXTRACTION PHACO AND INTRAOCULAR LENS PLACEMENT ([place]);  Surgeon: Estill Cotta, MD;  Location: [facility];  Service: Ophthalmology;  Laterality: Right;  YIR#[id number] H US:01:03.9 AP%:24.3% CDE:30.12  . CATARACT EXTRACTION W/PHACO Left 02/21/2017   Procedure: CATARACT EXTRACTION PHACO AND INTRAOCULAR LENS PLACEMENT ([place]) LEFT;  Surgeon: Leandrew Koyanagi, MD;  Location: [place];  Service: Ophthalmology;   Laterality: Left;  requests later  . EYE SURGERY    . INGUINAL HERNIA REPAIR Left 04/20/2016   Procedure: HERNIA REPAIR INGUINAL ADULT WITH MESH;  Surgeon: Clayburn Pert, MD;  Location: [facility];  Service: General;  Laterality: Left;  . TETRALOGY OF FALLOT REPAIR  [address], [place]  . TONSILLECTOMY    . UMBILICAL HERNIA REPAIR N/A 04/20/2016   Procedure: HERNIA REPAIR UMBILICAL ADULT WITH MESH ;  Surgeon: Clayburn Pert, MD;  Location: [facility];  Service: General;  Laterality: N/A;    Current Outpatient Medications:  .  albuterol (PROVENTIL HFA;VENTOLIN HFA) 108 (90 BASE) MCG/ACT inhaler, Inhale 2 puffs into the lungs every 6 (six) hours as needed., Disp: , Rfl:  .  atorvastatin (LIPITOR) 10 MG tablet, TAKE 1 TABLET BY MOUTH  DAILY, Disp: 90 tablet, Rfl: 3 .  budesonide-formoterol (SYMBICORT) 160-4.5 MCG/ACT inhaler, Inhale 2 puffs into the lungs 2 (two) times daily., Disp: , Rfl:  .  esomeprazole (NEXIUM) 40 MG capsule, Take 40 mg by mouth daily before breakfast., Disp: , Rfl:  .  hydrochlorothiazide (HYDRODIURIL) 25 MG tablet, Take 1 tablet (25 mg total) by mouth daily., Disp: 90 tablet, Rfl: 1 .  meloxicam (MOBIC) 15 MG tablet, Take 15 mg by mouth daily as needed for pain., Disp: , Rfl:  .  methocarbamol (ROBAXIN) 500 MG tablet, Take 500 mg by mouth 2 (two) times daily as needed for muscle spasms., Disp: , Rfl:  .  metoprolol tartrate (LOPRESSOR) 25 MG tablet, Take 25 mg by mouth daily., Disp: ,  Rfl:  .  montelukast (SINGULAIR) 10 MG tablet, Take 10 mg by mouth at bedtime. , Disp: , Rfl:  .  aspirin EC 81 MG tablet, Take by mouth., Disp: , Rfl:  .  Cholecalciferol (VITAMIN D) 2000 units CAPS, Take 2,000 Units by mouth every morning. , Disp: , Rfl:  .  fluticasone (FLONASE) 50 MCG/ACT nasal spray, Place 2 sprays into both nostrils at bedtime. , Disp: , Rfl:  .  ibuprofen (ADVIL,MOTRIN) 200 MG tablet, Take 400 mg by mouth every 8 (eight) hours as needed (for pain/headaches.). , Disp:  , Rfl:  .  Potassium (POTASSIMIN PO), Take 1 tablet by mouth 2 (two) times daily., Disp: , Rfl:  .  TURMERIC PO, Take 1 capsule by mouth 2 (two) times daily., Disp: , Rfl:   Allergies  Allergen Reactions  . Fish Allergy Swelling    [place] - Throat Swells (PT HAS NO PROBLEMS WITH SHELLFISH)   Review of Systems Objective:   Vitals:   06/26/17 1032  BP: 129/80  Pulse: (!) 59  Resp: 16    General: Well developed, nourished, in no acute distress, alert and oriented x3   Dermatological: Skin is warm, dry and supple bilateral. Nails x 10 are well maintained; remaining integument appears unremarkable at this time. There are no open sores, no preulcerative lesions, no rash or signs of infection present.  Toenails are thick yellow dystrophic onychomycotic.  Vascular: Dorsalis Pedis artery and Posterior Tibial artery pedal pulses are 2/4 bilateral with immedate capillary fill time. Pedal hair growth present. No varicosities and no lower extremity edema present bilateral.   Neruologic: Grossly intact via light touch bilateral. Vibratory intact via tuning fork bilateral. Protective threshold with Semmes Wienstein monofilament intact to all pedal sites bilateral. Patellar and Achilles deep tendon reflexes 2+ bilateral. No Babinski or clonus noted bilateral.   Musculoskeletal: No gross boney pedal deformities bilateral. No pain, crepitus, or limitation noted with foot and ankle range of motion bilateral. Muscular strength 5/5 in all groups tested bilateral.  Gait: Unassisted, Nonantalgic.    Radiographs:  None taken  Assessment & Plan:   Assessment: Nail dystrophy cannot rule out onychomycosis.    Plan: Samples of the skin and nail were taken today to be sent for pathologic evaluation.  We will follow-up with him in 1 month.  He wants to consider laser set at that point we will set him up for laser rather than calling him back in here as long as it is a dermatophyte.     Jennice Renegar [place],  [place]

## 2017-06-27 ENCOUNTER — Other Ambulatory Visit: Payer: Medicare Other

## 2017-07-18 DIAGNOSIS — R05 Cough: Secondary | ICD-10-CM | POA: Diagnosis not present

## 2017-07-18 DIAGNOSIS — J4521 Mild intermittent asthma with (acute) exacerbation: Secondary | ICD-10-CM | POA: Diagnosis not present

## 2017-07-26 DIAGNOSIS — I712 Thoracic aortic aneurysm, without rupture: Secondary | ICD-10-CM | POA: Diagnosis not present

## 2017-07-26 DIAGNOSIS — I77810 Thoracic aortic ectasia: Secondary | ICD-10-CM | POA: Diagnosis not present

## 2017-08-01 ENCOUNTER — Ambulatory Visit: Payer: Medicare Other | Admitting: Podiatry

## 2017-08-29 ENCOUNTER — Ambulatory Visit: Payer: Medicare Other | Admitting: Podiatry

## 2017-09-10 ENCOUNTER — Ambulatory Visit (INDEPENDENT_AMBULATORY_CARE_PROVIDER_SITE_OTHER): Payer: Medicare Other | Admitting: Podiatry

## 2017-09-10 ENCOUNTER — Encounter: Payer: Self-pay | Admitting: Podiatry

## 2017-09-10 DIAGNOSIS — Z79899 Other long term (current) drug therapy: Secondary | ICD-10-CM | POA: Diagnosis not present

## 2017-09-10 DIAGNOSIS — L603 Nail dystrophy: Secondary | ICD-10-CM | POA: Diagnosis not present

## 2017-09-10 MED ORDER — TERBINAFINE HCL 250 MG PO TABS: 250.0000 mg | ORAL_TABLET | Freq: Every day | ORAL | 0 refills | Status: DC

## 2017-09-10 NOTE — Patient Instructions (Signed)

## 2017-09-10 NOTE — Progress Notes (Signed)
He presents today for follow-up of his nail pathology.  Objective: Vital signs are stable he is alert and oriented x3 no pathology does demonstrate onychomycosis.  He is also recently had a liver profile performed which demonstrated normal values.  Assessment: Onychomycosis.  Plan: Start him on Lamisil 250 mg tablets 1 p.o. daily 30 in number follow-up with him in 1 month for his second liver test.

## 2017-10-13 ENCOUNTER — Other Ambulatory Visit: Payer: Self-pay | Admitting: Internal Medicine

## 2017-10-16 ENCOUNTER — Other Ambulatory Visit: Payer: Self-pay | Admitting: Internal Medicine

## 2017-10-17 ENCOUNTER — Encounter: Payer: Self-pay | Admitting: Podiatry

## 2017-10-17 ENCOUNTER — Ambulatory Visit (INDEPENDENT_AMBULATORY_CARE_PROVIDER_SITE_OTHER): Payer: Medicare Other | Admitting: Podiatry

## 2017-10-17 DIAGNOSIS — L603 Nail dystrophy: Secondary | ICD-10-CM | POA: Diagnosis not present

## 2017-10-17 DIAGNOSIS — Z79899 Other long term (current) drug therapy: Secondary | ICD-10-CM | POA: Diagnosis not present

## 2017-10-17 MED ORDER — TERBINAFINE HCL 250 MG PO TABS: 250.0000 mg | ORAL_TABLET | Freq: Every day | ORAL | 0 refills | Status: DC

## 2017-10-17 NOTE — Progress Notes (Signed)
He presents today for follow-up of his nail fungus.  He states that he is finished his first 30 days without any side effects from the medication.  Objective: Vital signs are stable he is alert and oriented x3 minimal change in physical examination.  Pulses remain palpable.  Assessment: Long-term therapy with Lamisil for onychomycosis.  Plan: I went ahead and started him on his 90 days of Lamisil but requested liver profile and I will follow-up with him should that become abnormal.  I will follow-up with him at that time otherwise 4 months.

## 2017-10-18 LAB — HEPATIC FUNCTION PANEL
ALT: 25 IU/L (ref 0–44)
AST: 23 IU/L (ref 0–40)
Albumin: 4.4 g/dL (ref 3.6–4.8)
Alkaline Phosphatase: 67 IU/L (ref 39–117)
BILIRUBIN TOTAL: 0.7 mg/dL (ref 0.0–1.2)
Bilirubin, Direct: 0.19 mg/dL (ref 0.00–0.40)
TOTAL PROTEIN: 6.3 g/dL (ref 6.0–8.5)

## 2017-10-23 ENCOUNTER — Telehealth: Payer: Self-pay | Admitting: *Deleted

## 2017-10-23 NOTE — Telephone Encounter (Signed)
Left message informing pt of Dr. Hyatt's review of results and orders. 

## 2017-10-23 NOTE — Telephone Encounter (Signed)
-----   Message from Garrel Ridgel, [place] sent at 10/18/2017  6:37 AM EDT ----- Blood work looks perfect may continue medication.

## 2017-10-24 ENCOUNTER — Other Ambulatory Visit: Payer: Medicare Other

## 2017-11-05 ENCOUNTER — Other Ambulatory Visit: Payer: Medicare Other

## 2017-11-05 DIAGNOSIS — I10 Essential (primary) hypertension: Secondary | ICD-10-CM

## 2017-11-05 DIAGNOSIS — R7301 Impaired fasting glucose: Secondary | ICD-10-CM

## 2017-11-05 DIAGNOSIS — Z23 Encounter for immunization: Secondary | ICD-10-CM

## 2017-11-05 DIAGNOSIS — E785 Hyperlipidemia, unspecified: Secondary | ICD-10-CM

## 2017-11-05 NOTE — Addendum Note (Signed)
Addended by: Arby Barrette on: 11/05/2017 09:08 AM   Modules accepted: Orders

## 2017-11-07 LAB — MEASLES/MUMPS/RUBELLA IMMUNITY
Mumps IgG: >300 AU/mL
Rubella: 19.4 index
Rubeola IgG: >300 AU/mL

## 2017-11-07 LAB — LIPID PANEL
CHOL/HDL RATIO: 3.5 (calc) (ref <5.0)
CHOLESTEROL: 150 mg/dL (ref <200)
HDL: 43 mg/dL (ref >40)
LDL Cholesterol (Calc): 92 mg/dL (calc)
NON-HDL CHOLESTEROL (CALC): 107 mg/dL (ref <130)
Triglycerides: 65 mg/dL (ref <150)

## 2017-11-07 LAB — COMPREHENSIVE METABOLIC PANEL
AG Ratio: 1.9 (calc) (ref 1.0–2.5)
ALKALINE PHOSPHATASE (APISO): 56 U/L (ref 40–115)
ALT: 20 U/L (ref 9–46)
AST: 20 U/L (ref 10–35)
Albumin: 4.2 g/dL (ref 3.6–5.1)
BUN: 21 mg/dL (ref 7–25)
CO2: 27 mmol/L (ref 20–32)
CREATININE: 1.16 mg/dL (ref 0.70–1.25)
Calcium: 9.4 mg/dL (ref 8.6–10.3)
Chloride: 102 mmol/L (ref 98–110)
GLOBULIN: 2.2 g/dL (ref 1.9–3.7)
Glucose, Bld: 94 mg/dL (ref 65–99)
POTASSIUM: 4.7 mmol/L (ref 3.5–5.3)
Sodium: 141 mmol/L (ref 135–146)
Total Bilirubin: 0.7 mg/dL (ref 0.2–1.2)
Total Protein: 6.4 g/dL (ref 6.1–8.1)

## 2017-11-07 LAB — HEMOGLOBIN A1C
Hgb A1c MFr Bld: 5.9 % of total Hgb — ABNORMAL HIGH (ref <5.7)
Mean Plasma Glucose: 123 (calc)
eAG (mmol/L): 6.8 (calc)

## 2017-11-12 DIAGNOSIS — L853 Xerosis cutis: Secondary | ICD-10-CM | POA: Diagnosis not present

## 2017-11-12 DIAGNOSIS — L820 Inflamed seborrheic keratosis: Secondary | ICD-10-CM | POA: Diagnosis not present

## 2017-11-12 DIAGNOSIS — L814 Other melanin hyperpigmentation: Secondary | ICD-10-CM | POA: Diagnosis not present

## 2017-11-12 DIAGNOSIS — L821 Other seborrheic keratosis: Secondary | ICD-10-CM | POA: Diagnosis not present

## 2017-11-12 DIAGNOSIS — Z85828 Personal history of other malignant neoplasm of skin: Secondary | ICD-10-CM | POA: Diagnosis not present

## 2017-11-12 DIAGNOSIS — D692 Other nonthrombocytopenic purpura: Secondary | ICD-10-CM | POA: Diagnosis not present

## 2017-11-12 DIAGNOSIS — L570 Actinic keratosis: Secondary | ICD-10-CM | POA: Diagnosis not present

## 2017-11-20 DIAGNOSIS — Z23 Encounter for immunization: Secondary | ICD-10-CM | POA: Diagnosis not present

## 2017-12-26 IMAGING — CR DG CHEST 2V
1 series · 2 of 2 positions shown · non-contrast
Comparison: None.

CLINICAL DATA: Preop for inguinal hernia repair

EXAM:
CHEST  2 VIEW

[Series 1: w chest pa · 0.14mm/px · 2 of 2 slices shown]
[im 1/2]
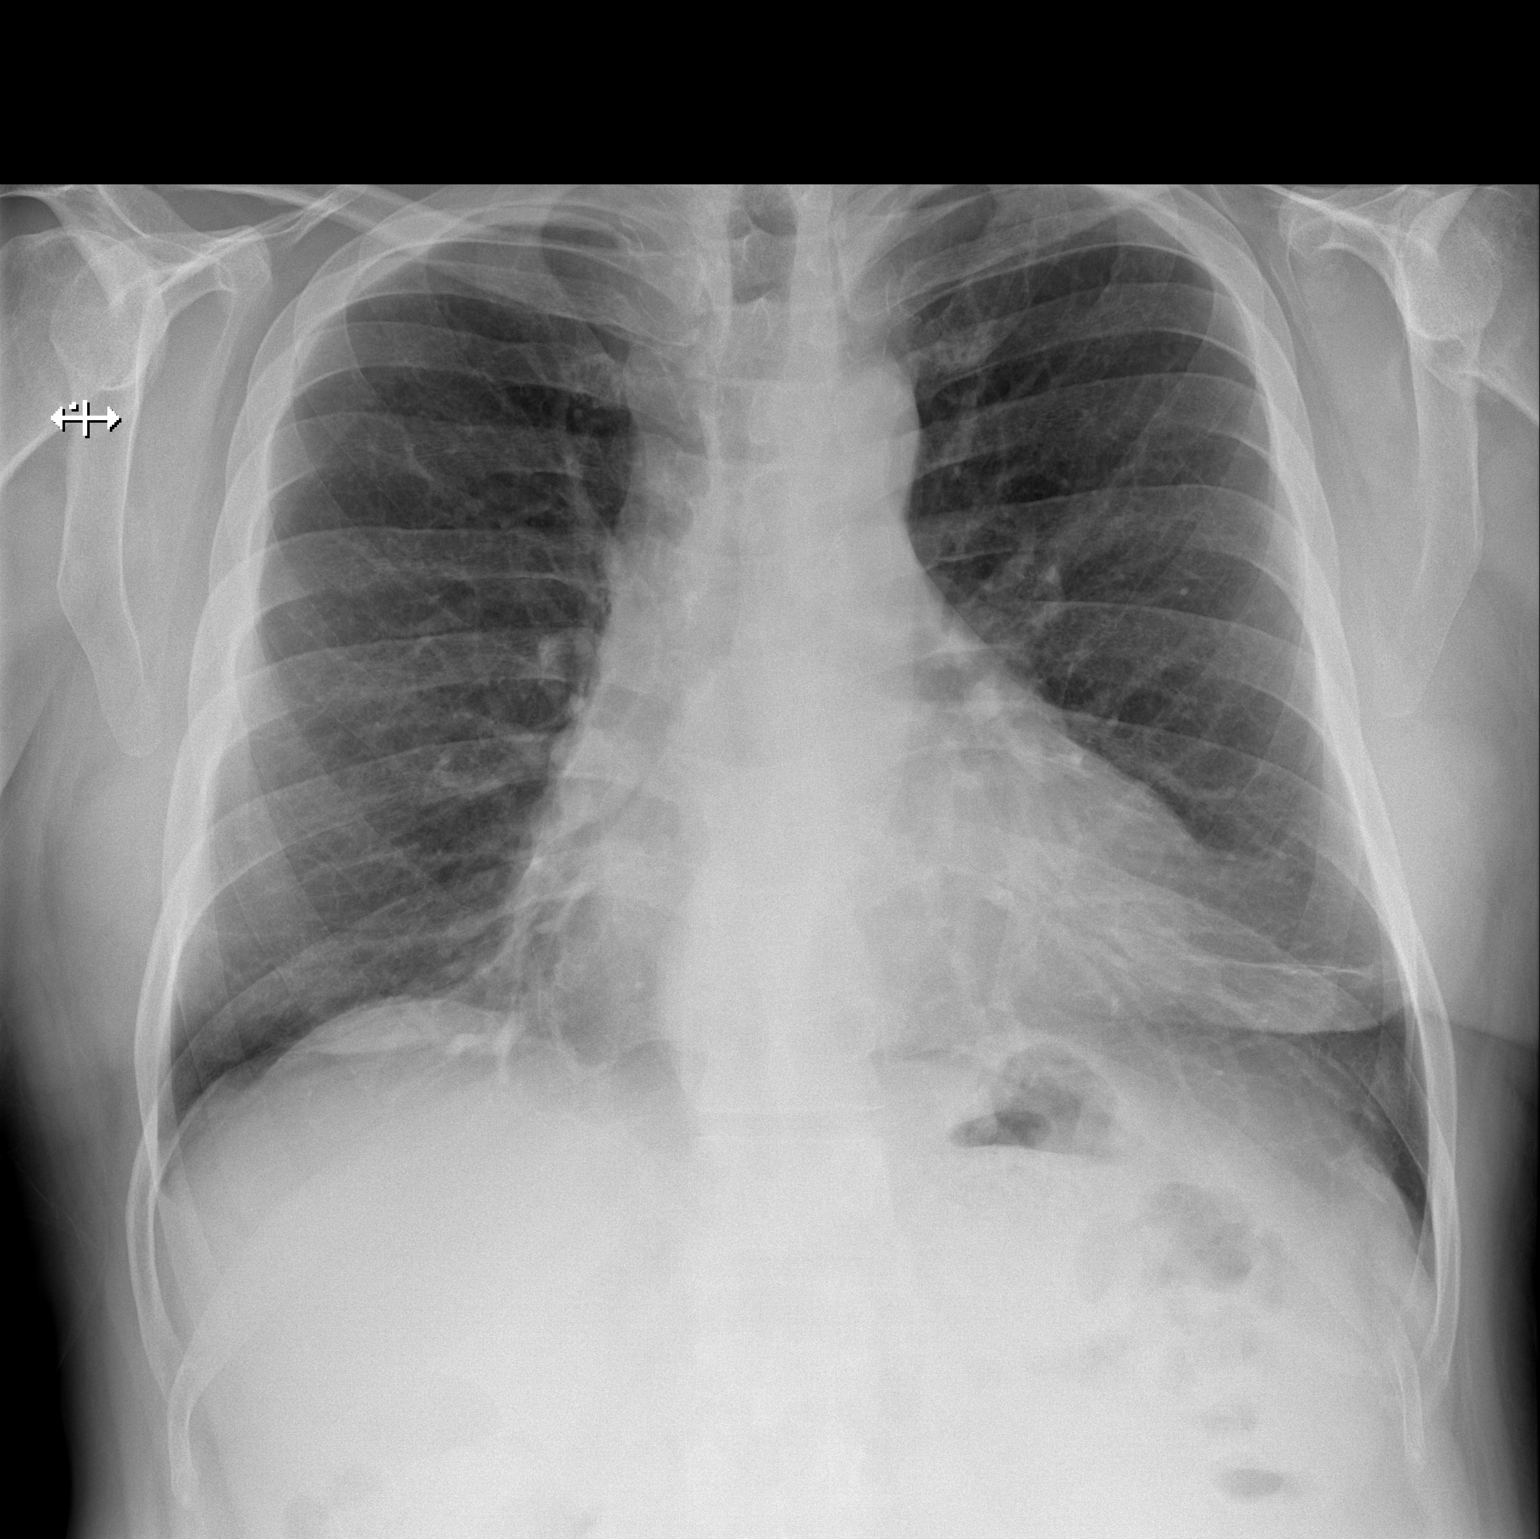
[im 2/2]
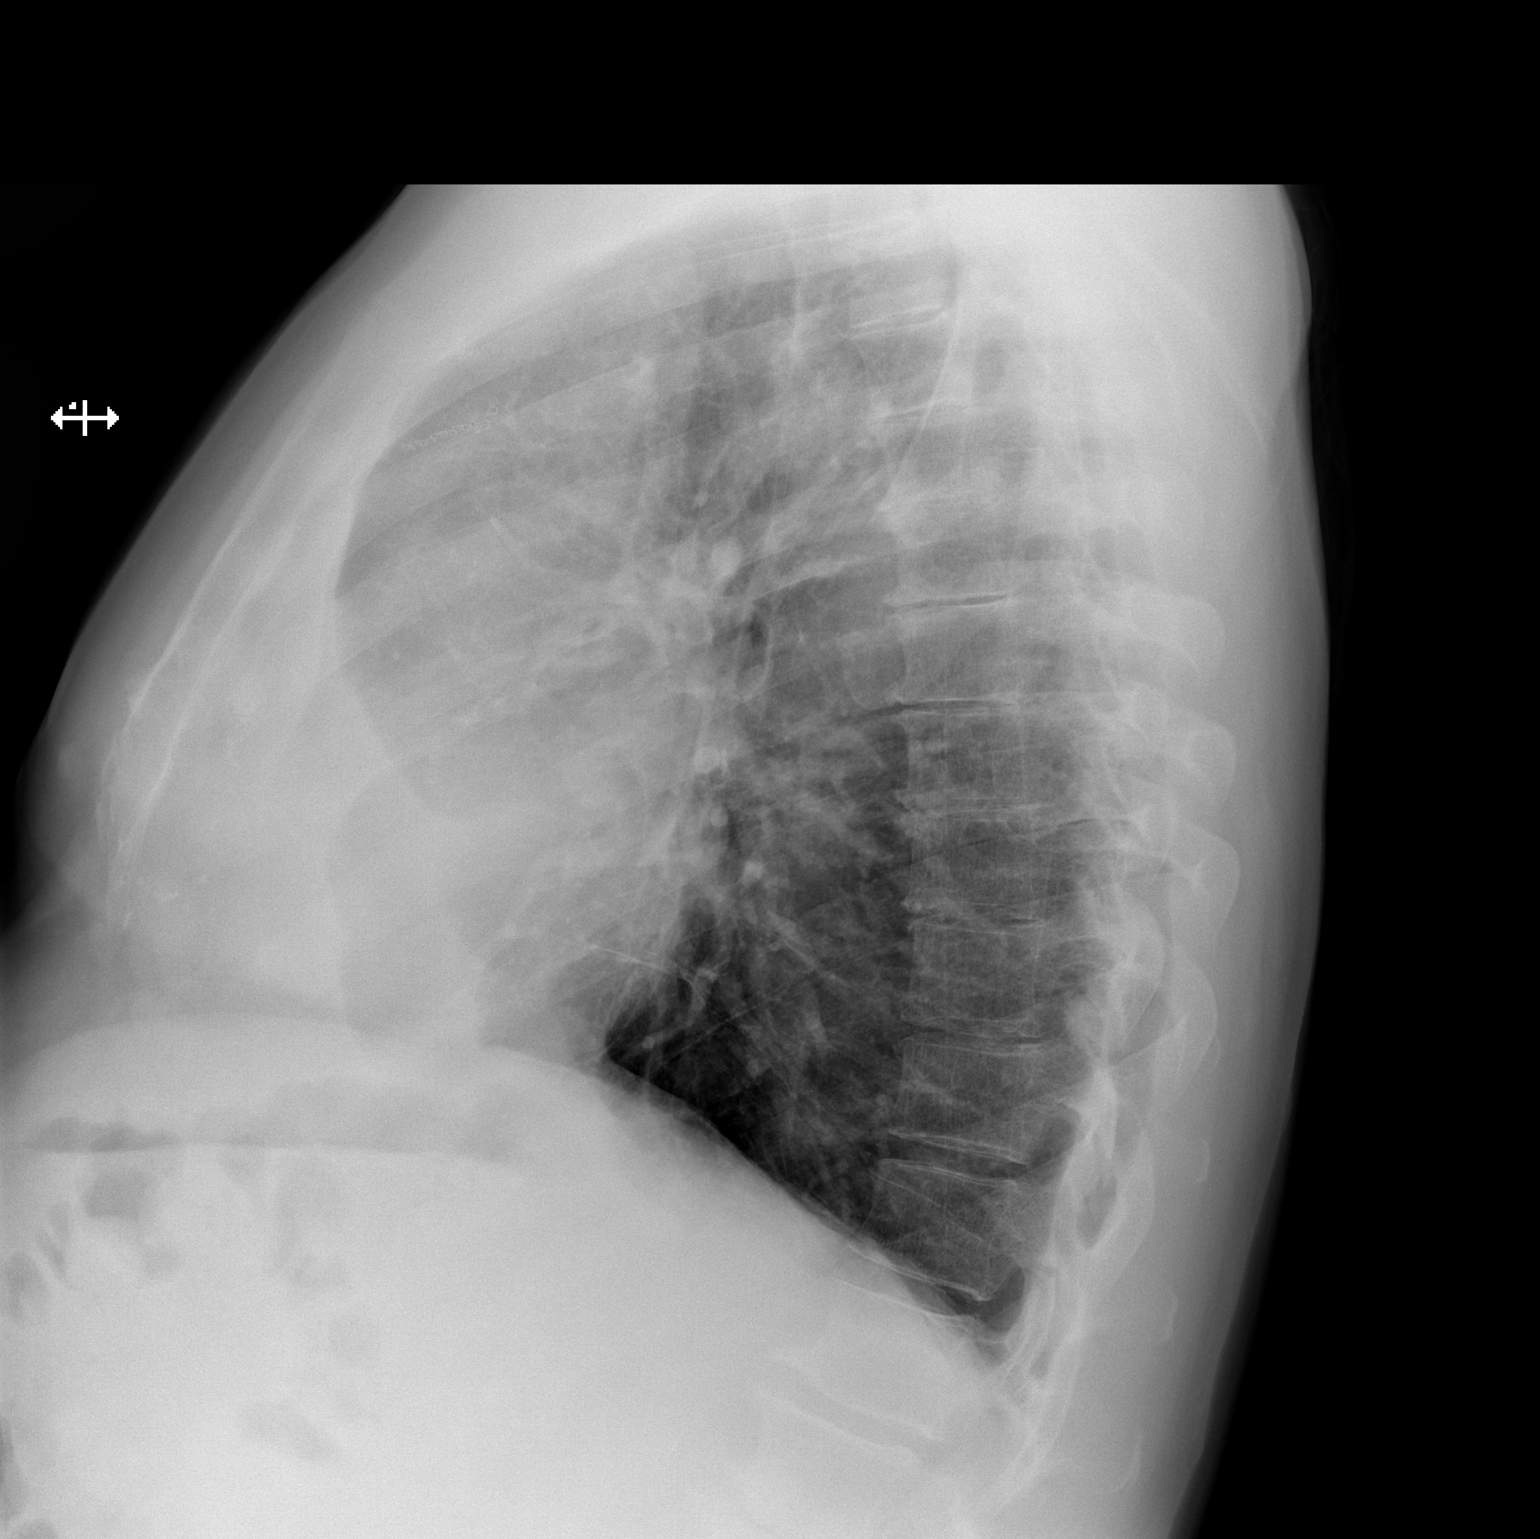

[2 of 2 positions shown; findings below may reference images not displayed]

FINDINGS: No active infiltrate or effusion is seen. Linear atelectasis or
scarring is noted in the lingula. Mediastinal and hilar contours are
unremarkable. The heart is borderline enlarged. No acute bony
abnormality is seen.
IMPRESSION: No active lung disease.  Borderline cardiomegaly.

## 2018-01-21 DIAGNOSIS — L821 Other seborrheic keratosis: Secondary | ICD-10-CM | POA: Diagnosis not present

## 2018-01-21 DIAGNOSIS — L853 Xerosis cutis: Secondary | ICD-10-CM | POA: Diagnosis not present

## 2018-01-21 DIAGNOSIS — L820 Inflamed seborrheic keratosis: Secondary | ICD-10-CM | POA: Diagnosis not present

## 2018-02-09 ENCOUNTER — Other Ambulatory Visit: Payer: Self-pay | Admitting: Internal Medicine

## 2018-02-18 ENCOUNTER — Encounter: Payer: Self-pay | Admitting: Podiatry

## 2018-02-18 ENCOUNTER — Ambulatory Visit (INDEPENDENT_AMBULATORY_CARE_PROVIDER_SITE_OTHER): Payer: Medicare Other | Admitting: Podiatry

## 2018-02-18 DIAGNOSIS — L603 Nail dystrophy: Secondary | ICD-10-CM

## 2018-02-18 MED ORDER — TERBINAFINE HCL 250 MG PO TABS: 250.0000 mg | ORAL_TABLET | Freq: Every day | ORAL | 0 refills | Status: DC

## 2018-02-18 NOTE — Patient Instructions (Signed)
Dr. Hyatt has sent over a refill for Lamisil to your pharmacy today. The instructions on your bottle will say "take 1 tablet daily", however, he would like for you to take one pill every other day. He will follow up with you in 3 months to re-evaluate your toenails. 

## 2018-02-18 NOTE — Progress Notes (Signed)
He presents today after 120 days of Lamisil.  He states that they seem to be doing much better he is very happy with the outcome thus far had really no problems other than a period where he had a dry skin small bump rash which he saw his dermatologist for and they related that it was just dry skin and should use a emollient cream.  But otherwise no complications using the medicine.  Objective: Toenails are l slowly resolving and there mycotic infection they look very good.  Assessment: Resolving onychomycosis.  Plan: I am going to request that we continue therapy with Lamisil 1 tablet every other day for the next 60 days follow-up with him in 3 months

## 2018-03-29 DIAGNOSIS — Z800 Family history of malignant neoplasm of digestive organs: Secondary | ICD-10-CM | POA: Diagnosis not present

## 2018-03-29 DIAGNOSIS — K219 Gastro-esophageal reflux disease without esophagitis: Secondary | ICD-10-CM | POA: Diagnosis not present

## 2018-03-29 DIAGNOSIS — Z86010 Personal history of colonic polyps: Secondary | ICD-10-CM | POA: Diagnosis not present

## 2018-04-22 DIAGNOSIS — Z85828 Personal history of other malignant neoplasm of skin: Secondary | ICD-10-CM | POA: Diagnosis not present

## 2018-04-22 DIAGNOSIS — L570 Actinic keratosis: Secondary | ICD-10-CM | POA: Diagnosis not present

## 2018-04-22 DIAGNOSIS — L820 Inflamed seborrheic keratosis: Secondary | ICD-10-CM | POA: Diagnosis not present

## 2018-04-24 DIAGNOSIS — I253 Aneurysm of heart: Secondary | ICD-10-CM | POA: Diagnosis not present

## 2018-04-24 DIAGNOSIS — Z8679 Personal history of other diseases of the circulatory system: Secondary | ICD-10-CM | POA: Diagnosis not present

## 2018-04-24 DIAGNOSIS — I712 Thoracic aortic aneurysm, without rupture: Secondary | ICD-10-CM | POA: Diagnosis not present

## 2018-04-29 DIAGNOSIS — Z8774 Personal history of (corrected) congenital malformations of heart and circulatory system: Secondary | ICD-10-CM | POA: Diagnosis not present

## 2018-04-29 DIAGNOSIS — I10 Essential (primary) hypertension: Secondary | ICD-10-CM | POA: Diagnosis not present

## 2018-04-29 DIAGNOSIS — I712 Thoracic aortic aneurysm, without rupture: Secondary | ICD-10-CM | POA: Diagnosis not present

## 2018-05-14 ENCOUNTER — Telehealth: Payer: Self-pay | Admitting: Podiatry

## 2018-05-14 NOTE — Telephone Encounter (Signed)
Left message informing pt I would tell Dr. Milinda Pointer of his concerns about his office visit 05/29/2018 and call with instructions.

## 2018-05-14 NOTE — Telephone Encounter (Signed)
Patient wanted to speak to Dr. Milinda Pointer did not want to come into the office to be seen for his appointment on 05/29/2018. Please call patient

## 2018-05-15 MED ORDER — TERBINAFINE HCL 250 MG PO TABS: 250.0000 mg | ORAL_TABLET | Freq: Every day | ORAL | 0 refills | Status: DC

## 2018-05-15 NOTE — Telephone Encounter (Signed)
Just give him another 30 tabs of lamisil.  He is to take it every other day unless his nails are completely clear.  I will fu with him in July.

## 2018-05-15 NOTE — Addendum Note (Signed)
Addended by: Graceann Congress D on: 05/15/2018 03:37 PM   Modules accepted: Orders

## 2018-05-15 NOTE — Telephone Encounter (Signed)
Patient was notified of Dr. Stephenie Acres recommendations and he said that his nails are not completely cleared yet and would like one more round of Lamisil.  I informed him that I would send the rx in and he would take 1 tab every other day.  He verbalized understanding and he will call back at the end of May to schedule follow up with Dr. Milinda Pointer in July.

## 2018-05-27 ENCOUNTER — Encounter: Admission: RE | Payer: Self-pay | Source: Home / Self Care

## 2018-05-27 ENCOUNTER — Ambulatory Visit
Admission: RE | Admit: 2018-05-27 | Payer: Medicare Other | Source: Home / Self Care | Admitting: Unknown Physician Specialty

## 2018-05-27 SURGERY — COLONOSCOPY WITH PROPOFOL
Anesthesia: General

## 2018-05-29 ENCOUNTER — Ambulatory Visit: Payer: Medicare Other | Admitting: Podiatry

## 2018-07-04 ENCOUNTER — Other Ambulatory Visit: Payer: Self-pay | Admitting: Internal Medicine

## 2018-07-27 ENCOUNTER — Other Ambulatory Visit: Payer: Self-pay | Admitting: Internal Medicine

## 2018-07-27 DIAGNOSIS — R7303 Prediabetes: Secondary | ICD-10-CM

## 2018-07-27 DIAGNOSIS — E785 Hyperlipidemia, unspecified: Secondary | ICD-10-CM

## 2018-07-29 DIAGNOSIS — J4520 Mild intermittent asthma, uncomplicated: Secondary | ICD-10-CM | POA: Diagnosis not present

## 2018-07-29 DIAGNOSIS — J310 Chronic rhinitis: Secondary | ICD-10-CM | POA: Diagnosis not present

## 2018-07-29 NOTE — Telephone Encounter (Signed)
REFILL ON LIPITOR  OE FOR 90 .  LABS NEEDED prior to any more refills

## 2018-07-31 NOTE — Telephone Encounter (Signed)
Lab appt has been scheduled and pt is aware of appt date and time.  

## 2018-08-13 ENCOUNTER — Other Ambulatory Visit (INDEPENDENT_AMBULATORY_CARE_PROVIDER_SITE_OTHER): Payer: Medicare Other

## 2018-08-13 ENCOUNTER — Other Ambulatory Visit: Payer: Self-pay

## 2018-08-13 DIAGNOSIS — E785 Hyperlipidemia, unspecified: Secondary | ICD-10-CM

## 2018-08-13 DIAGNOSIS — R7303 Prediabetes: Secondary | ICD-10-CM | POA: Diagnosis not present

## 2018-08-13 LAB — COMPREHENSIVE METABOLIC PANEL
ALT: 19 U/L (ref 0–53)
AST: 13 U/L (ref 0–37)
Albumin: 4.3 g/dL (ref 3.5–5.2)
Alkaline Phosphatase: 50 U/L (ref 39–117)
BUN: 22 mg/dL (ref 6–23)
CO2: 34 mEq/L — ABNORMAL HIGH (ref 19–32)
Calcium: 9.6 mg/dL (ref 8.4–10.5)
Chloride: 101 mEq/L (ref 96–112)
Creatinine, Ser: 1.23 mg/dL (ref 0.40–1.50)
GFR: 58.68 mL/min — ABNORMAL LOW (ref >60.00)
Glucose, Bld: 95 mg/dL (ref 70–99)
Potassium: 4.1 mEq/L (ref 3.5–5.1)
Sodium: 142 mEq/L (ref 135–145)
Total Bilirubin: 1 mg/dL (ref 0.2–1.2)
Total Protein: 6.2 g/dL (ref 6.0–8.3)

## 2018-08-13 LAB — HEMOGLOBIN A1C: Hgb A1c MFr Bld: 6.2 % (ref 4.6–6.5)

## 2018-08-13 LAB — LIPID PANEL
Cholesterol: 158 mg/dL (ref 0–200)
HDL: 49.1 mg/dL (ref >39.00)
LDL Cholesterol: 84 mg/dL (ref 0–99)
NonHDL: 108.84
Total CHOL/HDL Ratio: 3
Triglycerides: 124 mg/dL (ref 0.0–149.0)
VLDL: 24.8 mg/dL (ref 0.0–40.0)

## 2018-09-04 ENCOUNTER — Ambulatory Visit (INDEPENDENT_AMBULATORY_CARE_PROVIDER_SITE_OTHER): Payer: Medicare Other

## 2018-09-04 DIAGNOSIS — Z0000 Encounter for general adult medical examination without abnormal findings: Secondary | ICD-10-CM | POA: Diagnosis not present

## 2018-09-04 NOTE — Patient Instructions (Addendum)
  Mr. Baillie , Thank you for taking time to come for your Medicare Wellness Visit. I appreciate your ongoing commitment to your health goals. Please review the following plan we discussed and let me know if I can assist you in the future.   These are the goals we discussed: Goals      Patient Stated   . Follow up with Primary Care Provider (pt-stated)     Schedule physical exam Schedule colonoscopy       This is a list of the screening recommended for you and due dates:  Health Maintenance  Topic Date Due  . Colon Cancer Screening  03/28/2018  . Flu Shot  09/07/2018  . Pneumonia vaccines (2 of 2 - PPSV23) 11/21/2018  . Tetanus Vaccine  01/10/2023  .  Hepatitis C: One time screening is recommended by Center for Disease Control  (CDC) for  adults born from [birth year] through 1964.   Completed

## 2018-09-04 NOTE — Progress Notes (Signed)
Subjective:   Charles Mccann is a 68 y.o. male who presents for an Initial Medicare Annual Wellness Visit.  Review of Systems  No ROS.  Medicare Wellness Virtual Visit.  Visual/audio telehealth visit, UTA vital signs.   See social history for additional risk factors.   Cardiac Risk Factors include: advanced age (>13men, >66 women);hypertension    Objective:    Today's Vitals   There is no height or weight on file to calculate BMI.  Advanced Directives 09/04/2018 02/21/2017 04/20/2016 03/15/2016  Does Patient Have a Medical Advance Directive? Yes Yes Yes Yes  Type of Paramedic of [place];Living will [place];Living will [place];Living will [place];Living will  Does patient want to make changes to medical advance directive? No - Patient declined No - Patient declined - No - Patient declined  Copy of [place] in Chart? No - copy requested No - copy requested - No - copy requested    Current Medications (verified) Outpatient Encounter Medications as of 09/04/2018  Medication Sig  . albuterol (PROVENTIL HFA;VENTOLIN HFA) 108 (90 BASE) MCG/ACT inhaler Inhale 2 puffs into the lungs every 6 (six) hours as needed.  Marland Kitchen aspirin EC 81 MG tablet Take by mouth.  Marland Kitchen atorvastatin (LIPITOR) 10 MG tablet TAKE 1 TABLET BY MOUTH  DAILY  . budesonide-formoterol (SYMBICORT) 160-4.5 MCG/ACT inhaler Inhale 2 puffs into the lungs 2 (two) times daily.  . Cholecalciferol (VITAMIN D) 2000 units CAPS Take 2,000 Units by mouth every morning.   . fluticasone (FLONASE) 50 MCG/ACT nasal spray Place 2 sprays into both nostrils at bedtime.   . hydrochlorothiazide (HYDRODIURIL) 25 MG tablet TAKE 1 TABLET BY MOUTH  DAILY  . ibuprofen (ADVIL,MOTRIN) 200 MG tablet Take 400 mg by mouth every 8 (eight) hours as needed (for pain/headaches.).   Marland Kitchen metoprolol tartrate (LOPRESSOR) 25 MG tablet Take 25 mg by mouth daily.   . montelukast (SINGULAIR) 10 MG tablet Take 10 mg by mouth at bedtime.   Marland Kitchen esomeprazole (NEXIUM) 40 MG capsule Take 40 mg by mouth daily before breakfast.  . [DISCONTINUED] meloxicam (MOBIC) 15 MG tablet Take 15 mg by mouth daily as needed for pain.  . [DISCONTINUED] methocarbamol (ROBAXIN) 500 MG tablet Take 500 mg by mouth 2 (two) times daily as needed for muscle spasms.  . [DISCONTINUED] Potassium (POTASSIMIN PO) Take 1 tablet by mouth 2 (two) times daily.  . [DISCONTINUED] terbinafine (LAMISIL) 250 MG tablet Take 1 tablet (250 mg total) by mouth daily.  . [DISCONTINUED] TURMERIC PO Take 1 capsule by mouth 2 (two) times daily.   No facility-administered encounter medications on file as of 09/04/2018.     Allergies (verified) Fish allergy   History: Past Medical History:  Diagnosis Date  . Abdominal aortic aneurysm (AAA) ([place])   . Arthritis   . Asthma    managed by Raul Del  . Bronchitis, chronic obstructive, with exacerbation ([place]) 04/26/2014  . COPD (chronic obstructive pulmonary disease) ([place])   . GERD (gastroesophageal reflux disease)    controlled only with nexium priro prevacid failutre  . Heart murmur   . Hypercholesterolemia   . Hyperlipidemia with target LDL less than 100 05/28/2011  . Hypertension   . Impotence due to erectile dysfunction 04/26/2014  . Left inguinal hernia 04/07/2016  . Obstructive sleep apnea of adult 2012   on CPAP  tolerating ,  12 cm H20 , room air   . Palpitations 11/27/2011  .  Tetralogy of Fallot   . Treadmill stress test negative for angina pectoris 2005  . Umbilical hernia without obstruction and without gangrene 03/10/2016   Past Surgical History:  Procedure Laterality Date  . CARDIAC CATHETERIZATION    . CATARACT EXTRACTION W/PHACO Right 11/16/2014   Procedure: CATARACT EXTRACTION PHACO AND INTRAOCULAR LENS PLACEMENT ([place]);  Surgeon: Estill Cotta, MD;  Location: [facility];  Service: Ophthalmology;  Laterality: Right;  BEM#[id number] H  US:01:03.9 AP%:24.3% CDE:30.12  . CATARACT EXTRACTION W/PHACO Left 02/21/2017   Procedure: CATARACT EXTRACTION PHACO AND INTRAOCULAR LENS PLACEMENT ([place]) LEFT;  Surgeon: Leandrew Koyanagi, MD;  Location: [place];  Service: Ophthalmology;  Laterality: Left;  requests later  . EYE SURGERY    . INGUINAL HERNIA REPAIR Left 04/20/2016   Procedure: HERNIA REPAIR INGUINAL ADULT WITH MESH;  Surgeon: Clayburn Pert, MD;  Location: [facility];  Service: General;  Laterality: Left;  . TETRALOGY OF FALLOT REPAIR  [address], [place]  . TONSILLECTOMY    . UMBILICAL HERNIA REPAIR N/A 04/20/2016   Procedure: HERNIA REPAIR UMBILICAL ADULT WITH MESH ;  Surgeon: Clayburn Pert, MD;  Location: [facility];  Service: General;  Laterality: N/A;   Family History  Problem Relation Age of Onset  . Heart disease Father 58       CAD  . Cancer Brother 10       liver CA mets to brain   . Cancer Brother        colon Ca , stomach CA   Social History   Socioeconomic History  . Marital status: Married    Spouse name: Not on file  . Number of children: Not on file  . Years of education: Not on file  . Highest education level: Not on file  Occupational History  . Occupation: retired Optometrist     Comment: travels  to [place] weekly   Social Needs  . Financial resource strain: Not hard at all  . Food insecurity    Worry: Never true    Inability: Never true  . Transportation needs    Medical: No    Non-medical: No  Tobacco Use  . Smoking status: Never Smoker  . Smokeless tobacco: Never Used  Substance and Sexual Activity  . Alcohol use: Yes    Alcohol/week: 14.0 standard drinks    Types: 5 Cans of beer, 4 Shots of liquor, 5 Glasses of wine per week    Comment:    . Drug use: No  . Sexual activity: Not Currently  Lifestyle  . Physical activity    Days per week: 2 days    Minutes per session: 20 min  . Stress: Not at all  Relationships  . Social Herbalist on phone: Not on  file    Gets together: Not on file    Attends religious service: Not on file    Active member of club or organization: Not on file    Attends meetings of clubs or organizations: Not on file    Relationship status: Not on file  Other Topics Concern  . Not on file  Social History Narrative  . Not on file   Tobacco Counseling Counseling given: Not Answered   Clinical Intake:  Pre-visit preparation completed: Yes        Diabetes: No  How often do you need to have someone help you when you read instructions, pamphlets, or other written materials from your doctor or pharmacy?: 1 - Never  Interpreter Needed?:  No     Activities of Daily Living In your present state of health, do you have any difficulty performing the following activities: 09/04/2018  Hearing? N  Vision? N  Difficulty concentrating or making decisions? N  Walking or climbing stairs? N  Dressing or bathing? N  Doing errands, shopping? N  Preparing Food and eating ? N  Using the Toilet? N  In the past six months, have you accidently leaked urine? N  Do you have problems with loss of bowel control? N  Managing your Medications? N  Managing your Finances? N  Housekeeping or managing your Housekeeping? N  Some recent data might be hidden     Immunizations and Health Maintenance Immunization History  Administered Date(s) Administered  . Influenza Inj Mdck Quad Pf 11/17/2015  . Influenza Split 11/27/2011  . Influenza-Unspecified 11/25/2012, 11/23/2013, 12/26/2016, 11/20/2017  . Pneumococcal Conjugate-13 04/24/2014, 11/20/2017  . Pneumococcal Polysaccharide-23 11/27/2011  . Tdap 01/09/2013  . Zoster 11/25/2012   Health Maintenance Due  Topic Date Due  . COLONOSCOPY  03/28/2018    Patient Care Team: Crecencio Mc, MD as PCP - General (Internal Medicine)  Indicate any recent Medical Services you may have received from other than [facility] providers in the past year (date may be approximate).     Assessment:   This is a routine wellness examination for [place].  I connected with patient 09/04/18 at 11:00 AM EDT by an audio enabled telemedicine application and verified that I am speaking with the correct person using two identifiers. Patient stated full name and DOB. Patient gave permission to continue with virtual visit. Patient's location was at home and Nurse's location was at [place] office.   Health Screenings  Colonoscopy - 03/2013; plans to schedule Glaucoma -none Hearing -demonstrates normal hearing during visit. Hemoglobin A1C - 08/2018 (6.2) Cholesterol - 08/2018 Dental- visits every 6 months Vision- visits within the last 12 months.  Social  Alcohol intake -yes      Smoking history- never    Smokers in home? none Illicit drug use? none Exercise - walking Diet - regular Sexually Active -not currently BMI- discussed the importance of a healthy diet, water intake and the benefits of aerobic exercise.  Educational material provided.   Safety  Patient feels safe at home- yes Patient does have smoke detectors at home- yes Patient does wear sunscreen or protective clothing when in direct sunlight -yes Patient does wear seat belt when in a moving vehicle -yes Patient drives- yes  [id number] precautions and sickness symptoms discussed.   Activities of Daily Living Patient denies needing assistance with: driving, household chores, feeding themselves, getting from bed to chair, getting to the toilet, bathing/showering, dressing, managing money, or preparing meals.  No new identified risk were noted.    Depression Screen Patient denies losing interest in daily life, feeling hopeless, or crying easily over simple problems.   Medication-taking as directed and without issues.   Fall Screen Patient denies being afraid of falling or falling in the last year.   Memory Screen Patient is alert.  Patient denies difficulty focusing, concentrating or misplacing items. Correctly  identified the president of the [place], season and recall. Patient likes to work crossword puzzles for brain stimulation.  Immunizations The following Immunizations were discussed: Influenza, shingles, pneumonia, and tetanus.   Other Providers Patient Care Team: Crecencio Mc, MD as PCP - General (Internal Medicine)  Hearing/Vision screen  Hearing Screening   125Hz  250Hz  500Hz  1000Hz  2000Hz  3000Hz  4000Hz  6000Hz  8000Hz   Right ear:           Left ear:           Comments: Patient is able to hear conversational tones without difficulty.  No issues reported.   Vision Screening Comments: Wears corrective lenses Cataract extraction, bilateral Visual acuity not assessed, virtual visit.       Dietary issues and exercise activities discussed: Current Exercise Habits: Home exercise routine, Intensity: Mild  Goals      Patient Stated   . Follow up with Primary Care Provider (pt-stated)     Schedule physical exam Schedule colonoscopy      Depression Screen PHQ 2/9 Scores 09/04/2018 04/23/2017 02/19/2012  PHQ - 2 Score 0 0 0  PHQ- 9 Score - 1 -    Fall Risk Fall Risk  09/04/2018 04/23/2017  Falls in the past year? 0 No    Is the patient's home free of loose throw rugs in walkways, pet beds, electrical cords, etc? yes      Grab bars in the bathroom? yes      Handrails on the stairs?  yes      Adequate lighting?  yes  Cognitive Function:     6CIT Screen 09/04/2018  What Year? 0 points  What month? 0 points  What time? 0 points  Count back from 20 0 points  Months in reverse 0 points  Repeat phrase 0 points  Total Score 0    Screening Tests Health Maintenance  Topic Date Due  . COLONOSCOPY  03/28/2018  . INFLUENZA VACCINE  09/07/2018  . PNA vac Low Risk Adult (2 of 2 - PPSV23) 11/21/2018  . TETANUS/TDAP  01/10/2023  . Hepatitis C Screening  Completed       Plan:    End of life planning; Advance aging; Advanced directives discussed.  Copy of current HCPOA/Living  Will requested.    I have personally reviewed and noted the following in the patient's chart:   . Medical and social history . Use of alcohol, tobacco or illicit drugs  . Current medications and supplements . Functional ability and status . Nutritional status . Physical activity . Advanced directives . List of other physicians . Hospitalizations, surgeries, and ER visits in previous 12 months . Vitals . Screenings to include cognitive, depression, and falls . Referrals and appointments  In addition, I have reviewed and discussed with patient certain preventive protocols, quality metrics, and best practice recommendations. A written personalized care plan for preventive services as well as general preventive health recommendations were provided to patient.     Varney Biles, LPN   [id number]/[id number]

## 2018-10-24 DIAGNOSIS — D692 Other nonthrombocytopenic purpura: Secondary | ICD-10-CM | POA: Diagnosis not present

## 2018-10-24 DIAGNOSIS — L570 Actinic keratosis: Secondary | ICD-10-CM | POA: Diagnosis not present

## 2018-10-24 DIAGNOSIS — L821 Other seborrheic keratosis: Secondary | ICD-10-CM | POA: Diagnosis not present

## 2018-10-24 DIAGNOSIS — L814 Other melanin hyperpigmentation: Secondary | ICD-10-CM | POA: Diagnosis not present

## 2018-10-24 DIAGNOSIS — Z85828 Personal history of other malignant neoplasm of skin: Secondary | ICD-10-CM | POA: Diagnosis not present

## 2018-11-12 ENCOUNTER — Other Ambulatory Visit: Payer: Self-pay

## 2018-11-14 ENCOUNTER — Encounter: Payer: Self-pay | Admitting: Internal Medicine

## 2018-11-14 ENCOUNTER — Other Ambulatory Visit: Payer: Self-pay

## 2018-11-14 ENCOUNTER — Ambulatory Visit (INDEPENDENT_AMBULATORY_CARE_PROVIDER_SITE_OTHER): Payer: Medicare Other | Admitting: Internal Medicine

## 2018-11-14 VITALS — BP 150/80 | HR 56 | Temp 97.9°F | Resp 15 | Ht 72.0 in | Wt 223.8 lb

## 2018-11-14 DIAGNOSIS — E785 Hyperlipidemia, unspecified: Secondary | ICD-10-CM

## 2018-11-14 DIAGNOSIS — I712 Thoracic aortic aneurysm, without rupture, unspecified: Secondary | ICD-10-CM

## 2018-11-14 DIAGNOSIS — R7303 Prediabetes: Secondary | ICD-10-CM | POA: Diagnosis not present

## 2018-11-14 DIAGNOSIS — G4733 Obstructive sleep apnea (adult) (pediatric): Secondary | ICD-10-CM | POA: Diagnosis not present

## 2018-11-14 DIAGNOSIS — I10 Essential (primary) hypertension: Secondary | ICD-10-CM | POA: Diagnosis not present

## 2018-11-14 DIAGNOSIS — Z0000 Encounter for general adult medical examination without abnormal findings: Secondary | ICD-10-CM | POA: Diagnosis not present

## 2018-11-14 DIAGNOSIS — Z125 Encounter for screening for malignant neoplasm of prostate: Secondary | ICD-10-CM | POA: Diagnosis not present

## 2018-11-14 DIAGNOSIS — Z23 Encounter for immunization: Secondary | ICD-10-CM

## 2018-11-14 LAB — LIPID PANEL
Cholesterol: 168 mg/dL (ref 0–200)
HDL: 50.9 mg/dL (ref >39.00)
LDL Cholesterol: 93 mg/dL (ref 0–99)
NonHDL: 117.31
Total CHOL/HDL Ratio: 3
Triglycerides: 124 mg/dL (ref 0.0–149.0)
VLDL: 24.8 mg/dL (ref 0.0–40.0)

## 2018-11-14 LAB — COMPREHENSIVE METABOLIC PANEL
ALT: 28 U/L (ref 0–53)
AST: 22 U/L (ref 0–37)
Albumin: 4.4 g/dL (ref 3.5–5.2)
Alkaline Phosphatase: 51 U/L (ref 39–117)
BUN: 19 mg/dL (ref 6–23)
CO2: 33 mEq/L — ABNORMAL HIGH (ref 19–32)
Calcium: 10.1 mg/dL (ref 8.4–10.5)
Chloride: 100 mEq/L (ref 96–112)
Creatinine, Ser: 1.15 mg/dL (ref 0.40–1.50)
GFR: 63.37 mL/min (ref >60.00)
Glucose, Bld: 99 mg/dL (ref 70–99)
Potassium: 3.9 mEq/L (ref 3.5–5.1)
Sodium: 141 mEq/L (ref 135–145)
Total Bilirubin: 1.6 mg/dL — ABNORMAL HIGH (ref 0.2–1.2)
Total Protein: 6.6 g/dL (ref 6.0–8.3)

## 2018-11-14 LAB — MICROALBUMIN / CREATININE URINE RATIO
Creatinine,U: 82.5 mg/dL
Microalb Creat Ratio: 0.8 mg/g (ref 0.0–30.0)
Microalb, Ur: <0.7 mg/dL (ref 0.0–1.9)

## 2018-11-14 LAB — HEMOGLOBIN A1C: Hgb A1c MFr Bld: 6.3 % (ref 4.6–6.5)

## 2018-11-14 LAB — PSA, MEDICARE: PSA: 1.24 ng/ml (ref 0.10–4.00)

## 2018-11-14 LAB — TSH: TSH: 3.72 u[IU]/mL (ref 0.35–4.50)

## 2018-11-14 MED ORDER — MOMETASONE FUROATE 0.1 % EX CREA: 1.0000 "application " | TOPICAL_CREAM | Freq: Every day | CUTANEOUS | 0 refills | Status: DC

## 2018-11-14 NOTE — Patient Instructions (Signed)
Goal of blood pressure management is 120/70 to keep your aortic aneurysm from enlarging  Check bp at home regularly and notify me of readings  Advil, prednisone,  Salt all raise blood pressure   Walking daily will help BP and reduce risk of diabetes  Limit carbohydrate intake to roughly 60 grams daily   Preventing Type 2 Diabetes Mellitus Type 2 diabetes (type 2 diabetes mellitus) is a long-term (chronic) disease that affects blood sugar (glucose) levels. Normally, a hormone called insulin allows glucose to enter cells in the body. The cells use glucose for energy. In type 2 diabetes, one or both of these problems may be present:  The body does not make enough insulin.  The body does not respond properly to insulin that it makes (insulin resistance). Insulin resistance or lack of insulin causes excess glucose to build up in the blood instead of going into cells. As a result, high blood glucose (hyperglycemia) develops, which can cause many complications. Being overweight or obese and having an inactive (sedentary) lifestyle can increase your risk for diabetes. Type 2 diabetes can be delayed or prevented by making certain nutrition and lifestyle changes. What nutrition changes can be made?   Eat healthy meals and snacks regularly. Keep a healthy snack with you for when you get hungry between meals, such as fruit or a handful of nuts.  Eat lean meats and proteins that are low in saturated fats, such as chicken, fish, egg whites, and beans. Avoid processed meats.  Eat plenty of fruits and vegetables and plenty of grains that have not been processed (whole grains). It is recommended that you eat: ? 1?2 cups of fruit every day. ? 2?3 cups of vegetables every day. ? 6?8 oz of whole grains every day, such as oats, whole wheat, bulgur, brown rice, quinoa, and millet.  Eat low-fat dairy products, such as milk, yogurt, and cheese.  Eat foods that contain healthy fats, such as nuts, avocado,  olive oil, and canola oil.  Drink water throughout the day. Avoid drinks that contain added sugar, such as soda or sweet tea.  Follow instructions from your health care provider about specific eating or drinking restrictions.  Control how much food you eat at a time (portion size). ? Check food labels to find out the serving sizes of foods. ? Use a kitchen scale to weigh amounts of foods.  Saute or steam food instead of frying it. Cook with water or broth instead of oils or butter.  Limit your intake of: ? Salt (sodium). Have no more than 1 tsp (2,400 mg) of sodium a day. If you have heart disease or high blood pressure, have less than ? tsp (1,500 mg) of sodium a day. ? Saturated fat. This is fat that is solid at room temperature, such as butter or fat on meat. What lifestyle changes can be made? Activity   Do moderate-intensity physical activity for at least 30 minutes on at least 5 days of the week, or as much as told by your health care provider.  Ask your health care provider what activities are safe for you. A mix of physical activities may be best, such as walking, swimming, cycling, and strength training.  Try to add physical activity into your day. For example: ? Park in spots that are farther away than usual, so that you walk more. For example, park in a far corner of the parking lot when you go to the office or the grocery store. ? Take a walk  during your lunch break. ? Use stairs instead of elevators or escalators. Weight Loss  Lose weight as directed. Your health care provider can determine how much weight loss is best for you and can help you lose weight safely.  If you are overweight or obese, you may be instructed to lose at least 5?7 % of your body weight. Alcohol and Tobacco   Limit alcohol intake to no more than 1 drink a day for nonpregnant women and 2 drinks a day for men. One drink equals 12 oz of beer, 5 oz of wine, or 1 oz of hard liquor.  Do not use  any tobacco products, such as cigarettes, chewing tobacco, and e-cigarettes. If you need help quitting, ask your health care provider. Work With [place] Provider  Have your blood glucose tested regularly, as told by your health care provider.  Discuss your risk factors and how you can reduce your risk for diabetes.  Get screening tests as told by your health care provider. You may have screening tests regularly, especially if you have certain risk factors for type 2 diabetes.  Make an appointment with a diet and nutrition specialist (registered dietitian). A registered dietitian can help you make a healthy eating plan and can help you understand portion sizes and food labels. Why are these changes important?  It is possible to prevent or delay type 2 diabetes and related health problems by making lifestyle and nutrition changes.  It can be difficult to recognize signs of type 2 diabetes. The best way to avoid possible damage to your body is to take actions to prevent the disease before you develop symptoms. What can happen if changes are not made?  Your blood glucose levels may keep increasing. Having high blood glucose for a long time is dangerous. Too much glucose in your blood can damage your blood vessels, heart, kidneys, nerves, and eyes.  You may develop prediabetes or type 2 diabetes. Type 2 diabetes can lead to many chronic health problems and complications, such as: ? Heart disease. ? Stroke. ? Blindness. ? Kidney disease. ? Depression. ? Poor circulation in the feet and legs, which could lead to surgical removal (amputation) in severe cases. Where to find support  Ask your health care provider to recommend a registered dietitian, diabetes educator, or weight loss program.  Look for local or online weight loss groups.  Join a gym, fitness club, or outdoor activity group, such as a walking club. Where to find more information To learn more about diabetes and  diabetes prevention, visit:  American Diabetes Association (ADA): www.diabetes.[employer] of Diabetes and Digestive and Kidney Diseases: FindSpin.nl To learn more about healthy eating, visit:  The U.S. Department of Agriculture Scientist, research (physical sciences)), Choose My Plate: http://wiley-williams.com/  Office of Disease Prevention and Health Promotion (ODPHP), Dietary Guidelines: SurferLive.at Summary  You can reduce your risk for type 2 diabetes by increasing your physical activity, eating healthy foods, and losing weight as directed.  Talk with your health care provider about your risk for type 2 diabetes. Ask about any blood tests or screening tests that you need to have. This information is not intended to replace advice given to you by your health care provider. Make sure you discuss any questions you have with your health care provider. Document Released: 05/17/2015 Document Revised: 05/17/2018 Document Reviewed: 03/16/2015 Elsevier Patient Education  2020 [employer].

## 2018-11-14 NOTE — Progress Notes (Signed)
Patient ID: Charles Mccann, male    DOB: 21-May-1950  Age: 68 y.o. MRN: [id number]  The patient is here for annual preventive examination and management of other chronic and acute problems.  Last seen March 2019     The risk factors are reflected in the social history.  The roster of all physicians providing medical care to patient - is listed in the Snapshot section of the chart.  Activities of daily living:  The patient is 100% independent in all ADLs: dressing, toileting, feeding as well as independent mobility  Home safety : The patient has smoke detectors in the home. They wear seatbelts.  There are no firearms at home. There is no violence in the home.   There is no risks for hepatitis, STDs or HIV. There is no   history of blood transfusion. They have no travel history to infectious disease endemic areas of the world.  The patient has seen their dentist in the last six month. They have seen their eye doctor in the last year. They deny hearing difficulty with regard to whispered voices and some television programs. They have deferred audiologic testing in the last year.  They do not  have excessive sun exposure. Discussed the need for sun protection: hats, long sleeves and use of sunscreen if there is significant sun exposure.   Diet: the importance of a healthy diet is discussed. They do have a healthy diet.  The benefits of regular aerobic exercise were discussed.  Depression screen: there are no signs or vegative symptoms of depression- irritability, change in appetite, anhedonia, sadness/tearfullness.  Cognitive assessment: the patient manages all their financial and personal affairs and is actively engaged. They could relate day,date,year and events; recalled 2/3 objects at 3 minutes; performed clock-face test normally.  The following portions of the patient's history were reviewed and updated as appropriate: allergies, current medications, past family history, past medical  history,  past surgical history, past social history  and problem list.  Visual acuity was not assessed per patient preference since she has regular follow up with her ophthalmologist. Hearing and body mass index were assessed and reviewed.   During the course of the visit the patient was educated and counseled about appropriate screening and preventive services including : fall prevention , diabetes screening, nutrition counseling, colorectal cancer screening, and recommended immunizations.    CC: The primary encounter diagnosis was Prediabetes. Diagnoses of Hyperlipidemia with target LDL less than 100, Prostate cancer screening, Elevated blood pressure reading in office with diagnosis of hypertension, Need for immunization against influenza, Obstructive sleep apnea of adult, Essential hypertension, and Thoracic aortic aneurysm without rupture [facility]) were also pertinent to this visit.  Overweight:  Weight stable but not exercising due to COVID 19   Aortic  aneurysm : imaged every June nno change  Hypertension: patient dose not check his BP at home.  Takes his medications as directed.   History Charles Mccann has a past medical history of Abdominal aortic aneurysm (AAA) ([place]), Arthritis, Asthma, Bronchitis, chronic obstructive, with exacerbation ([place]) (04/26/2014), COPD (chronic obstructive pulmonary disease) ([place]), GERD (gastroesophageal reflux disease), Heart murmur, Hypercholesterolemia, Hyperlipidemia with target LDL less than 100 (05/28/2011), Hypertension, Impotence due to erectile dysfunction (04/26/2014), Left inguinal hernia (04/07/2016), Obstructive sleep apnea of adult (2012), Palpitations (11/27/2011), Tetralogy of Fallot, Treadmill stress test negative for angina pectoris ([id number]), and Umbilical hernia without obstruction and without gangrene (03/10/2016).   He has a past surgical history that includes Tetralogy of Fallot repair [phone]); Cataract extraction  w/PHACO (Right, 11/16/2014); Eye surgery;  Tonsillectomy; Cardiac catheterization; Umbilical hernia repair (N/A, 04/20/2016); Inguinal hernia repair (Left, 04/20/2016); and Cataract extraction w/PHACO (Left, 02/21/2017).   His family history includes Cancer in his brother; Cancer (age of onset: 39) in his brother; Heart disease (age of onset: 4) in his father.He reports that he has never smoked. He has never used smokeless tobacco. He reports current alcohol use of about 14.0 standard drinks of alcohol per week. He reports that he does not use drugs.  Outpatient Medications Prior to Visit  Medication Sig Dispense Refill  . albuterol (PROVENTIL HFA;VENTOLIN HFA) 108 (90 BASE) MCG/ACT inhaler Inhale 2 puffs into the lungs every 6 (six) hours as needed.    Marland Kitchen aspirin EC 81 MG tablet Take by mouth.    Marland Kitchen atorvastatin (LIPITOR) 10 MG tablet TAKE 1 TABLET BY MOUTH  DAILY 90 tablet 1  . azelastine (ASTELIN) 0.1 % nasal spray Place into the nose.    . budesonide-formoterol (SYMBICORT) 160-4.5 MCG/ACT inhaler Inhale 2 puffs into the lungs 2 (two) times daily.    . Cholecalciferol (VITAMIN D) 2000 units CAPS Take 2,000 Units by mouth every morning.     . fluticasone (FLONASE) 50 MCG/ACT nasal spray Place 2 sprays into both nostrils at bedtime.     . hydrochlorothiazide (HYDRODIURIL) 25 MG tablet TAKE 1 TABLET BY MOUTH  DAILY 90 tablet 1  . ibuprofen (ADVIL,MOTRIN) 200 MG tablet Take 400 mg by mouth every 8 (eight) hours as needed (for pain/headaches.).     Marland Kitchen metoprolol tartrate (LOPRESSOR) 25 MG tablet Take 25 mg by mouth daily.    . montelukast (SINGULAIR) 10 MG tablet Take 10 mg by mouth at bedtime.     Marland Kitchen omeprazole (PRILOSEC) 40 MG capsule Take by mouth.    . predniSONE (DELTASONE) 10 MG tablet Take by mouth.    . esomeprazole (NEXIUM) 40 MG capsule Take 40 mg by mouth daily before breakfast.     No facility-administered medications prior to visit.     Review of Systems   Patient denies headache, fevers, malaise, unintentional weight loss,  skin rash, eye pain, sinus congestion and sinus pain, sore throat, dysphagia,  hemoptysis , cough, dyspnea, wheezing, chest pain, palpitations, orthopnea, edema, abdominal pain, nausea, melena, diarrhea, constipation, flank pain, dysuria, hematuria, urinary  Frequency, nocturia, numbness, tingling, seizures,  Focal weakness, Loss of consciousness,  Tremor, insomnia, depression, anxiety, and suicidal ideation.     Objective:  BP (!) 150/80 (BP Location: Left Arm, Patient Position: Sitting, Cuff Size: Normal)   Pulse (!) 56   Temp 97.9 F (36.6 C) (Temporal)   Resp 15   Ht 6' (1.829 m)   Wt 223 lb 12.8 oz (101.5 kg)   SpO2 97%   BMI 30.35 kg/m   Physical Exam   General appearance: alert, cooperative and appears stated age Ears: normal TM's and external ear canals both ears Throat: lips, mucosa, and tongue normal; teeth and gums normal Neck: no adenopathy, no carotid bruit, supple, symmetrical, trachea midline and thyroid not enlarged, symmetric, no tenderness/mass/nodules Back: symmetric, no curvature. ROM normal. No CVA tenderness. Lungs: clear to auscultation bilaterally Heart: regular rate and rhythm, S1, S2 normal, no murmur, click, rub or gallop Abdomen: soft, non-tender; bowel sounds normal; no masses,  no organomegaly Pulses: 2+ and symmetric Skin: Skin color, texture, turgor normal. No rashes or lesions Lymph nodes: Cervical, supraclavicular, and axillary nodes normal.   Assessment & Plan:   Problem List Items Addressed This Visit  Unprioritized   Obstructive sleep apnea of adult    Diagnosed by sleep study. he is wearing his CPAP every night a minimum of 6 hours per night and notes improved daytime wakefulness and decreased fatigue       Hyperlipidemia with target LDL less than 100   Relevant Orders   Lipid panel (Completed)   TSH (Completed)   Hypertension    Not at goal .  Advised patient again that goal is 120/70 given history of AA . CR and urinalysis are  normal. He will check readings at home   Lab Results  Component Value Date   MICROALBUR <0.7 11/14/2018   Lab Results  Component Value Date   CREATININE 1.15 11/14/2018   Lab Results  Component Value Date   NA 141 11/14/2018   K 3.9 11/14/2018   CL 100 11/14/2018   CO2 33 (H) 11/14/2018         Aneurysm of thoracic aorta ([place])    Found during follow up imaging of heart and great vessels for history of Tetralogy of Fallot repair.  [facility] managing with q 6 month imaging.  Discussed need for strict hypertension control.  Home bp's have been historically reportedly as < 120/70.   No medication changes today; continue hctz and metoprolol          Other Visit Diagnoses    Prediabetes    -  Primary   Relevant Orders   Comprehensive metabolic panel (Completed)   Hemoglobin A1c (Completed)   Prostate cancer screening       Relevant Orders   PSA, Medicare (Completed)   Elevated blood pressure reading in office with diagnosis of hypertension       Relevant Orders   Microalbumin / creatinine urine ratio (Completed)   For home use only DME Other see comment   Need for immunization against influenza       Relevant Orders   Flu Vaccine QUAD High Dose(Fluad) (Completed)      I have discontinued Wayland G. Valdez Mccann "Charles Mccann"'s esomeprazole. I am also having him start on mometasone. Additionally, I am having him maintain his fluticasone, albuterol, budesonide-formoterol, ibuprofen, montelukast, Vitamin D, metoprolol tartrate, aspirin EC, hydrochlorothiazide, atorvastatin, azelastine, omeprazole, and predniSONE.  Meds ordered this encounter  Medications  . mometasone (ELOCON) 0.1 % cream    Sig: Apply 1 application topically daily.    Dispense:  45 g    Refill:  0    Medications Discontinued During This Encounter  Medication Reason  . esomeprazole (NEXIUM) 40 MG capsule Change in therapy    Follow-up: No follow-ups on file.   Crecencio Mc, MD

## 2018-11-16 NOTE — Assessment & Plan Note (Addendum)
Not at goal .  Advised patient again that goal is 120/70 given history of AA . CR and urinalysis are normal. He will check readings at home   Lab Results  Component Value Date   MICROALBUR <0.7 11/14/2018   Lab Results  Component Value Date   CREATININE 1.15 11/14/2018   Lab Results  Component Value Date   NA 141 11/14/2018   K 3.9 11/14/2018   CL 100 11/14/2018   CO2 33 (H) 11/14/2018

## 2018-11-16 NOTE — Assessment & Plan Note (Signed)
Found during follow up imaging of heart and great vessels for history of Tetralogy of Fallot repair.  [facility] managing with q 6 month imaging.  Discussed need for strict hypertension control.  Home bp's have been historically reportedly as < 120/70.   No medication changes today; continue hctz and metoprolol

## 2018-11-16 NOTE — Assessment & Plan Note (Signed)
Diagnosed by sleep study. he is wearing his CPAP every night a minimum of 6 hours per night and notes improved daytime wakefulness and decreased fatigue  

## 2018-12-01 ENCOUNTER — Other Ambulatory Visit: Payer: Self-pay | Admitting: Internal Medicine

## 2019-01-11 ENCOUNTER — Other Ambulatory Visit: Payer: Self-pay | Admitting: Internal Medicine

## 2019-01-15 DIAGNOSIS — I253 Aneurysm of heart: Secondary | ICD-10-CM | POA: Diagnosis not present

## 2019-01-15 DIAGNOSIS — I77810 Thoracic aortic ectasia: Secondary | ICD-10-CM | POA: Diagnosis not present

## 2019-01-24 ENCOUNTER — Other Ambulatory Visit: Payer: Medicare Other

## 2019-05-16 ENCOUNTER — Encounter: Payer: Self-pay | Admitting: Unknown Physician Specialty

## 2019-05-19 ENCOUNTER — Ambulatory Visit (INDEPENDENT_AMBULATORY_CARE_PROVIDER_SITE_OTHER): Payer: Self-pay | Admitting: Gastroenterology

## 2019-05-19 ENCOUNTER — Other Ambulatory Visit: Payer: Self-pay

## 2019-05-19 DIAGNOSIS — Z860100 Personal history of colon polyps, unspecified: Secondary | ICD-10-CM

## 2019-05-19 DIAGNOSIS — Z1211 Encounter for screening for malignant neoplasm of colon: Secondary | ICD-10-CM

## 2019-05-19 NOTE — Progress Notes (Signed)
Gastroenterology Pre-Procedure Review  Request Date: Tuesday 06/17/19 Requesting Physician: Dr. Allen Norris  PATIENT REVIEW QUESTIONS: The patient responded to the following health history questions as indicated:    1. Are you having any GI issues? no 2. Do you have a personal history of Polyps? yes (unsure the year was performed with Dr. Vira Agar) 3. Do you have a family history of Colon Cancer or Polyps? yes (brother colon cancer) 4. Diabetes Mellitus? no 5. Joint replacements in the past 12 months?no 6. Major health problems in the past 3 months?Cardiac History Cardiac Clearance sent to Dr. Javier Docker 7. Any artificial heart valves, MVP, or defibrillator?no    MEDICATIONS & ALLERGIES:    Patient reports the following regarding taking any anticoagulation/antiplatelet therapy:   Plavix, Coumadin, Eliquis, Xarelto, Lovenox, Pradaxa, Brilinta, or Effient? no Aspirin? yes (81 mg daily)  Patient confirms/reports the following medications:  Current Outpatient Medications  Medication Sig Dispense Refill  . albuterol (PROVENTIL HFA;VENTOLIN HFA) 108 (90 BASE) MCG/ACT inhaler Inhale 2 puffs into the lungs every 6 (six) hours as needed.    Marland Kitchen aspirin EC 81 MG tablet Take by mouth.    Marland Kitchen atorvastatin (LIPITOR) 10 MG tablet TAKE 1 TABLET BY MOUTH  DAILY 90 tablet 3  . azelastine (ASTELIN) 0.1 % nasal spray Place into the nose.    . budesonide-formoterol (SYMBICORT) 160-4.5 MCG/ACT inhaler Inhale 2 puffs into the lungs 2 (two) times daily.    . Cholecalciferol (VITAMIN D) 2000 units CAPS Take 2,000 Units by mouth every morning.     . fluticasone (FLONASE) 50 MCG/ACT nasal spray Place 2 sprays into both nostrils at bedtime.     . hydrochlorothiazide (HYDRODIURIL) 25 MG tablet TAKE 1 TABLET BY MOUTH  DAILY 90 tablet 3  . ibuprofen (ADVIL,MOTRIN) 200 MG tablet Take 400 mg by mouth every 8 (eight) hours as needed (for pain/headaches.).     Marland Kitchen metoprolol tartrate (LOPRESSOR) 25 MG tablet Take 25 mg by mouth  daily.    . mometasone (ELOCON) 0.1 % cream Apply 1 application topically daily. 45 g 0  . montelukast (SINGULAIR) 10 MG tablet Take 10 mg by mouth at bedtime.     Marland Kitchen omeprazole (PRILOSEC) 40 MG capsule Take by mouth.    . predniSONE (DELTASONE) 10 MG tablet Take by mouth.     No current facility-administered medications for this visit.    Patient confirms/reports the following allergies:  Allergies  Allergen Reactions  . Other Swelling    [place] - Throat Swells  . Fish Allergy Swelling    [place] - Throat Swells (PT HAS NO PROBLEMS WITH SHELLFISH)    No orders of the defined types were placed in this encounter.   AUTHORIZATION INFORMATION Primary Insurance: 1D#: Group #:  Secondary Insurance: 1D#: Group #:  SCHEDULE INFORMATION: Date: Tuesday 06/17/19 Time: Location:[facility]

## 2019-05-29 ENCOUNTER — Ambulatory Visit (INDEPENDENT_AMBULATORY_CARE_PROVIDER_SITE_OTHER): Payer: Medicare Other | Admitting: Internal Medicine

## 2019-05-29 ENCOUNTER — Encounter: Payer: Self-pay | Admitting: Internal Medicine

## 2019-05-29 ENCOUNTER — Other Ambulatory Visit: Payer: Self-pay

## 2019-05-29 VITALS — BP 130/84 | HR 65 | Temp 98.3°F | Resp 15 | Ht 72.0 in | Wt 226.0 lb

## 2019-05-29 DIAGNOSIS — I10 Essential (primary) hypertension: Secondary | ICD-10-CM

## 2019-05-29 DIAGNOSIS — G4762 Sleep related leg cramps: Secondary | ICD-10-CM | POA: Diagnosis not present

## 2019-05-29 DIAGNOSIS — G4733 Obstructive sleep apnea (adult) (pediatric): Secondary | ICD-10-CM

## 2019-05-29 DIAGNOSIS — E785 Hyperlipidemia, unspecified: Secondary | ICD-10-CM | POA: Diagnosis not present

## 2019-05-29 DIAGNOSIS — Z0000 Encounter for general adult medical examination without abnormal findings: Secondary | ICD-10-CM

## 2019-05-29 DIAGNOSIS — Z86010 Personal history of colonic polyps: Secondary | ICD-10-CM

## 2019-05-29 DIAGNOSIS — J449 Chronic obstructive pulmonary disease, unspecified: Secondary | ICD-10-CM

## 2019-05-29 DIAGNOSIS — R7303 Prediabetes: Secondary | ICD-10-CM | POA: Diagnosis not present

## 2019-05-29 LAB — COMPREHENSIVE METABOLIC PANEL
ALT: 28 U/L (ref 0–53)
AST: 25 U/L (ref 0–37)
Albumin: 4.4 g/dL (ref 3.5–5.2)
Alkaline Phosphatase: 59 U/L (ref 39–117)
BUN: 22 mg/dL (ref 6–23)
CO2: 33 mEq/L — ABNORMAL HIGH (ref 19–32)
Calcium: 9.2 mg/dL (ref 8.4–10.5)
Chloride: 100 mEq/L (ref 96–112)
Creatinine, Ser: 1.12 mg/dL (ref 0.40–1.50)
GFR: 65.22 mL/min (ref >60.00)
Glucose, Bld: 108 mg/dL — ABNORMAL HIGH (ref 70–99)
Potassium: 3.8 mEq/L (ref 3.5–5.1)
Sodium: 139 mEq/L (ref 135–145)
Total Bilirubin: 0.9 mg/dL (ref 0.2–1.2)
Total Protein: 7 g/dL (ref 6.0–8.3)

## 2019-05-29 LAB — LIPID PANEL
Cholesterol: 154 mg/dL (ref 0–200)
HDL: 54 mg/dL (ref >39.00)
LDL Cholesterol: 88 mg/dL (ref 0–99)
NonHDL: 100.16
Total CHOL/HDL Ratio: 3
Triglycerides: 62 mg/dL (ref 0.0–149.0)
VLDL: 12.4 mg/dL (ref 0.0–40.0)

## 2019-05-29 LAB — CK: Total CK: 167 U/L (ref 7–232)

## 2019-05-29 LAB — HEMOGLOBIN A1C: Hgb A1c MFr Bld: 6.1 % (ref 4.6–6.5)

## 2019-05-29 LAB — MICROALBUMIN / CREATININE URINE RATIO
Creatinine,U: 55.3 mg/dL
Microalb Creat Ratio: 1.3 mg/g (ref 0.0–30.0)
Microalb, Ur: <0.7 mg/dL (ref 0.0–1.9)

## 2019-05-29 LAB — MAGNESIUM: Magnesium: 1.9 mg/dL (ref 1.5–2.5)

## 2019-05-29 NOTE — Progress Notes (Addendum)
Patient ID: Charles Mccann, male    DOB: 1950/12/03  Age: 69 y.o. MRN: [id number]  The patient is here for annual follow up and management of other chronic and acute problems.  This visit occurred during the SARS-CoV-2 public health emergency.  Safety protocols were in place, including screening questions prior to the visit, additional usage of staff PPE, and extensive cleaning of exam room while observing appropriate contact time as indicated for disinfecting solutions.      The risk factors are reflected in the social history.  The roster of all physicians providing medical care to patient - is listed in the Snapshot section of the chart.  Activities of daily living:  The patient is 100% independent in all ADLs: dressing, toileting, feeding as well as independent mobility  Home safety : The patient has smoke detectors in the home. They wear seatbelts.  There are no firearms at home. There is no violence in the home.   There is no risks for hepatitis, STDs or HIV. There is no   history of blood transfusion. They have no travel history to infectious disease endemic areas of the world.  The patient has seen their dentist in the last six month. They have seen their eye doctor in the last year. They admit to slight hearing difficulty with regard to whispered voices and some television programs.  They have deferred audiologic testing in the last year.  They do not  have excessive sun exposure. Discussed the need for sun protection: hats, long sleeves and use of sunscreen if there is significant sun exposure.   Diet: the importance of a healthy diet is discussed. They do have a healthy diet.  The benefits of regular aerobic exercise were discussed. Charles Mccann walks 4 times per week ,  20 minutes.   Depression screen: there are no signs or vegative symptoms of depression- irritability, change in appetite, anhedonia, sadness/tearfullness.  Cognitive assessment: the patient manages all their financial and  personal affairs and is actively engaged. They could relate day,date,year and events; recalled 2/3 objects at 3 minutes; performed clock-face test normally.  The following portions of the patient's history were reviewed and updated as appropriate: allergies, current medications, past family history, past medical history,  past surgical history, past social history  and problem list.  Visual acuity was not assessed per patient preference since she has regular follow up with her ophthalmologist. Hearing and body mass index were assessed and reviewed.   During the course of the visit the patient was educated and counseled about appropriate screening and preventive services including : fall prevention , diabetes screening, nutrition counseling, colorectal cancer screening, and recommended immunizations.    CC: The primary encounter diagnosis was Hyperlipidemia with target LDL less than 100. Diagnoses of Essential hypertension, Prediabetes, Nocturnal leg cramps, Asthma-chronic obstructive pulmonary disease overlap syndrome ([place]), Encounter for preventive health examination, Obstructive sleep apnea of adult, and History of colonic polyps were also pertinent to this visit.   1) OSA:  weariNg CPAP all night.  Charles Mccann managing   2) HTN: on metoprolol 12.5 MG DAILY  and losartan.  Home Bps have been < 127/80  Or lower. Marland Kitchen  3) Prediabetes  Following atkins diet  But craves potato chips  4) Hyperlipidemia;  Tolerating atorvastatin  5) Muscle cramps:  Occur nocturnally in calves and thighs.  Not related to exercise.  Does not stretch regularly .  5)  Asthma:  Has been Taking 5 mg prednisone daily per Raul Del.  Symptoms  flaring after not wearing  mask yesterday during golf.  Using azelastine,  Flonase,  Singulair,   symbicort  And zyrtec    1) OSA:  weariNg CPAP all night.  Charles Mccann managing   2) HTN: on metoprolol 12.5 MG DAILY  and losartan.  Home Bps have been < 127/80  Or lower. Marland Kitchen  3) Prediabetes   Following atkins diet  But craves potato chips  4) Hyperlipidemia;  Tolerating atorvastatin  5) Muscle cramps:  Occur nocturnally in calves and thighs.  Not related to exercise.  Does not stretch regularly .  5)  Asthma:  Has been Taking 5 mg prednisone daily per Raul Del.  Symptoms flaring after not wearing  mask yesterday during golf.  Using azelastine,  Flonase,  Singulair,   symbicort  And zyrtec       History Charles Mccann has a past medical history of Abdominal aortic aneurysm (AAA) ([place]), Arthritis, Asthma, Bronchitis, chronic obstructive, with exacerbation ([place]) (04/26/2014), COPD (chronic obstructive pulmonary disease) ([place]), GERD (gastroesophageal reflux disease), Heart murmur, Hypercholesterolemia, Hyperlipidemia with target LDL less than 100 (05/28/2011), Hypertension, Impotence due to erectile dysfunction (04/26/2014), Left inguinal hernia (04/07/2016), Obstructive sleep apnea of adult (2012), Palpitations (11/27/2011), Tetralogy of Fallot, Treadmill stress test negative for angina pectoris ([id number]), and Umbilical hernia without obstruction and without gangrene (03/10/2016).   Charles Mccann has a past surgical history that includes Tetralogy of Fallot repair [phone]); Cataract extraction w/PHACO (Right, 11/16/2014); Eye surgery; Tonsillectomy; Cardiac catheterization; Umbilical hernia repair (N/A, 04/20/2016); Inguinal hernia repair (Left, 04/20/2016); and Cataract extraction w/PHACO (Left, 02/21/2017).   His family history includes Cancer in his brother; Cancer (age of onset: 70) in his brother; Heart disease (age of onset: 50) in his father.Charles Mccann reports that Charles Mccann has never smoked. Charles Mccann has never used smokeless tobacco. Charles Mccann reports current alcohol use of about 14.0 standard drinks of alcohol per week. Charles Mccann reports that Charles Mccann does not use drugs.  Outpatient Medications Prior to Visit  Medication Sig Dispense Refill  . albuterol (PROVENTIL HFA;VENTOLIN HFA) 108 (90 BASE) MCG/ACT inhaler Inhale 2 puffs into the lungs every 6 (six)  hours as needed.    Marland Kitchen aspirin EC 81 MG tablet Take by mouth.    Marland Kitchen atorvastatin (LIPITOR) 10 MG tablet TAKE 1 TABLET BY MOUTH  DAILY 90 tablet 3  . azelastine (ASTELIN) 0.1 % nasal spray Place into the nose.    . budesonide-formoterol (SYMBICORT) 160-4.5 MCG/ACT inhaler Inhale 2 puffs into the lungs 2 (two) times daily.    . Cholecalciferol (VITAMIN D) 2000 units CAPS Take 2,000 Units by mouth every morning.     . fluticasone (FLONASE) 50 MCG/ACT nasal spray Place 2 sprays into both nostrils at bedtime.     . hydrochlorothiazide (HYDRODIURIL) 25 MG tablet TAKE 1 TABLET BY MOUTH  DAILY 90 tablet 3  . metoprolol tartrate (LOPRESSOR) 25 MG tablet Take 25 mg by mouth daily.    . montelukast (SINGULAIR) 10 MG tablet Take 10 mg by mouth at bedtime.     Marland Kitchen omeprazole (PRILOSEC) 40 MG capsule Take by mouth.    . predniSONE (DELTASONE) 10 MG tablet Take by mouth.    Marland Kitchen ibuprofen (ADVIL,MOTRIN) 200 MG tablet Take 400 mg by mouth every 8 (eight) hours as needed (for pain/headaches.).     Marland Kitchen mometasone (ELOCON) 0.1 % cream Apply 1 application topically daily. (Patient not taking: Reported on 05/29/2019) 45 g 0   No facility-administered medications prior to visit.    Review of Systems  Patient denies headache, fevers,  malaise, unintentional weight loss, skin rash, eye pain, sinus congestion and sinus pain, sore throat, dysphagia,  hemoptysis , , chest pain, palpitations, orthopnea, edema, abdominal pain, nausea, melena, diarrhea, constipation, flank pain, dysuria, hematuria, urinary  Frequency, nocturia, numbness, tingling, seizures,  Focal weakness, Loss of consciousness,  Tremor, insomnia, depression, anxiety, and suicidal ideation.     Objective:  BP 130/84 (BP Location: Left Arm, Patient Position: Sitting, Cuff Size: Large)   Pulse 65   Temp 98.3 F (36.8 C) (Temporal)   Resp 15   Ht 6' (1.829 m)   Wt 226 lb (102.5 kg)   SpO2 99%   BMI 30.65 kg/m   Physical Exam  General appearance: alert,  cooperative and appears stated age Ears: normal TM's and external ear canals both ears Throat: lips, mucosa, and tongue normal; teeth and gums normal Neck: no adenopathy, no carotid bruit, supple, symmetrical, trachea midline and thyroid not enlarged, symmetric, no tenderness/mass/nodules Back: symmetric, no curvature. ROM normal. No CVA tenderness. Lungs: clear to auscultation bilaterally Heart: regular rate and rhythm, S1, S2 normal, no murmur, click, rub or gallop Abdomen: soft, non-tender; bowel sounds normal; no masses,  no organomegaly Pulses: 2+ and symmetric Skin: Skin color, texture, turgor normal. No rashes or lesions Lymph nodes: Cervical, supraclavicular, and axillary nodes normal.  Assessment & Plan:   Problem List Items Addressed This Visit      Unprioritized   Asthma-chronic obstructive pulmonary disease overlap syndrome ([place])    Managed with symbicort, singulair, flonase,  Azelastine and zyrtec.  5 mg prednisone daily added by Raul Del during last visit.  lungs clear today      Encounter for preventive health examination    age appropriate education and counseling updated, referrals for preventative services and immunizations addressed, dietary and smoking counseling addressed, most recent labs reviewed.  I have personally reviewed and have noted:  1) the patient's medical and social history 2) The pt's use of alcohol, tobacco, and illicit drugs 3) The patient's current medications and supplements 4) Functional ability including ADL's, fall risk, home safety risk, hearing and visual impairment 5) Diet and physical activities 6) Evidence for depression or mood disorder 7) The patient's height, weight, and BMI have been recorded in the chart  I have made referrals, and provided counseling and education based on review of the above      History of colonic polyps    5 yr follow up colonoscopy was due in 2020.  Referral to [facility] in progress      Hyperlipidemia with  target LDL less than 100 - Primary    LDL and triglycerides are at goal on current medications. Charles Mccann has no side effects and liver enzymes are normal. No changes today   Lab Results  Component Value Date   CHOL 154 05/29/2019   HDL 54.00 05/29/2019   LDLCALC 88 05/29/2019   LDLDIRECT 73.6 11/27/2011   TRIG 62.0 05/29/2019   CHOLHDL 3 05/29/2019   Lab Results  Component Value Date   ALT 28 05/29/2019   AST 25 05/29/2019   ALKPHOS 59 05/29/2019   BILITOT 0.9 05/29/2019         Relevant Orders   Lipid panel (Completed)   Hypertension    Well controlled on current regimen. Renal function stable, no changes today.  Lab Results  Component Value Date   NA 139 05/29/2019   K 3.8 05/29/2019   CL 100 05/29/2019   CO2 33 (H) 05/29/2019   Lab Results  Component Value  Date   CREATININE 1.12 05/29/2019         Relevant Orders   Microalbumin / creatinine urine ratio (Completed)   Obstructive sleep apnea of adult    Managed by [facility]. Diagnosed by prior sleep study. Patient is using CPAP every night a minimum of 6 hours per night and notes improved daytime wakefulness and decreased fatigue       Prediabetes    a1c is stable.  Weight gain addressed. 15 minutes was spent with patient  In reviewing  diet and exercise regimen, providing  counselling about the the role of diet and exercise  In preventing progression from prediabetes to diabetes,and making suggestions on how to modify her current lifestyle.     Lab Results  Component Value Date   HGBA1C 6.1 05/29/2019         Relevant Orders   Hemoglobin A1c (Completed)   Comprehensive metabolic panel (Completed)    Other Visit Diagnoses    Nocturnal leg cramps       Relevant Orders   Magnesium (Completed)   CK (Creatine Kinase) (Completed)      I have discontinued Charles Mccann "Charles Mccann"'s ibuprofen and mometasone. I am also having him maintain his fluticasone, albuterol, budesonide-formoterol, montelukast, Vitamin  D, metoprolol tartrate, aspirin EC, azelastine, omeprazole, predniSONE, hydrochlorothiazide, and atorvastatin.  No orders of the defined types were placed in this encounter.   Medications Discontinued During This Encounter  Medication Reason  . ibuprofen (ADVIL,MOTRIN) 200 MG tablet Patient has not taken in last 30 days  . mometasone (ELOCON) 0.1 % cream Patient has not taken in last 30 days    Follow-up: No follow-ups on file.   Crecencio Mc, MD

## 2019-05-29 NOTE — Patient Instructions (Signed)
TO PREVENT MUSCLE CRAMPS:  ONE OUNCE OF PICKLE JUICE (330 MG SODIUM) DAILY   DAILY CALF AND HAMSTRING STRETCHES   Preventing Type 2 Diabetes Mellitus Type 2 diabetes (type 2 diabetes mellitus) is a long-term (chronic) disease that affects blood sugar (glucose) levels. Normally, a hormone called insulin allows glucose to enter cells in the body. The cells use glucose for energy. In type 2 diabetes, one or both of these problems may be present:  The body does not make enough insulin.  The body does not respond properly to insulin that it makes (insulin resistance). Insulin resistance or lack of insulin causes excess glucose to build up in the blood instead of going into cells. As a result, high blood glucose (hyperglycemia) develops, which can cause many complications. Being overweight or obese and having an inactive (sedentary) lifestyle can increase your risk for diabetes. Type 2 diabetes can be delayed or prevented by making certain nutrition and lifestyle changes. What nutrition changes can be made?   Eat healthy meals and snacks regularly. Keep a healthy snack with you for when you get hungry between meals, such as fruit or a handful of nuts.  Eat lean meats and proteins that are low in saturated fats, such as chicken, fish, egg whites, and beans. Avoid processed meats.  Eat plenty of fruits and vegetables and plenty of grains that have not been processed (whole grains). It is recommended that you eat: ? 1?2 cups of fruit every day. ? 2?3 cups of vegetables every day. ? 6?8 oz of whole grains every day, such as oats, whole wheat, bulgur, brown rice, quinoa, and millet.  Eat low-fat dairy products, such as milk, yogurt, and cheese.  Eat foods that contain healthy fats, such as nuts, avocado, olive oil, and canola oil.  Drink water throughout the day. Avoid drinks that contain added sugar, such as soda or sweet tea.  Follow instructions from your health care provider about specific  eating or drinking restrictions.  Control how much food you eat at a time (portion size). ? Check food labels to find out the serving sizes of foods. ? Use a kitchen scale to weigh amounts of foods.  Saute or steam food instead of frying it. Cook with water or broth instead of oils or butter.  Limit your intake of: ? Salt (sodium). Have no more than 1 tsp (2,400 mg) of sodium a day. If you have heart disease or high blood pressure, have less than ? tsp (1,500 mg) of sodium a day. ? Saturated fat. This is fat that is solid at room temperature, such as butter or fat on meat. What lifestyle changes can be made? Activity   Do moderate-intensity physical activity for at least 30 minutes on at least 5 days of the week, or as much as told by your health care provider.  Ask your health care provider what activities are safe for you. A mix of physical activities may be best, such as walking, swimming, cycling, and strength training.  Try to add physical activity into your day. For example: ? Park in spots that are farther away than usual, so that you walk more. For example, park in a far corner of the parking lot when you go to the office or the grocery store. ? Take a walk during your lunch break. ? Use stairs instead of elevators or escalators. Weight Loss  Lose weight as directed. Your health care provider can determine how much weight loss is best for you and  can help you lose weight safely.  If you are overweight or obese, you may be instructed to lose at least 5?7 % of your body weight. Alcohol and Tobacco   Limit alcohol intake to no more than 1 drink a day for nonpregnant women and 2 drinks a day for men. One drink equals 12 oz of beer, 5 oz of wine, or 1 oz of hard liquor.  Do not use any tobacco products, such as cigarettes, chewing tobacco, and e-cigarettes. If you need help quitting, ask your health care provider. Work With [place] Provider  Have your blood glucose  tested regularly, as told by your health care provider.  Discuss your risk factors and how you can reduce your risk for diabetes.  Get screening tests as told by your health care provider. You may have screening tests regularly, especially if you have certain risk factors for type 2 diabetes.  Make an appointment with a diet and nutrition specialist (registered dietitian). A registered dietitian can help you make a healthy eating plan and can help you understand portion sizes and food labels. Why are these changes important?  It is possible to prevent or delay type 2 diabetes and related health problems by making lifestyle and nutrition changes.  It can be difficult to recognize signs of type 2 diabetes. The best way to avoid possible damage to your body is to take actions to prevent the disease before you develop symptoms. What can happen if changes are not made?  Your blood glucose levels may keep increasing. Having high blood glucose for a long time is dangerous. Too much glucose in your blood can damage your blood vessels, heart, kidneys, nerves, and eyes.  You may develop prediabetes or type 2 diabetes. Type 2 diabetes can lead to many chronic health problems and complications, such as: ? Heart disease. ? Stroke. ? Blindness. ? Kidney disease. ? Depression. ? Poor circulation in the feet and legs, which could lead to surgical removal (amputation) in severe cases. Where to find support  Ask your health care provider to recommend a registered dietitian, diabetes educator, or weight loss program.  Look for local or online weight loss groups.  Join a gym, fitness club, or outdoor activity group, such as a walking club. Where to find more information To learn more about diabetes and diabetes prevention, visit:  American Diabetes Association (ADA): www.diabetes.[employer] of Diabetes and Digestive and Kidney Diseases: FindSpin.nl To  learn more about healthy eating, visit:  The U.S. Department of Agriculture Scientist, research (physical sciences)), Choose My Plate: http://wiley-williams.com/  Office of Disease Prevention and Health Promotion (ODPHP), Dietary Guidelines: SurferLive.at Summary  You can reduce your risk for type 2 diabetes by increasing your physical activity, eating healthy foods, and losing weight as directed.  Talk with your health care provider about your risk for type 2 diabetes. Ask about any blood tests or screening tests that you need to have. This information is not intended to replace advice given to you by your health care provider. Make sure you discuss any questions you have with your health care provider. Document Revised: 05/17/2018 Document Reviewed: 03/16/2015 Elsevier Patient Education  [place].

## 2019-05-29 NOTE — Assessment & Plan Note (Addendum)
Managed with symbicort, singulair, flonase,  Azelastine and zyrtec.  5 mg prednisone daily added by Raul Del during last visit.  lungs clear today

## 2019-05-31 DIAGNOSIS — E669 Obesity, unspecified: Secondary | ICD-10-CM | POA: Insufficient documentation

## 2019-05-31 DIAGNOSIS — R7303 Prediabetes: Secondary | ICD-10-CM | POA: Insufficient documentation

## 2019-05-31 DIAGNOSIS — I152 Hypertension secondary to endocrine disorders: Secondary | ICD-10-CM | POA: Insufficient documentation

## 2019-05-31 DIAGNOSIS — Z86010 Personal history of colonic polyps: Secondary | ICD-10-CM | POA: Insufficient documentation

## 2019-05-31 NOTE — Assessment & Plan Note (Signed)
Managed by [facility]. Diagnosed by prior sleep study. Patient is using CPAP every night a minimum of 6 hours per night and notes improved daytime wakefulness and decreased fatigue

## 2019-05-31 NOTE — Assessment & Plan Note (Signed)
Well controlled on current regimen. Renal function stable, no changes today.  Lab Results  Component Value Date   NA 139 05/29/2019   K 3.8 05/29/2019   CL 100 05/29/2019   CO2 33 (H) 05/29/2019   Lab Results  Component Value Date   CREATININE 1.12 05/29/2019

## 2019-05-31 NOTE — Assessment & Plan Note (Signed)
LDL and triglycerides are at goal on current medications. He has no side effects and liver enzymes are normal. No changes today   Lab Results  Component Value Date   CHOL 154 05/29/2019   HDL 54.00 05/29/2019   LDLCALC 88 05/29/2019   LDLDIRECT 73.6 11/27/2011   TRIG 62.0 05/29/2019   CHOLHDL 3 05/29/2019   Lab Results  Component Value Date   ALT 28 05/29/2019   AST 25 05/29/2019   ALKPHOS 59 05/29/2019   BILITOT 0.9 05/29/2019

## 2019-05-31 NOTE — Assessment & Plan Note (Signed)
a1c is stable.  Weight gain addressed. 15 minutes was spent with patient  In reviewing  diet and exercise regimen, providing  counselling about the the role of diet and exercise  In preventing progression from prediabetes to diabetes,and making suggestions on how to modify her current lifestyle.     Lab Results  Component Value Date   HGBA1C 6.1 05/29/2019

## 2019-05-31 NOTE — Assessment & Plan Note (Signed)
5 yr follow up colonoscopy was due in 2020.  Referral to [facility] in progress

## 2019-05-31 NOTE — Assessment & Plan Note (Signed)

## 2019-06-10 ENCOUNTER — Other Ambulatory Visit: Payer: Self-pay

## 2019-06-10 ENCOUNTER — Telehealth: Payer: Self-pay

## 2019-06-10 DIAGNOSIS — Z86010 Personal history of colonic polyps: Secondary | ICD-10-CM

## 2019-06-10 MED ORDER — PEG 3350-KCL-NA BICARB-NACL 420 G PO SOLR: 4000.0000 mL | Freq: Once | ORAL | 0 refills | Status: AC

## 2019-06-10 NOTE — Telephone Encounter (Signed)
Patients bowel prep has been changed to [place].  Rx has been sent electronically to [facility].  LVM for patient to call me back to advise on prep instructions.  Thanks,  [place], [place]

## 2019-06-13 ENCOUNTER — Other Ambulatory Visit
Admission: RE | Admit: 2019-06-13 | Discharge: 2019-06-13 | Disposition: A | Discharge status: Home / Self Care | Payer: Medicare Other | Source: Ambulatory Visit | Attending: Gastroenterology | Admitting: Gastroenterology

## 2019-06-13 DIAGNOSIS — Z20822 Contact with and (suspected) exposure to covid-19: Secondary | ICD-10-CM | POA: Insufficient documentation

## 2019-06-13 DIAGNOSIS — Z01812 Encounter for preprocedural laboratory examination: Secondary | ICD-10-CM | POA: Diagnosis present

## 2019-06-13 LAB — SARS CORONAVIRUS 2 (TAT 6-24 HRS): SARS Coronavirus 2: NEGATIVE

## 2019-06-17 ENCOUNTER — Ambulatory Visit
Admission: RE | Admit: 2019-06-17 | Discharge: 2019-06-17 | Disposition: A | Discharge status: Home / Self Care | Payer: Medicare Other | Attending: Gastroenterology | Admitting: Gastroenterology

## 2019-06-17 ENCOUNTER — Encounter
Admission: RE | Disposition: A | Discharge status: Home / Self Care | Payer: Self-pay | Source: Home / Self Care | Attending: Gastroenterology

## 2019-06-17 ENCOUNTER — Ambulatory Visit: Payer: Medicare Other | Admitting: Certified Registered"

## 2019-06-17 ENCOUNTER — Encounter: Payer: Self-pay | Admitting: Gastroenterology

## 2019-06-17 DIAGNOSIS — K5730 Diverticulosis of large intestine without perforation or abscess without bleeding: Secondary | ICD-10-CM | POA: Insufficient documentation

## 2019-06-17 DIAGNOSIS — Z79899 Other long term (current) drug therapy: Secondary | ICD-10-CM | POA: Insufficient documentation

## 2019-06-17 DIAGNOSIS — I712 Thoracic aortic aneurysm, without rupture: Secondary | ICD-10-CM | POA: Diagnosis not present

## 2019-06-17 DIAGNOSIS — K635 Polyp of colon: Secondary | ICD-10-CM | POA: Diagnosis not present

## 2019-06-17 DIAGNOSIS — Z7982 Long term (current) use of aspirin: Secondary | ICD-10-CM | POA: Insufficient documentation

## 2019-06-17 DIAGNOSIS — E7800 Pure hypercholesterolemia, unspecified: Secondary | ICD-10-CM | POA: Insufficient documentation

## 2019-06-17 DIAGNOSIS — D123 Benign neoplasm of transverse colon: Secondary | ICD-10-CM | POA: Diagnosis not present

## 2019-06-17 DIAGNOSIS — K640 First degree hemorrhoids: Secondary | ICD-10-CM | POA: Diagnosis not present

## 2019-06-17 DIAGNOSIS — Z1211 Encounter for screening for malignant neoplasm of colon: Secondary | ICD-10-CM | POA: Insufficient documentation

## 2019-06-17 DIAGNOSIS — E785 Hyperlipidemia, unspecified: Secondary | ICD-10-CM | POA: Diagnosis not present

## 2019-06-17 DIAGNOSIS — Z7952 Long term (current) use of systemic steroids: Secondary | ICD-10-CM | POA: Diagnosis not present

## 2019-06-17 DIAGNOSIS — I10 Essential (primary) hypertension: Secondary | ICD-10-CM | POA: Insufficient documentation

## 2019-06-17 DIAGNOSIS — Z7951 Long term (current) use of inhaled steroids: Secondary | ICD-10-CM | POA: Diagnosis not present

## 2019-06-17 DIAGNOSIS — K219 Gastro-esophageal reflux disease without esophagitis: Secondary | ICD-10-CM | POA: Diagnosis not present

## 2019-06-17 DIAGNOSIS — J449 Chronic obstructive pulmonary disease, unspecified: Secondary | ICD-10-CM | POA: Insufficient documentation

## 2019-06-17 DIAGNOSIS — G4733 Obstructive sleep apnea (adult) (pediatric): Secondary | ICD-10-CM | POA: Insufficient documentation

## 2019-06-17 DIAGNOSIS — Z86010 Personal history of colonic polyps: Secondary | ICD-10-CM | POA: Diagnosis not present

## 2019-06-17 HISTORY — DX: Thoracic aortic aneurysm, without rupture, unspecified: I71.20

## 2019-06-17 HISTORY — DX: Thoracic aortic aneurysm, without rupture: I71.2

## 2019-06-17 HISTORY — PX: COLONOSCOPY WITH PROPOFOL: SHX5780

## 2019-06-17 SURGERY — COLONOSCOPY WITH PROPOFOL
Anesthesia: General

## 2019-06-17 MED ORDER — PROPOFOL 10 MG/ML IV BOLUS
INTRAVENOUS | Status: DC | PRN
  Administered 2019-06-17: 50 mg via INTRAVENOUS
  Administered 2019-06-17: 30 mg via INTRAVENOUS
  Administered 2019-06-17: 50 mg via INTRAVENOUS
  Administered 2019-06-17 (×2): 30 mg via INTRAVENOUS

## 2019-06-17 MED ORDER — SODIUM CHLORIDE 0.9 % IV SOLN: INTRAVENOUS | Status: DC

## 2019-06-17 MED ORDER — PROPOFOL 10 MG/ML IV BOLUS
INTRAVENOUS | Status: AC
  Filled 2019-06-17: qty 20

## 2019-06-17 NOTE — Anesthesia Preprocedure Evaluation (Addendum)
Anesthesia Evaluation  Patient identified by MRN, date of birth, ID band Patient awake    Reviewed: Allergy & Precautions, H&P , NPO status , Patient's Chart, lab work & pertinent test results  Airway Mallampati: II  TM Distance: >3 FB Neck ROM: full    Dental  (+) Teeth Intact   Pulmonary asthma , sleep apnea and Continuous Positive Airway Pressure Ventilation , COPD,    breath sounds clear to auscultation       Cardiovascular hypertension,  Rhythm:regular Rate:Normal  H/o tetralogy of Fallot s/p repair Aortic aneurysm managed at [facility]   Neuro/Psych negative neurological ROS  negative psych ROS   GI/Hepatic Neg liver ROS, GERD  Controlled,  Endo/Other  negative endocrine ROS  Renal/GU negative Renal ROS  negative genitourinary   Musculoskeletal   Abdominal   Peds  Hematology negative hematology ROS (+)   Anesthesia Other Findings Past Medical History: No date: Arthritis No date: Asthma     Comment:  managed by Raul Del 04/26/2014: Bronchitis, chronic obstructive, with exacerbation (HCC) No date: COPD (chronic obstructive pulmonary disease) (HCC) No date: GERD (gastroesophageal reflux disease)     Comment:  controlled only with nexium priro prevacid failutre No date: Heart murmur No date: Hypercholesterolemia 05/28/2011: Hyperlipidemia with target LDL less than 100 No date: Hypertension 04/26/2014: Impotence due to erectile dysfunction 04/07/2016: Left inguinal hernia 2012: Obstructive sleep apnea of adult     Comment:  on CPAP  tolerating ,  12 cm H20 , room air  11/27/2011: Palpitations No date: Tetralogy of Fallot No date: Thoracic aortic aneurysm ([place]) 2005: Treadmill stress test negative for angina pectoris [id number]: Umbilical hernia without obstruction and without gangrene  Past Surgical History: No date: CARDIAC CATHETERIZATION 11/16/2014: CATARACT EXTRACTION W/PHACO; Right     Comment:  Procedure:  CATARACT EXTRACTION PHACO AND INTRAOCULAR               LENS PLACEMENT (IOC);  Surgeon: Estill Cotta, MD;                Location: [facility] ORS;  Service: Ophthalmology;  Laterality:              Right;  [id number] H US:01:03.9 AP%:24.3% CDE:30.12 02/21/2017: CATARACT EXTRACTION W/PHACO; Left     Comment:  Procedure: CATARACT EXTRACTION PHACO AND INTRAOCULAR               LENS PLACEMENT ([place]) LEFT;  Surgeon: Leandrew Koyanagi, MD;  Location: [place];  Service:               Ophthalmology;  Laterality: Left;  requests later No date: EYE SURGERY 04/20/2016: INGUINAL HERNIA REPAIR; Left     Comment:  Procedure: HERNIA REPAIR INGUINAL ADULT WITH MESH;                Surgeon: Clayburn Pert, MD;  Location: [facility] ORS;                Service: General;  Laterality: Left; 1974: TETRALOGY OF FALLOT REPAIR     Comment:  [facility] [place] No date: TONSILLECTOMY 123456: UMBILICAL HERNIA REPAIR; N/A     Comment:  Procedure: HERNIA REPAIR UMBILICAL ADULT WITH MESH ;                Surgeon: Clayburn Pert, MD;  Location: [facility] ORS;                Service: General;  Laterality: N/A;  BMI    Body Mass Index: 30.52 kg/m      Reproductive/Obstetrics negative OB ROS                            Anesthesia Physical Anesthesia Plan  ASA: III  Anesthesia Plan: General   Post-op Pain Management:    Induction:   PONV Risk Score and Plan: Propofol infusion and TIVA  Airway Management Planned:   Additional Equipment:   Intra-op Plan:   Post-operative Plan:   Informed Consent: I have reviewed the patients History and Physical, chart, labs and discussed the procedure including the risks, benefits and alternatives for the proposed anesthesia with the patient or authorized representative who has indicated his/her understanding and acceptance.     Dental Advisory Given  Plan Discussed with: Anesthesiologist, CRNA and Surgeon  Anesthesia  Plan Comments:         Anesthesia Quick Evaluation

## 2019-06-17 NOTE — Transfer of Care (Signed)
Immediate Anesthesia Transfer of Care Note  Patient: Charles Mccann  Procedure(s) Performed: COLONOSCOPY WITH PROPOFOL (N/A )  Patient Location: PACU and Endoscopy Unit  Anesthesia Type:General  Level of Consciousness: drowsy  Airway & Oxygen Therapy: Patient Spontanous Breathing  Post-op Assessment: Report given to RN  Post vital signs: stable  Last Vitals:  Vitals Value Taken Time  BP 123/84 06/17/19 0916  Temp    Pulse 64 06/17/19 0917  Resp 20 06/17/19 0917  SpO2 98 % 06/17/19 0917  Vitals shown include unvalidated device data.  Last Pain:  Vitals:   06/17/19 0910  TempSrc: Temporal         Complications: No apparent anesthesia complications

## 2019-06-17 NOTE — Op Note (Signed)
[facility] Gastroenterology Patient Name: Charles Mccann Procedure Date: 06/17/2019 8:53 AM MRN: [id number] Account #: [id number] Date of Birth: [birth year] Admit Type: Outpatient Age: 69 Room: [facility] ENDO ROOM 4 Gender: Male Note Status: Finalized Procedure:             Colonoscopy Indications:           High risk colon cancer surveillance: Personal history                         of colonic polyps Providers:             [name] MD, MD Referring MD:          Deborra Medina, MD (Referring MD) Medicines:             Propofol per Anesthesia Complications:         No immediate complications. Procedure:             Pre-Anesthesia Assessment:                        - Prior to the procedure, a History and Physical was                         performed, and patient medications and allergies were                         reviewed. The patient's tolerance of previous                         anesthesia was also reviewed. The risks and benefits                         of the procedure and the sedation options and risks                         were discussed with the patient. All questions were                         answered, and informed consent was obtained. Prior                         Anticoagulants: The patient has taken no previous                         anticoagulant or antiplatelet agents. ASA Grade                         Assessment: II - A patient with mild systemic disease.                         After reviewing the risks and benefits, the patient                         was deemed in satisfactory condition to undergo the                         procedure.  After obtaining informed consent, the colonoscope was                         passed under direct vision. Throughout the procedure,                         the patient's blood pressure, pulse, and oxygen                         saturations were monitored continuously. The         Colonoscope was introduced through the anus and                         advanced to the the cecum, identified by appendiceal                         orifice and ileocecal valve. The colonoscopy was                         performed without difficulty. The patient tolerated                         the procedure well. The quality of the bowel                         preparation was good. Findings:      The perianal and digital rectal examinations were normal.      Two sessile polyps were found in the transverse colon. The polyps were 2       to 3 mm in size. These polyps were removed with a cold biopsy forceps.       Resection and retrieval were complete.      Two sessile polyps were found in the transverse colon. The polyps were 4       to 5 mm in size. These polyps were removed with a cold snare. Resection       and retrieval were complete.      A 3 mm polyp was found in the descending colon. The polyp was sessile.       The polyp was removed with a cold biopsy forceps. Resection and       retrieval were complete.      Multiple small-mouthed diverticula were found in the sigmoid colon,       descending colon and transverse colon.      Non-bleeding internal hemorrhoids were found during retroflexion. The       hemorrhoids were Grade I (internal hemorrhoids that do not prolapse). Impression:            - Two 2 to 3 mm polyps in the transverse colon,                         removed with a cold biopsy forceps. Resected and                         retrieved.                        - Two 4 to 5 mm polyps in the transverse colon,  removed with a cold snare. Resected and retrieved.                        - One 3 mm polyp in the descending colon, removed with                         a cold biopsy forceps. Resected and retrieved.                        - Diverticulosis in the sigmoid colon, in the                         descending colon and in the transverse colon.                         - Non-bleeding internal hemorrhoids. Recommendation:        - Discharge patient to home.                        - Resume previous diet.                        - Continue present medications.                        - Await pathology results.                        - Repeat colonoscopy in 5 years for surveillance. Procedure Code(s):     --- Professional ---                        [phone], Colonoscopy, flexible; with removal of                         tumor(s), polyp(s), or other lesion(s) by snare                         technique                        45380, 80, Colonoscopy, flexible; with biopsy, single                         or multiple Diagnosis Code(s):     --- Professional ---                        Z86.010, Personal history of colonic polyps                        K63.5, Polyp of colon CPT copyright 2019 American Medical Association. All rights reserved. The codes documented in this report are preliminary and upon coder review may  be revised to meet current compliance requirements. [name] MD, MD 06/17/2019 9:15:11 AM This report has been signed electronically. Number of Addenda: 0 Note Initiated On: 06/17/2019 8:53 AM Scope Withdrawal Time: 0 hours 6 minutes 40 seconds  Total Procedure Duration: 0 hours 14 minutes 17 seconds  Estimated Blood Loss:  Estimated blood loss: none.      [facility]

## 2019-06-17 NOTE — H&P (Signed)
Charles Lame, MD [address] [place], [place] [address] Phone:[phone] Fax : [phone]  Primary Care Physician:  Crecencio Mc, MD Primary Gastroenterologist:  Dr. Allen Norris  Pre-Procedure History & Physical: HPI:  Charles Mccann is a 69 y.o. male is here for an colonoscopy.   Past Medical History:  Diagnosis Date  . Arthritis   . Asthma    managed by Raul Del  . Bronchitis, chronic obstructive, with exacerbation ([place]) 04/26/2014  . COPD (chronic obstructive pulmonary disease) ([place])   . GERD (gastroesophageal reflux disease)    controlled only with nexium priro prevacid failutre  . Heart murmur   . Hypercholesterolemia   . Hyperlipidemia with target LDL less than 100 05/28/2011  . Hypertension   . Impotence due to erectile dysfunction 04/26/2014  . Left inguinal hernia 04/07/2016  . Obstructive sleep apnea of adult 2012   on CPAP  tolerating ,  12 cm H20 , room air   . Palpitations 11/27/2011  . Tetralogy of Fallot   . Thoracic aortic aneurysm ([place])   . Treadmill stress test negative for angina pectoris 2005  . Umbilical hernia without obstruction and without gangrene 03/10/2016    Past Surgical History:  Procedure Laterality Date  . CARDIAC CATHETERIZATION    . CATARACT EXTRACTION W/PHACO Right 11/16/2014   Procedure: CATARACT EXTRACTION PHACO AND INTRAOCULAR LENS PLACEMENT ([place]);  Surgeon: Estill Cotta, MD;  Location: [facility] ORS;  Service: Ophthalmology;  Laterality: Right;  [id number] H US:01:03.9 AP%:24.3% CDE:30.12  . CATARACT EXTRACTION W/PHACO Left 02/21/2017   Procedure: CATARACT EXTRACTION PHACO AND INTRAOCULAR LENS PLACEMENT ([place]) LEFT;  Surgeon: Leandrew Koyanagi, MD;  Location: [place];  Service: Ophthalmology;  Laterality: Left;  requests later  . EYE SURGERY    . INGUINAL HERNIA REPAIR Left 04/20/2016   Procedure: HERNIA REPAIR INGUINAL ADULT WITH MESH;  Surgeon: Clayburn Pert, MD;  Location: [facility] OR[facility];  Service: General;   Laterality: Left;  . TETRALOGY OF FALLOT REPAIR  [address] [place]  . TONSILLECTOMY    . UMBILICAL HERNIA REPAIR N/A 04/20/2016   Procedure: HERNIA REPAIR UMBILICAL ADULT WITH MESH ;  Surgeon: Clayburn Pert, MD;  Location: [facility] OR[facility];  Service: General;  Laterality: N/A;    Prior to Admission medications   Medication Sig Start Date End Date Taking? Authorizing Provider  albuterol (PROVENTIL HFA;VENTOLIN HFA) 108 (90 BASE) MCG/ACT inhaler Inhale 2 puffs into the lungs every 6 (six) hours as needed.   Yes [provider]  aspirin EC 81 MG tablet Take by mouth.   Yes [provider]  hydrochlorothiazide (HYDRODIURIL) 25 MG tablet TAKE 1 TABLET BY MOUTH  DAILY 12/02/18  Yes Crecencio Mc, MD  metoprolol tartrate (LOPRESSOR) 25 MG tablet Take 25 mg by mouth daily.   Yes [provider]  predniSONE (DELTASONE) 10 MG tablet Take 5 mg by mouth.  07/29/18  Yes [provider]  atorvastatin (LIPITOR) 10 MG tablet TAKE 1 TABLET BY MOUTH  DAILY 01/13/19   Crecencio Mc, MD  azelastine (ASTELIN) 0.1 % nasal spray Place into the nose. 07/29/18   [provider]  budesonide-formoterol (SYMBICORT) 160-4.5 MCG/ACT inhaler Inhale 2 puffs into the lungs 2 (two) times daily.    [provider]  Cholecalciferol (VITAMIN D) 2000 units CAPS Take 2,000 Units by mouth every morning.     [provider]  fluticasone (FLONASE) 50 MCG/ACT nasal spray Place 2 sprays into both nostrils at bedtime.  [provider]  montelukast (SINGULAIR) 10 MG tablet Take 10 mg by mouth at bedtime.  03/07/16   [provider]  omeprazole (PRILOSEC) 40 MG capsule Take by mouth. 02/06/18 07/29/19  [provider]    Allergies as of 05/19/2019 - Review Complete 05/19/2019  Allergen Reaction Noted  . Other Swelling 07/18/2013  . Fish allergy Swelling 02/11/2015    Family History  Problem Relation Age of Onset  . Heart disease Father 79        CAD  . Cancer Brother 42       liver CA mets to brain   . Cancer Brother        colon Ca , stomach CA    Social History   Socioeconomic History  . Marital status: Married    Spouse name: Not on file  . Number of children: Not on file  . Years of education: Not on file  . Highest education level: Not on file  Occupational History  . Occupation: retired Optometrist     Comment: travels  to [place] weekly   Tobacco Use  . Smoking status: Never Smoker  . Smokeless tobacco: Never Used  Substance and Sexual Activity  . Alcohol use: Yes    Alcohol/week: 14.0 standard drinks    Types: 5 Cans of beer, 4 Shots of liquor, 5 Glasses of wine per week    Comment:    . Drug use: No  . Sexual activity: Not Currently  Other Topics Concern  . Not on file  Social History Narrative  . Not on file   Social Determinants of Health   Financial Resource Strain: Low Risk   . Difficulty of Paying Living Expenses: Not hard at all  Food Insecurity: No Food Insecurity  . Worried About Charity fundraiser in the Last Year: Never true  . Ran Out of Food in the Last Year: Never true  Transportation Needs: No Transportation Needs  . Lack of Transportation (Medical): No  . Lack of Transportation (Non-Medical): No  Physical Activity: Insufficiently Active  . Days of Exercise per Week: 2 days  . Minutes of Exercise per Session: 20 min  Stress: No Stress Concern Present  . Feeling of Stress : Not at all  Social Connections:   . Frequency of Communication with Friends and Family:   . Frequency of Social Gatherings with Friends and Family:   . Attends Religious Services:   . Active Member of Clubs or Organizations:   . Attends Archivist Meetings:   Marland Kitchen Marital Status:   Intimate Partner Violence: Not At Risk  . Fear of Current or Ex-Partner: No  . Emotionally Abused: No  . Physically Abused: No  . Sexually Abused: No    Review of Systems: See HPI, otherwise negative ROS  Physical  Exam: BP (!) 134/92   Pulse 73   Temp 98.2 F (36.8 C) (Temporal)   Resp 16   Ht 6' (1.829 m)   Wt 102.1 kg   SpO2 96%   BMI 30.52 kg/m  General:   Alert,  pleasant and cooperative in NAD Head:  Normocephalic and atraumatic. Neck:  Supple; no masses or thyromegaly. Lungs:  Clear throughout to auscultation.    Heart:  Regular rate and rhythm. Abdomen:  Soft, nontender and nondistended. Normal bowel sounds, without guarding, and without rebound.   Neurologic:  Alert and  oriented x4;  grossly normal neurologically.  Impression/Plan: [name] is here for an colonoscopy  to be performed for a history of adenomatous polyps 03/2013  Risks, benefits, limitations, and alternatives regarding  colonoscopy have been reviewed with the patient.  Questions have been answered.  All parties agreeable.   Charles Lame, MD  06/17/2019, 8:54 AM

## 2019-06-18 ENCOUNTER — Encounter: Payer: Self-pay | Admitting: Gastroenterology

## 2019-06-18 ENCOUNTER — Encounter: Payer: Self-pay | Admitting: *Deleted

## 2019-06-18 LAB — SURGICAL PATHOLOGY

## 2019-06-19 NOTE — Anesthesia Postprocedure Evaluation (Signed)
Anesthesia Post Note  Patient: Charles Mccann  Procedure(s) Performed: COLONOSCOPY WITH PROPOFOL (N/A )  Patient location during evaluation: PACU Anesthesia Type: General Level of consciousness: awake and alert Pain management: pain level controlled Vital Signs Assessment: post-procedure vital signs reviewed and stable Respiratory status: spontaneous breathing, nonlabored ventilation and respiratory function stable Cardiovascular status: blood pressure returned to baseline and stable Postop Assessment: no apparent nausea or vomiting Anesthetic complications: no     Last Vitals:  Vitals:   06/17/19 0930 06/17/19 0940  BP: 133/76 126/81  Pulse: 63 63  Resp: 15 16  Temp:    SpO2: 99% 100%    Last Pain:  Vitals:   06/17/19 0910  TempSrc: Temporal                 [name]

## 2019-08-01 ENCOUNTER — Other Ambulatory Visit: Payer: Self-pay

## 2019-08-01 MED ORDER — ATORVASTATIN CALCIUM 10 MG PO TABS: 10.0000 mg | ORAL_TABLET | Freq: Every day | ORAL | 3 refills | Status: DC

## 2019-09-05 ENCOUNTER — Ambulatory Visit (INDEPENDENT_AMBULATORY_CARE_PROVIDER_SITE_OTHER): Payer: Medicare Other

## 2019-09-05 VITALS — Ht 72.0 in | Wt 225.0 lb

## 2019-09-05 DIAGNOSIS — Z0000 Encounter for general adult medical examination without abnormal findings: Secondary | ICD-10-CM

## 2019-09-05 NOTE — Progress Notes (Addendum)
Subjective:   Charles Mccann is a 69 y.o. male who presents for Medicare Annual/Subsequent preventive examination.  Review of Systems    No ROS.  Medicare Wellness Virtual Visit.    Cardiac Risk Factors include: advanced age (>37men, >33 women);hypertension     Objective:    Today's Vitals   09/05/19 1108  Weight: (!) 225 lb (102.1 kg)  Height: 6' (1.829 m)   Body mass index is 30.52 kg/m.  Advanced Directives 09/05/2019 09/05/2019 06/17/2019 09/04/2018 02/21/2017 04/20/2016 03/15/2016  Does Patient Have a Medical Advance Directive? Yes Yes Yes Yes Yes Yes Yes  Type of Paramedic of [place];Living will [place];Living will [place];Living will [place];Living will [place];Living will [place];Living will [place];Living will  Does patient want to make changes to medical advance directive? No - Patient declined No - Patient declined - No - Patient declined No - Patient declined - No - Patient declined  Copy of [place] in Chart? No - copy requested No - copy requested No - copy requested No - copy requested No - copy requested - No - copy requested    Current Medications (verified) Outpatient Encounter Medications as of 09/05/2019  Medication Sig   Meloxicam 10 MG CAPS    albuterol (PROVENTIL HFA;VENTOLIN HFA) 108 (90 BASE) MCG/ACT inhaler Inhale 2 puffs into the lungs every 6 (six) hours as needed.   aspirin EC 81 MG tablet Take by mouth.   atorvastatin (LIPITOR) 10 MG tablet Take 1 tablet (10 mg total) by mouth daily.   azelastine (ASTELIN) 0.1 % nasal spray Place into the nose.   budesonide-formoterol (SYMBICORT) 160-4.5 MCG/ACT inhaler Inhale 2 puffs into the lungs 2 (two) times daily.   Cholecalciferol (VITAMIN D) 2000 units CAPS Take 2,000 Units by mouth every morning.    fluticasone (FLONASE) 50 MCG/ACT nasal  spray Place 2 sprays into both nostrils at bedtime.    hydrochlorothiazide (HYDRODIURIL) 25 MG tablet TAKE 1 TABLET BY MOUTH  DAILY   metoprolol tartrate (LOPRESSOR) 25 MG tablet Take 25 mg by mouth daily.   montelukast (SINGULAIR) 10 MG tablet Take 10 mg by mouth at bedtime.    omeprazole (PRILOSEC) 40 MG capsule Take by mouth.   predniSONE (DELTASONE) 10 MG tablet Take 5 mg by mouth.    No facility-administered encounter medications on file as of 09/05/2019.    Allergies (verified) Other and Fish allergy   History: Past Medical History:  Diagnosis Date   Arthritis    Asthma    managed by Raul Del   Bronchitis, chronic obstructive, with exacerbation ([place]) 04/26/2014   COPD (chronic obstructive pulmonary disease) ([place])    GERD (gastroesophageal reflux disease)    controlled only with nexium priro prevacid failutre   Heart murmur    Hypercholesterolemia    Hyperlipidemia with target LDL less than 100 05/28/2011   Hypertension    Impotence due to erectile dysfunction 04/26/2014   Left inguinal hernia 04/07/2016   Obstructive sleep apnea of adult 2012   on CPAP  tolerating ,  12 cm H20 , room air    Palpitations 11/27/2011   Tetralogy of Fallot    Thoracic aortic aneurysm [facility])    Treadmill stress test negative for angina pectoris 2878   Umbilical hernia without obstruction and without gangrene 03/10/2016   Past Surgical History:  Procedure Laterality Date   CARDIAC CATHETERIZATION  CATARACT EXTRACTION W/PHACO Right 11/16/2014   Procedure: CATARACT EXTRACTION PHACO AND INTRAOCULAR LENS PLACEMENT ([place]);  Surgeon: Estill Cotta, MD;  Location: [facility] ORS;  Service: Ophthalmology;  Laterality: Right;  QVZ#[id number] H US:01:03.9 AP%:24.3% CDE:30.12   CATARACT EXTRACTION W/PHACO Left 02/21/2017   Procedure: CATARACT EXTRACTION PHACO AND INTRAOCULAR LENS PLACEMENT ([place]) LEFT;  Surgeon: Leandrew Koyanagi, MD;  Location: [facility];  Service: Ophthalmology;  Laterality: Left;   requests later   COLONOSCOPY WITH PROPOFOL N/A 06/17/2019   Procedure: COLONOSCOPY WITH PROPOFOL;  Surgeon: [name], MD;  Location: [facility] ENDOSCOPY;  Service: Endoscopy;  Laterality: N/A;   EYE SURGERY     INGUINAL HERNIA REPAIR Left 04/20/2016   Procedure: HERNIA REPAIR INGUINAL ADULT WITH MESH;  Surgeon: Clayburn Pert, MD;  Location: [facility] ORS;  Service: General;  Laterality: Left;   TETRALOGY OF FALLOT REPAIR  1974   [facility] [place]   TONSILLECTOMY     UMBILICAL HERNIA REPAIR N/A 04/20/2016   Procedure: HERNIA REPAIR UMBILICAL ADULT WITH MESH ;  Surgeon: Clayburn Pert, MD;  Location: [facility] ORS;  Service: General;  Laterality: N/A;   Family History  Problem Relation Age of Onset   Heart disease Father 29       CAD   Cancer Brother 80       liver CA mets to brain    Cancer Brother        colon Ca , stomach CA   Social History   Socioeconomic History   Marital status: Married    Spouse name: Not on file   Number of children: Not on file   Years of education: Not on file   Highest education level: Not on file  Occupational History   Occupation: retired Optometrist     Comment: travels  to [place] weekly   Tobacco Use   Smoking status: Never Smoker   Smokeless tobacco: Never Used  Scientific laboratory technician Use: Never used  Substance and Sexual Activity   Alcohol use: Yes    Alcohol/week: 14.0 standard drinks    Types: 5 Cans of beer, 4 Shots of liquor, 5 Glasses of wine per week    Comment:     Drug use: No   Sexual activity: Not Currently  Other Topics Concern   Not on file  Social History Narrative   Not on file   Social Determinants of Health   Financial Resource Strain:    Difficulty of Paying Living Expenses:   Food Insecurity: No Food Insecurity   Worried About Running Out of Food in the Last Year: Never true   Ran Out of Food in the Last Year: Never true  Transportation Needs: No Transportation Needs   Lack of Transportation (Medical): No   Lack of  Transportation (Non-Medical): No  Physical Activity: Sufficiently Active   Days of Exercise per Week: 3 days   Minutes of Exercise per Session: 60 min  Stress: No Stress Concern Present   Feeling of Stress : Not at all  Social Connections: Unknown   Frequency of Communication with Friends and Family: Not on file   Frequency of Social Gatherings with Friends and Family: Not on file   Attends Religious Services: Not on file   Active Member of Clubs or Organizations: Not on file   Attends Archivist Meetings: Not on file   Marital Status: Married    Tobacco Counseling Counseling given: Not Answered   Clinical Intake:  Pre-visit preparation completed: Yes  Diabetes: No  How often do you need to have someone help you when you read instructions, pamphlets, or other written materials from your doctor or pharmacy?: 1 - Never    Interpreter Needed?: No      Activities of Daily Living In your present state of health, do you have any difficulty performing the following activities: 09/05/2019  Hearing? N  Vision? N  Difficulty concentrating or making decisions? N  Walking or climbing stairs? Y  Comment L knee pain  Dressing or bathing? N  Doing errands, shopping? N  Preparing Food and eating ? N  Using the Toilet? N  In the past six months, have you accidently leaked urine? N  Do you have problems with loss of bowel control? N  Managing your Medications? N  Managing your Finances? N  Housekeeping or managing your Housekeeping? N  Some recent data might be hidden    Patient Care Team: Crecencio Mc, MD as PCP - General (Internal Medicine)  Indicate any recent Medical Services you may have received from other than [facility] providers in the past year (date may be approximate).     Assessment:   This is a routine wellness examination for Charles Mccann.  I connected with Charles Mccann today by telephone and verified that I am speaking with the correct person using two  identifiers. Location patient: home Location provider: work Persons participating in the virtual visit: patient, Marine scientist.    I discussed the limitations, risks, security and privacy concerns of performing an evaluation and management service by telephone and the availability of in person appointments. The patient expressed understanding and verbally consented to this telephonic visit.    Interactive audio and video telecommunications were attempted between this provider and patient, however failed, due to patient having technical difficulties OR patient did not have access to video capability.  We continued and completed visit with audio only.  Some vital signs may be absent or patient reported.   Hearing/Vision screen  Hearing Screening   125Hz  250Hz  500Hz  1000Hz  2000Hz  3000Hz  4000Hz  6000Hz  8000Hz   Right ear:           Left ear:           Comments: Patient is able to hear conversational tones without difficulty.  No issues reported.  Vision Screening Comments: Followed by  Wears corrective lenses Cataract extraction, bilateral Visual acuity not assessed, virtual visit.  They have seen their ophthalmologist in the last 12 months.    Dietary issues and exercise activities discussed: Current Exercise Habits: Home exercise routine, Type of exercise: walking (golfing), Intensity: Mild  Healthy diet Good water intake  Goals       Patient Stated     Follow up with Primary Care Provider (pt-stated)      As needed       Depression Screen PHQ 2/9 Scores 09/05/2019 09/04/2018 04/23/2017 02/19/2012  PHQ - 2 Score 0 0 0 0  PHQ- 9 Score - - 1 -    Fall Risk Fall Risk  09/05/2019 05/29/2019 11/14/2018 09/04/2018 04/23/2017  Falls in the past year? 0 0 0 0 No  Number falls in past yr: 0 - - - -  Follow up Falls evaluation completed Falls evaluation completed Falls evaluation completed - -   Handrails in use when climbing stairs? Yes  Home free of loose throw rugs in walkways, pet beds,  electrical cords, etc? Yes  Adequate lighting in your home to reduce risk of falls? Yes   ASSISTIVE DEVICES UTILIZED  TO PREVENT FALLS: Life alert? No  Use of a cane, walker or w/c? No   TIMED UP AND GO: Was the test performed? No . Virtual visit.   Cognitive Function:  Patient is alert and oriented x3.  Enjoys crossword puzzles for brain health exercises.  Denies difficulty focusing, concentrating or making decisions, memory loss.    6CIT Screen 09/04/2018  What Year? 0 points  What month? 0 points  What time? 0 points  Count back from 20 0 points  Months in reverse 0 points  Repeat phrase 0 points  Total Score 0    Immunizations Immunization History  Administered Date(s) Administered   Fluad Quad(high Dose 65+) 11/14/2018   Influenza Inj Mdck Quad Pf 11/17/2015   Influenza Split 11/27/2011   Influenza-Unspecified 11/25/2012, 11/23/2013, 12/26/2016, 11/20/2017   Moderna SARS-COVID-2 Vaccination 02/25/2019, 03/25/2019   Pneumococcal Conjugate-13 04/24/2014, 11/20/2017   Pneumococcal Polysaccharide-23 11/27/2011   Tdap 01/09/2013   Zoster 11/25/2012   Health Maintenance Health Maintenance  Topic Date Due   PNA vac Low Risk Adult (2 of 2 - PPSV23) 11/21/2018   INFLUENZA VACCINE  09/07/2019   TETANUS/TDAP  01/10/2023   COLONOSCOPY  06/16/2024   COVID-19 Vaccine  Completed   Hepatitis C Screening  Completed    Shingrix vaccine- Rx faxed to pharmacy. Notified in mychart per request.   Pneumococcal 23- Rx faxed to pharmacy. Notified in mychart per request.   Dental Screening: Recommended annual dental exams for proper oral hygiene  Community Resource Referral / Chronic Care Management: CRR required this visit?  No   CCM required this visit?  No      Plan:   Keep all routine maintenance appointments.   Follow up 12/01/19 @ 10:30  I have personally reviewed and noted the following in the patient's chart:   Medical and social history Use of alcohol, tobacco  or illicit drugs  Current medications and supplements Functional ability and status Nutritional status Physical activity Advanced directives List of other physicians Hospitalizations, surgeries, and ER visits in previous 12 months Vitals Screenings to include cognitive, depression, and falls Referrals and appointments  In addition, I have reviewed and discussed with patient certain preventive protocols, quality metrics, and best practice recommendations. A written personalized care plan for preventive services as well as general preventive health recommendations were provided to patient via mychart.     OBrien-Blaney, Eytan Carrigan L, LPN   9/75/8832    I have reviewed the above information and agree with above.   Deborra Medina, MD

## 2019-09-05 NOTE — Patient Instructions (Addendum)
Charles Mccann , Thank you for taking time to come for your Medicare Wellness Visit. I appreciate your ongoing commitment to your health goals. Please review the following plan we discussed and let me know if I can assist you in the future.   These are the goals we discussed: Goals      Patient Stated   .  Follow up with Primary Care Provider (pt-stated)      As needed       This is a list of the screening recommended for you and due dates:  Health Maintenance  Topic Date Due  . Pneumonia vaccines (2 of 2 - PPSV23) 11/21/2018  . Flu Shot  09/07/2019  . Tetanus Vaccine  01/10/2023  . Colon Cancer Screening  06/16/2024  . COVID-19 Vaccine  Completed  .  Hepatitis C: One time screening is recommended by Center for Disease Control  (CDC) for  adults born from [birth year] through 1964.   Completed   Immunizations Immunization History  Administered Date(s) Administered  . Fluad Quad(high Dose 65+) 11/14/2018  . Influenza Inj Mdck Quad Pf 11/17/2015  . Influenza Split 11/27/2011  . Influenza-Unspecified 11/25/2012, 11/23/2013, 12/26/2016, 11/20/2017  . Moderna SARS-COVID-2 Vaccination 02/25/2019, 03/25/2019  . Pneumococcal Conjugate-13 04/24/2014, 11/20/2017  . Pneumococcal Polysaccharide-23 11/27/2011  . Tdap 01/09/2013  . Zoster 11/25/2012   Keep all routine maintenance appointments.   Follow up 12/01/19 @ 10:30  Advanced directives: End of life planning; Advance aging; Advanced directives discussed.  Copy of current HCPOA/Living Will requested.    Conditions/risks identified: none new  Follow up in one year for your annual wellness visit.   Prescription for shingrix and pneumococcal vaccine electronically sent to local pharmacy.   Preventive Care 25 Years and Older, Male Preventive care refers to lifestyle choices and visits with your health care provider that can promote health and wellness. What does preventive care include?  A yearly physical exam. This is also called an  annual well check.  Dental exams once or twice a year.  Routine eye exams. Ask your health care provider how often you should have your eyes checked.  Personal lifestyle choices, including:  Daily care of your teeth and gums.  Regular physical activity.  Eating a healthy diet.  Avoiding tobacco and drug use.  Limiting alcohol use.  Practicing safe sex.  Taking low doses of aspirin every day.  Taking vitamin and mineral supplements as recommended by your health care provider. What happens during an annual well check? The services and screenings done by your health care provider during your annual well check will depend on your age, overall health, lifestyle risk factors, and family history of disease. Counseling  Your health care provider may ask you questions about your:  Alcohol use.  Tobacco use.  Drug use.  Emotional well-being.  Home and relationship well-being.  Sexual activity.  Eating habits.  History of falls.  Memory and ability to understand (cognition).  Work and work Statistician. Screening  You may have the following tests or measurements:  Height, weight, and BMI.  Blood pressure.  Lipid and cholesterol levels. These may be checked every 5 years, or more frequently if you are over 26 years old.  Skin check.  Lung cancer screening. You may have this screening every year starting at age 58 if you have a 30-pack-year history of smoking and currently smoke or have quit within the past 15 years.  Fecal occult blood test (FOBT) of the stool. You may have this test  every year starting at age 69.  Flexible sigmoidoscopy or colonoscopy. You may have a sigmoidoscopy every 5 years or a colonoscopy every 10 years starting at age 77.  Prostate cancer screening. Recommendations will vary depending on your family history and other risks.  Hepatitis C blood test.  Hepatitis B blood test.  Sexually transmitted disease (STD) testing.  Diabetes  screening. This is done by checking your blood sugar (glucose) after you have not eaten for a while (fasting). You may have this done every 1-3 years.  Abdominal aortic aneurysm (AAA) screening. You may need this if you are a current or former smoker.  Osteoporosis. You may be screened starting at age 57 if you are at high risk. Talk with your health care provider about your test results, treatment options, and if necessary, the need for more tests. Vaccines  Your health care provider may recommend certain vaccines, such as:  Influenza vaccine. This is recommended every year.  Tetanus, diphtheria, and acellular pertussis (Tdap, Td) vaccine. You may need a Td booster every 10 years.  Zoster vaccine. You may need this after age 15.  Pneumococcal 13-valent conjugate (PCV13) vaccine. One dose is recommended after age 63.  Pneumococcal polysaccharide (PPSV23) vaccine. One dose is recommended after age 29. Talk to your health care provider about which screenings and vaccines you need and how often you need them. This information is not intended to replace advice given to you by your health care provider. Make sure you discuss any questions you have with your health care provider. Document Released: 02/19/2015 Document Revised: 10/13/2015 Document Reviewed: 11/24/2014 Elsevier Interactive Patient Education  2017 [place] Prevention in the Home Falls can cause injuries. They can happen to people of all ages. There are many things you can do to make your home safe and to help prevent falls. What can I do on the outside of my home?  Regularly fix the edges of walkways and driveways and fix any cracks.  Remove anything that might make you trip as you walk through a door, such as a raised step or threshold.  Trim any bushes or trees on the path to your home.  Use bright outdoor lighting.  Clear any walking paths of anything that might make someone trip, such as rocks or  tools.  Regularly check to see if handrails are loose or broken. Make sure that both sides of any steps have handrails.  Any raised decks and porches should have guardrails on the edges.  Have any leaves, snow, or ice cleared regularly.  Use sand or salt on walking paths during winter.  Clean up any spills in your garage right away. This includes oil or grease spills. What can I do in the bathroom?  Use night lights.  Install grab bars by the toilet and in the tub and shower. Do not use towel bars as grab bars.  Use non-skid mats or decals in the tub or shower.  If you need to sit down in the shower, use a plastic, non-slip stool.  Keep the floor dry. Clean up any water that spills on the floor as soon as it happens.  Remove soap buildup in the tub or shower regularly.  Attach bath mats securely with double-sided non-slip rug tape.  Do not have throw rugs and other things on the floor that can make you trip. What can I do in the bedroom?  Use night lights.  Make sure that you have a light by your bed  that is easy to reach.  Do not use any sheets or blankets that are too big for your bed. They should not hang down onto the floor.  Have a firm chair that has side arms. You can use this for support while you get dressed.  Do not have throw rugs and other things on the floor that can make you trip. What can I do in the kitchen?  Clean up any spills right away.  Avoid walking on wet floors.  Keep items that you use a lot in easy-to-reach places.  If you need to reach something above you, use a strong step stool that has a grab bar.  Keep electrical cords out of the way.  Do not use floor polish or wax that makes floors slippery. If you must use wax, use non-skid floor wax.  Do not have throw rugs and other things on the floor that can make you trip. What can I do with my stairs?  Do not leave any items on the stairs.  Make sure that there are handrails on both  sides of the stairs and use them. Fix handrails that are broken or loose. Make sure that handrails are as long as the stairways.  Check any carpeting to make sure that it is firmly attached to the stairs. Fix any carpet that is loose or worn.  Avoid having throw rugs at the top or bottom of the stairs. If you do have throw rugs, attach them to the floor with carpet tape.  Make sure that you have a light switch at the top of the stairs and the bottom of the stairs. If you do not have them, ask someone to add them for you. What else can I do to help prevent falls?  Wear shoes that:  Do not have high heels.  Have rubber bottoms.  Are comfortable and fit you well.  Are closed at the toe. Do not wear sandals.  If you use a stepladder:  Make sure that it is fully opened. Do not climb a closed stepladder.  Make sure that both sides of the stepladder are locked into place.  Ask someone to hold it for you, if possible.  Clearly mark and make sure that you can see:  Any grab bars or handrails.  First and last steps.  Where the edge of each step is.  Use tools that help you move around (mobility aids) if they are needed. These include:  Canes.  Walkers.  Scooters.  Crutches.  Turn on the lights when you go into a dark area. Replace any light bulbs as soon as they burn out.  Set up your furniture so you have a clear path. Avoid moving your furniture around.  If any of your floors are uneven, fix them.  If there are any pets around you, be aware of where they are.  Review your medicines with your doctor. Some medicines can make you feel dizzy. This can increase your chance of falling. Ask your doctor what other things that you can do to help prevent falls. This information is not intended to replace advice given to you by your health care provider. Make sure you discuss any questions you have with your health care provider. Document Released: 11/19/2008 Document Revised:  07/01/2015 Document Reviewed: 02/27/2014 Elsevier Interactive Patient Education  2017 [employer].

## 2019-12-01 ENCOUNTER — Other Ambulatory Visit: Payer: Self-pay

## 2019-12-01 ENCOUNTER — Encounter: Payer: Self-pay | Admitting: Internal Medicine

## 2019-12-01 ENCOUNTER — Ambulatory Visit (INDEPENDENT_AMBULATORY_CARE_PROVIDER_SITE_OTHER): Payer: Medicare Other | Admitting: Internal Medicine

## 2019-12-01 VITALS — BP 134/78 | HR 63 | Temp 98.4°F | Resp 15 | Ht 72.0 in | Wt 226.2 lb

## 2019-12-01 DIAGNOSIS — Z23 Encounter for immunization: Secondary | ICD-10-CM | POA: Diagnosis not present

## 2019-12-01 DIAGNOSIS — I712 Thoracic aortic aneurysm, without rupture, unspecified: Secondary | ICD-10-CM

## 2019-12-01 DIAGNOSIS — Z125 Encounter for screening for malignant neoplasm of prostate: Secondary | ICD-10-CM | POA: Diagnosis not present

## 2019-12-01 DIAGNOSIS — R7303 Prediabetes: Secondary | ICD-10-CM | POA: Diagnosis not present

## 2019-12-01 DIAGNOSIS — C61 Malignant neoplasm of prostate: Secondary | ICD-10-CM

## 2019-12-01 DIAGNOSIS — Z0000 Encounter for general adult medical examination without abnormal findings: Secondary | ICD-10-CM

## 2019-12-01 DIAGNOSIS — M175 Other unilateral secondary osteoarthritis of knee: Secondary | ICD-10-CM

## 2019-12-01 DIAGNOSIS — E785 Hyperlipidemia, unspecified: Secondary | ICD-10-CM

## 2019-12-01 DIAGNOSIS — Z86010 Personal history of colonic polyps: Secondary | ICD-10-CM

## 2019-12-01 DIAGNOSIS — M1712 Unilateral primary osteoarthritis, left knee: Secondary | ICD-10-CM | POA: Insufficient documentation

## 2019-12-01 LAB — LIPID PANEL
Cholesterol: 153 mg/dL (ref 0–200)
HDL: 52.1 mg/dL (ref >39.00)
LDL Cholesterol: 82 mg/dL (ref 0–99)
NonHDL: 101.02
Total CHOL/HDL Ratio: 3
Triglycerides: 97 mg/dL (ref 0.0–149.0)
VLDL: 19.4 mg/dL (ref 0.0–40.0)

## 2019-12-01 LAB — COMPREHENSIVE METABOLIC PANEL
ALT: 31 U/L (ref 0–53)
AST: 20 U/L (ref 0–37)
Albumin: 4.3 g/dL (ref 3.5–5.2)
Alkaline Phosphatase: 49 U/L (ref 39–117)
BUN: 21 mg/dL (ref 6–23)
CO2: 31 mEq/L (ref 19–32)
Calcium: 9.4 mg/dL (ref 8.4–10.5)
Chloride: 101 mEq/L (ref 96–112)
Creatinine, Ser: 1.1 mg/dL (ref 0.40–1.50)
GFR: 69.04 mL/min (ref >60.00)
Glucose, Bld: 93 mg/dL (ref 70–99)
Potassium: 4.1 mEq/L (ref 3.5–5.1)
Sodium: 141 mEq/L (ref 135–145)
Total Bilirubin: 1.2 mg/dL (ref 0.2–1.2)
Total Protein: 6.2 g/dL (ref 6.0–8.3)

## 2019-12-01 LAB — PSA, MEDICARE: PSA: 1.1 ng/ml (ref 0.10–4.00)

## 2019-12-01 LAB — HEMOGLOBIN A1C: Hgb A1c MFr Bld: 6.3 % (ref 4.6–6.5)

## 2019-12-01 MED ORDER — ZOSTER VAC RECOMB ADJUVANTED 50 MCG/0.5ML IM SUSR
0.5000 mL | Freq: Once | INTRAMUSCULAR | 1 refills | Status: AC
Start: 2019-12-01 — End: 2019-12-01

## 2019-12-01 NOTE — Assessment & Plan Note (Addendum)
Per last imaging study Dec 2020 at [facility]:  Dilation of the aortic root andascending aorta is again noted, measuring 5.6 cm at the aortic root, previously 5.5 cm. The tubular portion of the ascending aorta at the level of the pulmonary artery measures 4.6 x 4.5 cm in greatest orthogonal dimensions, previously 4.6 x 4.3 cm.   Advised to aim for 120/70 on home readings by considering adding an additional dose of metoprolol in the evening

## 2019-12-01 NOTE — Assessment & Plan Note (Signed)
Loss of cartilage,  S/p drainage of 50 ccs and steroid injection by Earnestine Leys 2 weeks ago.  Currently in less pain  Using meloxicam and tylenol .   No longer walking,  And  golf is now painful  Plays 3 times per week but can't walk the course anymore

## 2019-12-01 NOTE — Progress Notes (Signed)
Subjective:  Patient ID: Charles Mccann, male    DOB: [birth year]  Age: 69 y.o. MRN: [id number]  CC: The primary encounter diagnosis was Hyperlipidemia with target LDL less than 100. Diagnoses of Thoracic aortic aneurysm without rupture ([place]), Other secondary osteoarthritis of left knee, Prostate cancer ([place]), Prediabetes, Prostate cancer screening, Encounter for preventive health examination, History of colonic polyps, and Need for pneumococcal vaccine were also pertinent to this visit.  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  Safety protocols were in place, including screening questions prior to the visit, additional usage of staff PPE, and extensive cleaning of exam room while observing appropriate contact time as indicated for disinfecting solutions.    Patient has received three doses of the available COVID 19 vaccine without complications.  Patient continues to mask when outside of the home except when walking in yard or at safe distances from others .  Patient doesn't feel as well as he did last year. Not going to the gym anymore. Having more fatigue and OA pain  admits increased use of alcohol which causes him to have a higher elevated heart rate per fit Bit at night.  She denies any change in mood resulting  from the pandemic's restriction of activities and socialization.    TAA:   Followed by [facility]  Once a year.  History of Tetralogy of Fallot repair.   Told to keep BP 130/80 and his home readings have been at this goal.  Last imaging was Dec and slight increase was noted.    Dermatology:  [facility] once a years,  several lesions frozen off  OSA:    Diagnosed by sleep study. He is wearing  CPAP every night a minimum of 8 hours per night and notes improved daytime wakefulness and decreased fatigue   Cc:  Chronic  left knee pain  Due to DJD.  Recent aspiration of 50 cc fluid and injection of steroids.  Postponing TKR for as long as possible .  Becoming less physically active due to chronic pain    Outpatient Medications Prior to Visit  Medication Sig Dispense Refill  . albuterol (PROVENTIL HFA;VENTOLIN HFA) 108 (90 BASE) MCG/ACT inhaler Inhale 2 puffs into the lungs every 6 (six) hours as needed.    Marland Kitchen aspirin EC 81 MG tablet Take by mouth.    Marland Kitchen atorvastatin (LIPITOR) 10 MG tablet Take 1 tablet (10 mg total) by mouth daily. 90 tablet 3  . azelastine (ASTELIN) 0.1 % nasal spray Place into the nose.    . budesonide-formoterol (SYMBICORT) 160-4.5 MCG/ACT inhaler Inhale 2 puffs into the lungs 2 (two) times daily.    . Cholecalciferol (VITAMIN D) 2000 units CAPS Take 2,000 Units by mouth every morning.     . Coenzyme Q10 (CO Q 10 PO) Take 500 mg by mouth daily.    . fluticasone (FLONASE) 50 MCG/ACT nasal spray Place 2 sprays into both nostrils at bedtime.     . hydrochlorothiazide (HYDRODIURIL) 25 MG tablet TAKE 1 TABLET BY MOUTH  DAILY 90 tablet 3  . Magnesium 200 MG TABS Take 1 tablet by mouth daily.    . meloxicam (MOBIC) 15 MG tablet Take 15 mg by mouth daily.    . metoprolol tartrate (LOPRESSOR) 25 MG tablet Take 25 mg by mouth daily.    Marland Kitchen  montelukast (SINGULAIR) 10 MG tablet Take 10 mg by mouth at bedtime.     . Potassium 99 MG TABS Take 2 tablets by mouth daily.    . predniSONE (DELTASONE) 10 MG tablet Take 5 mg by mouth.     Marland Kitchen omeprazole (PRILOSEC) 40 MG capsule Take by mouth.    . Meloxicam 10 MG CAPS  (Patient not taking: Reported on 12/01/2019)     No facility-administered medications prior to visit.    Review of Systems;  Patient denies headache, fevers, malaise, unintentional weight loss, skin rash, eye pain, sinus congestion and sinus pain, sore throat, dysphagia,  hemoptysis , cough, dyspnea, wheezing, chest pain, palpitations, orthopnea, edema, abdominal pain, nausea, melena, diarrhea,  constipation, flank pain, dysuria, hematuria, urinary  Frequency, nocturia, numbness, tingling, seizures,  Focal weakness, Loss of consciousness,  Tremor, insomnia, depression, anxiety, and suicidal ideation.      Objective:  BP 134/78 (BP Location: Left Arm, Patient Position: Sitting, Cuff Size: Large)   Pulse 63   Temp 98.4 F (36.9 C) (Oral)   Resp 15   Ht 6' (1.829 m)   Wt 226 lb 3.2 oz (102.6 kg)   SpO2 97%   BMI 30.68 kg/m   BP Readings from Last 3 Encounters:  12/01/19 134/78  06/17/19 126/81  05/29/19 130/84    Wt Readings from Last 3 Encounters:  12/01/19 226 lb 3.2 oz (102.6 kg)  09/05/19 (!) 225 lb (102.1 kg)  06/17/19 225 lb (102.1 kg)    General appearance: alert, cooperative and appears stated age Ears: normal TM's and external ear canals both ears Throat: lips, mucosa, and tongue normal; teeth and gums normal Neck: no adenopathy, no carotid bruit, supple, symmetrical, trachea midline and thyroid not enlarged, symmetric, no tenderness/mass/nodules Back: symmetric, no curvature. ROM normal. No CVA tenderness. Lungs: clear to auscultation bilaterally Heart: regular rate and rhythm, S1, S2 normal, no murmur, click, rub or gallop Abdomen: soft, non-tender; bowel sounds normal; no masses,  no organomegaly Pulses: 2+ and symmetric Skin: Skin color, texture, turgor normal. No rashes or lesions Lymph nodes: Cervical, supraclavicular, and axillary nodes normal.  Lab Results  Component Value Date   HGBA1C 6.3 12/01/2019   HGBA1C 6.1 05/29/2019   HGBA1C 6.3 11/14/2018    Lab Results  Component Value Date   CREATININE 1.10 12/01/2019   CREATININE 1.12 05/29/2019   CREATININE 1.15 11/14/2018    Lab Results  Component Value Date   WBC 7.8 04/23/2017   HGB 14.6 04/23/2017   HCT 42.8 04/23/2017   PLT 226.0 04/23/2017   GLUCOSE 93 12/01/2019   CHOL 153 12/01/2019   TRIG 97.0 12/01/2019   HDL 52.10 12/01/2019   LDLDIRECT 73.6 11/27/2011   LDLCALC 82  12/01/2019   ALT 31 12/01/2019   AST 20 12/01/2019   NA 141 12/01/2019   K 4.1 12/01/2019   CL 101 12/01/2019   CREATININE 1.10 12/01/2019   BUN 21 12/01/2019   CO2 31 12/01/2019   TSH 3.72 11/14/2018   PSA 1.10 12/01/2019   HGBA1C 6.3 12/01/2019   MICROALBUR <0.7 05/29/2019    No results found.  Assessment & Plan:   Problem List Items Addressed This Visit      Unprioritized   Hyperlipidemia with target LDL less than 100 - Primary    Managed with atorvastatin 10 mg daily ; ldl < 100 .lfts normal . No changes today   Lab Results  Component Value Date   CHOL 153 12/01/2019   HDL 52.10  12/01/2019   LDLCALC 82 12/01/2019   LDLDIRECT 73.6 11/27/2011   TRIG 97.0 12/01/2019   CHOLHDL 3 12/01/2019   Lab Results  Component Value Date   ALT 31 12/01/2019   AST 20 12/01/2019   ALKPHOS 49 12/01/2019   BILITOT 1.2 12/01/2019         Relevant Orders   Lipid panel (Completed)   Comprehensive metabolic panel (Completed)   Encounter for preventive health examination    age appropriate education and counseling updated, referrals for preventative services and immunizations addressed, dietary and smoking counseling addressed, most recent labs reviewed.  I have personally reviewed and have noted:  1) the patient's medical and social history 2) The pt's use of alcohol, tobacco, and illicit drugs 3) The patient's current medications and supplements 4) Functional ability including ADL's, fall risk, home safety risk, hearing and visual impairment 5) Diet and physical activities 6) Evidence for depression or mood disorder 7) The patient's height, weight, and BMI have been recorded in the chart  I have made referrals, and provided counseling and education based on review of the above      Aneurysm of thoracic aorta [facility])    Per last imaging study Dec 2020 at [facility]:  Dilation of the aortic root andascending aorta is again noted, measuring 5.6 cm at the aortic root, previously 5.5  cm. The tubular portion of the ascending aorta at the level of the pulmonary artery measures 4.6 x 4.5 cm in greatest orthogonal dimensions, previously 4.6 x 4.3 cm.   Advised to aim for 120/70 on home readings by considering adding an additional dose of metoprolol in the evening        Prediabetes    With weight gain noted and increased abdominal girth Optavia diet recommended       Relevant Orders   Hemoglobin A1c (Completed)   History of colonic polyps    TAS found on last colonoscopy and there is a FH.  5 yr follow up planned      Left knee DJD    Loss of cartilage,  S/p drainage of 50 ccs and steroid injection by Earnestine Leys 2 weeks ago.  Currently in less pain  Using meloxicam and tylenol .   No longer walking,  And  golf is now painful  Plays 3 times per week but can't walk the course anymore       Relevant Medications   meloxicam (MOBIC) 15 MG tablet    Other Visit Diagnoses    Prostate cancer [facility])       Prostate cancer screening       Relevant Orders   PSA, Medicare (Completed)   Need for pneumococcal vaccine       Relevant Orders   Pneumococcal polysaccharide vaccine 23-valent greater than or equal to 2yo subcutaneous/IM (Completed)      I am having Charles Mccann "Charles Mccann" start on Zoster Vaccine Adjuvanted. I am also having him maintain his fluticasone, albuterol, budesonide-formoterol, montelukast, Vitamin D, metoprolol tartrate, aspirin EC, azelastine, omeprazole, predniSONE, hydrochlorothiazide, atorvastatin, meloxicam, Coenzyme Q10 (CO Q 10 PO), Potassium, and Magnesium.  Meds ordered this encounter  Medications  . Zoster Vaccine Adjuvanted [facility]) injection    Sig: Inject 0.5 mLs into the muscle once for 1 dose.    Dispense:  1 each    Refill:  1    Medications Discontinued During This Encounter  Medication Reason  . Meloxicam 10 MG CAPS Change in therapy    Follow-up: No follow-ups  on file.   Crecencio Mc, MD

## 2019-12-01 NOTE — Assessment & Plan Note (Addendum)
Managed with atorvastatin 10 mg daily ; ldl < 100 .lfts normal . No changes today   Lab Results  Component Value Date   CHOL 153 12/01/2019   HDL 52.10 12/01/2019   LDLCALC 82 12/01/2019   LDLDIRECT 73.6 11/27/2011   TRIG 97.0 12/01/2019   CHOLHDL 3 12/01/2019   Lab Results  Component Value Date   ALT 31 12/01/2019   AST 20 12/01/2019   ALKPHOS 49 12/01/2019   BILITOT 1.2 12/01/2019

## 2019-12-01 NOTE — Assessment & Plan Note (Signed)
TAS found on last colonoscopy and there is a FH.  5 yr follow up planned

## 2019-12-01 NOTE — Assessment & Plan Note (Signed)

## 2019-12-01 NOTE — Patient Instructions (Signed)
Consider the Optavia  Diet to help you lose weight without increasing your exercise    Goal for  blood pressure Control is 120 to 130 /70 to 80  consider adding 2nd dose of metoprolol to dinnertime or evening   Moderate alcohol is 2 shots of liquor or 2 glasses of wine   You received your FINal PNEUMONIA VACCINE TODAY   The ShingRx vaccine is now available in local pharmacies and is much more protective than Zostavaxs,  It is therefore ADVISED for all interested adults over 50 to prevent shingles.   If your pharmacy is not going to give it until the [place],  You can go to the [facility] [phone] located  In the [place] building,  On the bottom floor (in thea back of the building)

## 2019-12-01 NOTE — Assessment & Plan Note (Signed)
With weight gain noted and increased abdominal girth Optavia diet recommended

## 2019-12-02 NOTE — Progress Notes (Signed)
  Your PSA (your annual screening test for prostate Cancer),  Is very low,  Which is normal.  We will repeat this annually until you are 76.     Cholesterol, liver and kidney function are normal.   Your  fasting glucose has never been  diagnostic of diabetes; but your repeat A1c of 6.3  suggests that you are at  increased  risk for developing type 2 Diabetes over the next ten years.  Reducing your daily intake of starches to 2 servings daily , eliminating refined sugars and participating in some form of tolerable daily exercise  has been shown to delay the progression to diabetes.   I would like to repeat NON FASTING labs In 6 months.  Regards,   Deborra Medina, MD

## 2019-12-08 ENCOUNTER — Other Ambulatory Visit: Payer: Self-pay

## 2019-12-08 MED ORDER — HYDROCHLOROTHIAZIDE 25 MG PO TABS: 25.0000 mg | ORAL_TABLET | Freq: Every day | ORAL | 3 refills | Status: DC

## 2020-01-23 ENCOUNTER — Telehealth: Payer: Self-pay

## 2020-01-23 NOTE — Telephone Encounter (Signed)
Pt dropped off pre op risk assessment form to be filled out and faxed to number on the bottom of form. Placed in folder up front

## 2020-01-26 NOTE — Telephone Encounter (Signed)
HE WILL NEED TO SEE  DR Raul Del FOR CLEARANCE DUE TO HIS OSA,  ASTHMA/COPD , AND CONGENITAL HEART ISSUEAS

## 2020-01-26 NOTE — Telephone Encounter (Signed)
Spoke with pt to let him know that he will need to get his clearance from Dr. Raul Del. Pt gave a verbal understanding.

## 2020-01-26 NOTE — Telephone Encounter (Signed)
Placed in red folder  

## 2020-02-02 ENCOUNTER — Telehealth: Payer: Self-pay

## 2020-02-02 NOTE — Telephone Encounter (Signed)
[facility] of [place]-Dr Lucy's office needs last ov notes faxed to [phone]

## 2020-02-02 NOTE — Telephone Encounter (Signed)
Office note has been faxed.

## 2020-02-12 DIAGNOSIS — Z01812 Encounter for preprocedural laboratory examination: Secondary | ICD-10-CM | POA: Diagnosis not present

## 2020-02-13 DIAGNOSIS — R0600 Dyspnea, unspecified: Secondary | ICD-10-CM | POA: Diagnosis not present

## 2020-02-13 DIAGNOSIS — J449 Chronic obstructive pulmonary disease, unspecified: Secondary | ICD-10-CM | POA: Diagnosis not present

## 2020-02-13 DIAGNOSIS — Z01818 Encounter for other preprocedural examination: Secondary | ICD-10-CM | POA: Diagnosis not present

## 2020-02-13 DIAGNOSIS — J4520 Mild intermittent asthma, uncomplicated: Secondary | ICD-10-CM | POA: Diagnosis not present

## 2020-02-24 DIAGNOSIS — R079 Chest pain, unspecified: Secondary | ICD-10-CM | POA: Diagnosis not present

## 2020-02-24 DIAGNOSIS — I77819 Aortic ectasia, unspecified site: Secondary | ICD-10-CM | POA: Diagnosis not present

## 2020-02-24 DIAGNOSIS — I519 Heart disease, unspecified: Secondary | ICD-10-CM | POA: Diagnosis not present

## 2020-03-01 DIAGNOSIS — M1712 Unilateral primary osteoarthritis, left knee: Secondary | ICD-10-CM | POA: Diagnosis not present

## 2020-03-01 DIAGNOSIS — Z96652 Presence of left artificial knee joint: Secondary | ICD-10-CM | POA: Diagnosis not present

## 2020-03-01 DIAGNOSIS — G8918 Other acute postprocedural pain: Secondary | ICD-10-CM | POA: Diagnosis not present

## 2020-03-08 DIAGNOSIS — M25562 Pain in left knee: Secondary | ICD-10-CM | POA: Diagnosis not present

## 2020-03-08 DIAGNOSIS — R262 Difficulty in walking, not elsewhere classified: Secondary | ICD-10-CM | POA: Diagnosis not present

## 2020-03-10 DIAGNOSIS — R262 Difficulty in walking, not elsewhere classified: Secondary | ICD-10-CM | POA: Diagnosis not present

## 2020-03-10 DIAGNOSIS — M25562 Pain in left knee: Secondary | ICD-10-CM | POA: Diagnosis not present

## 2020-03-11 DIAGNOSIS — Z96652 Presence of left artificial knee joint: Secondary | ICD-10-CM | POA: Diagnosis not present

## 2020-03-15 DIAGNOSIS — M25562 Pain in left knee: Secondary | ICD-10-CM | POA: Diagnosis not present

## 2020-03-15 DIAGNOSIS — R262 Difficulty in walking, not elsewhere classified: Secondary | ICD-10-CM | POA: Diagnosis not present

## 2020-03-17 DIAGNOSIS — R262 Difficulty in walking, not elsewhere classified: Secondary | ICD-10-CM | POA: Diagnosis not present

## 2020-03-17 DIAGNOSIS — M25562 Pain in left knee: Secondary | ICD-10-CM | POA: Diagnosis not present

## 2020-03-22 DIAGNOSIS — M25562 Pain in left knee: Secondary | ICD-10-CM | POA: Diagnosis not present

## 2020-03-22 DIAGNOSIS — R262 Difficulty in walking, not elsewhere classified: Secondary | ICD-10-CM | POA: Diagnosis not present

## 2020-03-24 DIAGNOSIS — M25562 Pain in left knee: Secondary | ICD-10-CM | POA: Diagnosis not present

## 2020-03-24 DIAGNOSIS — R262 Difficulty in walking, not elsewhere classified: Secondary | ICD-10-CM | POA: Diagnosis not present

## 2020-03-31 DIAGNOSIS — R262 Difficulty in walking, not elsewhere classified: Secondary | ICD-10-CM | POA: Diagnosis not present

## 2020-03-31 DIAGNOSIS — M25562 Pain in left knee: Secondary | ICD-10-CM | POA: Diagnosis not present

## 2020-04-02 DIAGNOSIS — M25562 Pain in left knee: Secondary | ICD-10-CM | POA: Diagnosis not present

## 2020-04-02 DIAGNOSIS — R262 Difficulty in walking, not elsewhere classified: Secondary | ICD-10-CM | POA: Diagnosis not present

## 2020-04-05 DIAGNOSIS — R262 Difficulty in walking, not elsewhere classified: Secondary | ICD-10-CM | POA: Diagnosis not present

## 2020-04-05 DIAGNOSIS — M25562 Pain in left knee: Secondary | ICD-10-CM | POA: Diagnosis not present

## 2020-04-09 DIAGNOSIS — R262 Difficulty in walking, not elsewhere classified: Secondary | ICD-10-CM | POA: Diagnosis not present

## 2020-04-09 DIAGNOSIS — M25562 Pain in left knee: Secondary | ICD-10-CM | POA: Diagnosis not present

## 2020-04-19 DIAGNOSIS — I071 Rheumatic tricuspid insufficiency: Secondary | ICD-10-CM | POA: Diagnosis not present

## 2020-04-19 DIAGNOSIS — Z8774 Personal history of (corrected) congenital malformations of heart and circulatory system: Secondary | ICD-10-CM | POA: Diagnosis not present

## 2020-04-19 DIAGNOSIS — R262 Difficulty in walking, not elsewhere classified: Secondary | ICD-10-CM | POA: Diagnosis not present

## 2020-04-19 DIAGNOSIS — M25562 Pain in left knee: Secondary | ICD-10-CM | POA: Diagnosis not present

## 2020-04-19 DIAGNOSIS — I700 Atherosclerosis of aorta: Secondary | ICD-10-CM | POA: Diagnosis not present

## 2020-04-19 DIAGNOSIS — I253 Aneurysm of heart: Secondary | ICD-10-CM | POA: Diagnosis not present

## 2020-04-19 DIAGNOSIS — I452 Bifascicular block: Secondary | ICD-10-CM | POA: Diagnosis not present

## 2020-04-19 DIAGNOSIS — Q213 Tetralogy of Fallot: Secondary | ICD-10-CM | POA: Diagnosis not present

## 2020-04-19 DIAGNOSIS — I712 Thoracic aortic aneurysm, without rupture: Secondary | ICD-10-CM | POA: Diagnosis not present

## 2020-04-21 DIAGNOSIS — M25562 Pain in left knee: Secondary | ICD-10-CM | POA: Diagnosis not present

## 2020-04-21 DIAGNOSIS — R262 Difficulty in walking, not elsewhere classified: Secondary | ICD-10-CM | POA: Diagnosis not present

## 2020-04-27 DIAGNOSIS — M25562 Pain in left knee: Secondary | ICD-10-CM | POA: Diagnosis not present

## 2020-04-27 DIAGNOSIS — R262 Difficulty in walking, not elsewhere classified: Secondary | ICD-10-CM | POA: Diagnosis not present

## 2020-04-29 DIAGNOSIS — R262 Difficulty in walking, not elsewhere classified: Secondary | ICD-10-CM | POA: Diagnosis not present

## 2020-04-29 DIAGNOSIS — M25562 Pain in left knee: Secondary | ICD-10-CM | POA: Diagnosis not present

## 2020-05-03 DIAGNOSIS — M25562 Pain in left knee: Secondary | ICD-10-CM | POA: Diagnosis not present

## 2020-05-03 DIAGNOSIS — R262 Difficulty in walking, not elsewhere classified: Secondary | ICD-10-CM | POA: Diagnosis not present

## 2020-05-31 ENCOUNTER — Ambulatory Visit (INDEPENDENT_AMBULATORY_CARE_PROVIDER_SITE_OTHER): Payer: Medicare Other | Admitting: Internal Medicine

## 2020-05-31 ENCOUNTER — Other Ambulatory Visit: Payer: Self-pay

## 2020-05-31 ENCOUNTER — Encounter: Payer: Self-pay | Admitting: Internal Medicine

## 2020-05-31 VITALS — BP 106/80 | HR 76 | Temp 97.0°F | Resp 16 | Ht 72.0 in | Wt 223.2 lb

## 2020-05-31 DIAGNOSIS — E785 Hyperlipidemia, unspecified: Secondary | ICD-10-CM | POA: Diagnosis not present

## 2020-05-31 DIAGNOSIS — R7303 Prediabetes: Secondary | ICD-10-CM

## 2020-05-31 DIAGNOSIS — Z96652 Presence of left artificial knee joint: Secondary | ICD-10-CM | POA: Diagnosis not present

## 2020-05-31 DIAGNOSIS — I712 Thoracic aortic aneurysm, without rupture, unspecified: Secondary | ICD-10-CM

## 2020-05-31 DIAGNOSIS — Z8774 Personal history of (corrected) congenital malformations of heart and circulatory system: Secondary | ICD-10-CM | POA: Diagnosis not present

## 2020-05-31 DIAGNOSIS — G8929 Other chronic pain: Secondary | ICD-10-CM | POA: Insufficient documentation

## 2020-05-31 DIAGNOSIS — M25561 Pain in right knee: Secondary | ICD-10-CM

## 2020-05-31 DIAGNOSIS — I10 Essential (primary) hypertension: Secondary | ICD-10-CM

## 2020-05-31 DIAGNOSIS — J449 Chronic obstructive pulmonary disease, unspecified: Secondary | ICD-10-CM

## 2020-05-31 LAB — LIPID PANEL
Cholesterol: 149 mg/dL (ref 0–200)
HDL: 49.2 mg/dL (ref >39.00)
LDL Cholesterol: 83 mg/dL (ref 0–99)
NonHDL: 99.93
Total CHOL/HDL Ratio: 3
Triglycerides: 85 mg/dL (ref 0.0–149.0)
VLDL: 17 mg/dL (ref 0.0–40.0)

## 2020-05-31 LAB — COMPREHENSIVE METABOLIC PANEL
ALT: 21 U/L (ref 0–53)
AST: 17 U/L (ref 0–37)
Albumin: 4.1 g/dL (ref 3.5–5.2)
Alkaline Phosphatase: 51 U/L (ref 39–117)
BUN: 18 mg/dL (ref 6–23)
CO2: 33 mEq/L — ABNORMAL HIGH (ref 19–32)
Calcium: 9.8 mg/dL (ref 8.4–10.5)
Chloride: 101 mEq/L (ref 96–112)
Creatinine, Ser: 1.05 mg/dL (ref 0.40–1.50)
GFR: 72.74 mL/min (ref >60.00)
Glucose, Bld: 101 mg/dL — ABNORMAL HIGH (ref 70–99)
Potassium: 3.7 mEq/L (ref 3.5–5.1)
Sodium: 142 mEq/L (ref 135–145)
Total Bilirubin: 0.7 mg/dL (ref 0.2–1.2)
Total Protein: 6.5 g/dL (ref 6.0–8.3)

## 2020-05-31 LAB — HEMOGLOBIN A1C: Hgb A1c MFr Bld: 5.9 % (ref 4.6–6.5)

## 2020-05-31 MED ORDER — CELECOXIB 100 MG PO CAPS
100.0000 mg | ORAL_CAPSULE | Freq: Two times a day (BID) | ORAL | 0 refills | Status: DC
Start: 2020-05-31 — End: 2020-06-21

## 2020-05-31 MED ORDER — PREDNISONE 10 MG PO TABS: ORAL_TABLET | ORAL | 0 refills | Status: DC

## 2020-05-31 NOTE — Assessment & Plan Note (Addendum)
S/p same day surgery by  Harden Mo at [facility]

## 2020-05-31 NOTE — Assessment & Plan Note (Signed)
Recent episode of bronchitis was COVID negative but lasted several weeks before resolving .  Prednisone dose was 20 mg daily  X 5 and he felt only mild improvement on that dose. . Patient was given a prednisone taper starting at 60 mg daily for next occurrence.

## 2020-05-31 NOTE — Patient Instructions (Addendum)
Zyrtec  can be taken up to 4 times daily for allergies if needed,   but start with increasing current dose to every 12 hours   celebrex  may be easier on stomach than advil.  Up to 2 times daily. Don't forget that  You can add up to 2000 mg of acetominophen (tylenol) every day safely  In divided doses (500 mg every 6 hours  Or 1000 mg every 12 hours.)    I sent in a prolonged prednisone taper for your next asthma flare (60 mg daily x 3,  Then taper daly)

## 2020-05-31 NOTE — Assessment & Plan Note (Signed)
Managed with atorvastatin 10 mg daily ; ldl < 100 .lfts normal . No changes today   Lab Results  Component Value Date   CHOL 149 05/31/2020   HDL 49.20 05/31/2020   LDLCALC 83 05/31/2020   LDLDIRECT 73.6 11/27/2011   TRIG 85.0 05/31/2020   CHOLHDL 3 05/31/2020   Lab Results  Component Value Date   ALT 21 05/31/2020   AST 17 05/31/2020   ALKPHOS 51 05/31/2020   BILITOT 0.7 05/31/2020

## 2020-05-31 NOTE — Progress Notes (Signed)
Subjective:  Patient ID: Charles Mccann, male    DOB: 02/20/1950  Age: 70 y.o. MRN: [id number]  CC: The primary encounter diagnosis was Prediabetes. Diagnoses of Thoracic aortic aneurysm without rupture ([place]), History of tetralogy of Fallot repair, S/P TKR (total knee replacement) using cement, left, Hyperlipidemia with target LDL less than 100, Asthma-chronic obstructive pulmonary disease overlap syndrome ([place]), Primary hypertension, and Chronic pain of right knee were also pertinent to this visit.  HPI Charles Mccann presents for follow up on chronic issues.   This visit occurred during the SARS-CoV-2 public health emergency.  Safety protocols were in place, including screening questions prior to the visit, additional usage of staff PPE, and extensive cleaning of exam room while observing appropriate contact time as indicated for disinfecting solutions.    Patient has received  three doses of the available COVID 19 vaccine without complications.  Patient continues to mask when outside of the home except when walking in yard or at safe distances from others .  Patient denies any change in mood or development of unhealthy behaviors resuting from the pandemic's restriction of activities and socialization.   Aortic aneurysm:  Blood pressure has been lowered somewhat on current regimen .  He receives an aortic ultrasound annually at [place] or less. .   Bicuspid valve suspected by last TTE.  Scheduled  for TEE  NSAID use:  Using ADvil 3 times daily  400 mg . Takes nexium 40 mg daily   Recovering from a cold caught 5 weeks ago,  COVID negative,  Took weeks to resolve , low dose prednisone used by Dr Raul Del.   Prediabetes:  His  fasting glucose has never been  diagnostic of diabetes; but A1c suggests that  She is  at risk for developing type 2 Diabetes.  He has been  Cutting back on desserts and starches ,  regular participation in a walking program for exercise .     Outpatient Medications Prior to Visit  Medication Sig Dispense Refill  . albuterol (PROVENTIL HFA;VENTOLIN HFA) 108 (90 BASE) MCG/ACT inhaler Inhale 2 puffs into the lungs every 6 (six) hours as needed.    Marland Kitchen atorvastatin (LIPITOR) 10 MG tablet Take 1 tablet (10 mg total) by mouth daily. 90 tablet 3  . azelastine (ASTELIN) 0.1 % nasal spray Place into the nose.    . budesonide-formoterol (SYMBICORT) 160-4.5 MCG/ACT inhaler Inhale 2 puffs into the lungs 2 (two) times daily.    . Cholecalciferol (VITAMIN D) 2000 units CAPS Take 2,000 Units by mouth every morning.     . Coenzyme Q10 (CO Q 10 PO) Take 500 mg by mouth daily.    . fluticasone (FLONASE) 50 MCG/ACT nasal spray Place 2 sprays into both nostrils at bedtime.     . hydrochlorothiazide (HYDRODIURIL) 25 MG tablet Take 1 tablet (25 mg total) by mouth daily. 90 tablet 3  . metoprolol tartrate (LOPRESSOR) 25 MG tablet Take 25 mg by mouth daily.    . montelukast (SINGULAIR) 10 MG tablet Take 10 mg by mouth at bedtime.     . Potassium 99 MG TABS Take 2 tablets by mouth daily.    . predniSONE (DELTASONE) 10 MG tablet Take 5 mg by mouth.     Marland Kitchen omeprazole (PRILOSEC) 40 MG capsule Take by mouth.    Marland Kitchen aspirin EC 81 MG tablet Take by mouth. (Patient not taking: Reported on 05/31/2020)    . Magnesium 200 MG TABS  Take 1 tablet by mouth daily. (Patient not taking: Reported on 05/31/2020)    . meloxicam (MOBIC) 15 MG tablet Take 15 mg by mouth daily. (Patient not taking: Reported on 05/31/2020)     No facility-administered medications prior to visit.    Review of Systems;  Patient denies headache, fevers, malaise, unintentional weight loss, skin rash, eye pain, sinus congestion and sinus pain, sore throat, dysphagia,  hemoptysis , cough, dyspnea, wheezing, chest pain, palpitations, orthopnea, edema, abdominal pain, nausea, melena, diarrhea, constipation, flank pain, dysuria, hematuria, urinary  Frequency, nocturia, numbness, tingling, seizures,  Focal  weakness, Loss of consciousness,  Tremor, insomnia, depression, anxiety, and suicidal ideation.      Objective:  BP 106/80 (BP Location: Left Arm, Patient Position: Sitting, Cuff Size: Large)   Pulse 76   Temp (!) 97 F (36.1 C) (Oral)   Resp 16   Ht 6' (1.829 m)   Wt 223 lb 3.2 oz (101.2 kg)   SpO2 99%   BMI 30.27 kg/m   BP Readings from Last 3 Encounters:  05/31/20 106/80  12/01/19 134/78  06/17/19 126/81    Wt Readings from Last 3 Encounters:  05/31/20 223 lb 3.2 oz (101.2 kg)  12/01/19 226 lb 3.2 oz (102.6 kg)  09/05/19 (!) 225 lb (102.1 kg)    General appearance: alert, cooperative and appears stated age Ears: normal TM's and external ear canals both ears Throat: lips, mucosa, and tongue normal; teeth and gums normal Neck: no adenopathy, no carotid bruit, supple, symmetrical, trachea midline and thyroid not enlarged, symmetric, no tenderness/mass/nodules Back: symmetric, no curvature. ROM normal. No CVA tenderness. Lungs: clear to auscultation bilaterally Heart: regular rate and rhythm, S1, S2 normal, no murmur, click, rub or gallop Abdomen: soft, non-tender; bowel sounds normal; no masses,  no organomegaly Pulses: 2+ and symmetric Skin: Skin color, texture, turgor normal. No rashes or lesions Lymph nodes: Cervical, supraclavicular, and axillary nodes normal.  Lab Results  Component Value Date   HGBA1C 5.9 05/31/2020   HGBA1C 6.3 12/01/2019   HGBA1C 6.1 05/29/2019    Lab Results  Component Value Date   CREATININE 1.05 05/31/2020   CREATININE 1.10 12/01/2019   CREATININE 1.12 05/29/2019    Lab Results  Component Value Date   WBC 7.8 04/23/2017   HGB 14.6 04/23/2017   HCT 42.8 04/23/2017   PLT 226.0 04/23/2017   GLUCOSE 101 (H) 05/31/2020   CHOL 149 05/31/2020   TRIG 85.0 05/31/2020   HDL 49.20 05/31/2020   LDLDIRECT 73.6 11/27/2011   LDLCALC 83 05/31/2020   ALT 21 05/31/2020   AST 17 05/31/2020   NA 142 05/31/2020   K 3.7 05/31/2020   CL 101  05/31/2020   CREATININE 1.05 05/31/2020   BUN 18 05/31/2020   CO2 33 (H) 05/31/2020   TSH 3.72 11/14/2018   PSA 1.10 12/01/2019   HGBA1C 5.9 05/31/2020   MICROALBUR <0.7 05/29/2019    No results found.  Assessment & Plan:   Problem List Items Addressed This Visit      Unprioritized   Aneurysm of thoracic aorta ([place])    His aortic aneurysm is stable by repeat ECHO      Asthma-chronic obstructive pulmonary disease overlap syndrome ([place])    Recent episode of bronchitis was COVID negative but lasted several weeks before resolving .  Prednisone dose was 20 mg daily  X 5 and he felt only mild improvement on that dose. . Patient was given a prednisone taper starting at 60 mg daily  for next occurrence.       Relevant Medications   predniSONE (DELTASONE) 10 MG tablet   Chronic pain of right knee    Aggravated by left knee replacement done recently y.  Wearing a brace and using 400 mg ibuprofen bid.  Trial of celebrex .  Continue PPI       History of tetralogy of Fallot repair   Hyperlipidemia with target LDL less than 100    Managed with atorvastatin 10 mg daily ; ldl < 100 .lfts normal . No changes today   Lab Results  Component Value Date   CHOL 149 05/31/2020   HDL 49.20 05/31/2020   LDLCALC 83 05/31/2020   LDLDIRECT 73.6 11/27/2011   TRIG 85.0 05/31/2020   CHOLHDL 3 05/31/2020   Lab Results  Component Value Date   ALT 21 05/31/2020   AST 17 05/31/2020   ALKPHOS 51 05/31/2020   BILITOT 0.7 05/31/2020         Relevant Orders   Lipid panel (Completed)   Hypertension    Well controlled on current regimen of hctz and metoprolol  Renal function stable, no changes today.  Lab Results  Component Value Date   NA 142 05/31/2020   K 3.7 05/31/2020   CL 101 05/31/2020   CO2 33 (H) 05/31/2020   Lab Results  Component Value Date   CREATININE 1.05 05/31/2020         Prediabetes - Primary    a1c has been lowered to 5.9  With low glycemic index diet.        Relevant Orders   Comprehensive metabolic panel (Completed)   Hemoglobin A1c (Completed)   S/P TKR (total knee replacement) using cement, left    S/p same day surgery by  Harden Mo at [facility]          I have discontinued Charles Mccann "Tripp"'s aspirin EC, meloxicam, and Magnesium. I am also having him start on celecoxib and predniSONE. Additionally, I am having him maintain his fluticasone, albuterol, budesonide-formoterol, montelukast, Vitamin D, metoprolol tartrate, azelastine, omeprazole, predniSONE, atorvastatin, Coenzyme Q10 (CO Q 10 PO), Potassium, and hydrochlorothiazide.  Meds ordered this encounter  Medications  . celecoxib (CELEBREX) 100 MG capsule    Sig: Take 1 capsule (100 mg total) by mouth 2 (two) times daily.    Dispense:  60 capsule    Refill:  0  . predniSONE (DELTASONE) 10 MG tablet    Sig: 6 tablets daily for 3 days, then reduce by 1 tablet daily until gone    Dispense:  33 tablet    Refill:  0    Medications Discontinued During This Encounter  Medication Reason  . aspirin EC 81 MG tablet   . Magnesium 200 MG TABS   . meloxicam (MOBIC) 15 MG tablet     Follow-up: No follow-ups on file.   Crecencio Mc, MD

## 2020-05-31 NOTE — Assessment & Plan Note (Signed)
Aggravated by left knee replacement done recently y.  Wearing a brace and using 400 mg ibuprofen bid.  Trial of celebrex .  Continue PPI

## 2020-05-31 NOTE — Assessment & Plan Note (Signed)
a1c has been lowered to 5.9  With low glycemic index diet.

## 2020-05-31 NOTE — Assessment & Plan Note (Signed)
His aortic aneurysm is stable by repeat ECHO

## 2020-05-31 NOTE — Assessment & Plan Note (Signed)
Well controlled on current regimen of hctz and metoprolol  Renal function stable, no changes today.  Lab Results  Component Value Date   NA 142 05/31/2020   K 3.7 05/31/2020   CL 101 05/31/2020   CO2 33 (H) 05/31/2020   Lab Results  Component Value Date   CREATININE 1.05 05/31/2020

## 2020-06-07 DIAGNOSIS — L821 Other seborrheic keratosis: Secondary | ICD-10-CM | POA: Diagnosis not present

## 2020-06-07 DIAGNOSIS — L570 Actinic keratosis: Secondary | ICD-10-CM | POA: Diagnosis not present

## 2020-06-07 DIAGNOSIS — L814 Other melanin hyperpigmentation: Secondary | ICD-10-CM | POA: Diagnosis not present

## 2020-06-08 DIAGNOSIS — J4540 Moderate persistent asthma, uncomplicated: Secondary | ICD-10-CM | POA: Diagnosis not present

## 2020-06-08 DIAGNOSIS — R0600 Dyspnea, unspecified: Secondary | ICD-10-CM | POA: Diagnosis not present

## 2020-06-21 ENCOUNTER — Other Ambulatory Visit: Payer: Self-pay

## 2020-06-22 MED ORDER — CELECOXIB 100 MG PO CAPS: 100.0000 mg | ORAL_CAPSULE | Freq: Two times a day (BID) | ORAL | 0 refills | Status: DC

## 2020-06-23 ENCOUNTER — Other Ambulatory Visit: Payer: Self-pay | Admitting: Internal Medicine

## 2020-07-14 DIAGNOSIS — H26491 Other secondary cataract, right eye: Secondary | ICD-10-CM | POA: Diagnosis not present

## 2020-07-14 DIAGNOSIS — Z961 Presence of intraocular lens: Secondary | ICD-10-CM | POA: Diagnosis not present

## 2020-07-20 DIAGNOSIS — I712 Thoracic aortic aneurysm, without rupture: Secondary | ICD-10-CM | POA: Diagnosis not present

## 2020-07-20 DIAGNOSIS — I77810 Thoracic aortic ectasia: Secondary | ICD-10-CM | POA: Diagnosis not present

## 2020-07-20 DIAGNOSIS — I083 Combined rheumatic disorders of mitral, aortic and tricuspid valves: Secondary | ICD-10-CM | POA: Diagnosis not present

## 2020-07-20 DIAGNOSIS — Z8774 Personal history of (corrected) congenital malformations of heart and circulatory system: Secondary | ICD-10-CM | POA: Diagnosis not present

## 2020-07-22 DIAGNOSIS — M1711 Unilateral primary osteoarthritis, right knee: Secondary | ICD-10-CM | POA: Diagnosis not present

## 2020-07-22 DIAGNOSIS — M76891 Other specified enthesopathies of right lower limb, excluding foot: Secondary | ICD-10-CM | POA: Diagnosis not present

## 2020-09-07 ENCOUNTER — Ambulatory Visit (INDEPENDENT_AMBULATORY_CARE_PROVIDER_SITE_OTHER): Payer: Medicare Other

## 2020-09-07 VITALS — Ht 72.0 in | Wt 223.0 lb

## 2020-09-07 DIAGNOSIS — Z0000 Encounter for general adult medical examination without abnormal findings: Secondary | ICD-10-CM | POA: Diagnosis not present

## 2020-09-07 NOTE — Progress Notes (Signed)
Subjective:   Charles Mccann is a 70 y.o. male who presents for Medicare Annual/Subsequent preventive examination.  Review of Systems    No ROS.  Medicare Wellness Virtual Visit.  Visual/audio telehealth visit, UTA vital signs.   See social history for additional risk factors.   Cardiac Risk Factors include: advanced age (>11mn, >>62women);male gender     Objective:    Today's Vitals   09/07/20 1117  Weight: 223 lb (101.2 kg)  Height: 6' (1.829 m)   Body mass index is 30.24 kg/m.  Advanced Directives 09/07/2020 09/05/2019 09/05/2019 06/17/2019 09/04/2018 02/21/2017 04/20/2016  Does Patient Have a Medical Advance Directive? Yes Yes Yes Yes Yes Yes Yes  Type of AParamedicof [place]Francest[place]Living will [place]oeland ParkLiving will HN[place]chinaLiving will HCampbell StationLiving will HInaLiving will HBedfordLiving will HAmayaLiving will  Does patient want to make changes to medical advance directive? No - Patient declined No - Patient declined No - Patient declined - No - Patient declined No - Patient declined -  Copy of HProspectin Chart? No - copy requested No - copy requested No - copy requested No - copy requested No - copy requested No - copy requested -    Current Medications (verified) Outpatient Encounter Medications as of 09/07/2020  Medication Sig   albuterol (PROVENTIL HFA;VENTOLIN HFA) 108 (90 BASE) MCG/ACT inhaler Inhale 2 puffs into the lungs every 6 (six) hours as needed.   atorvastatin (LIPITOR) 10 MG tablet Take 1 tablet (10 mg total) by mouth daily.   azelastine (ASTELIN) 0.1 % nasal spray Place into the nose.   budesonide-formoterol (SYMBICORT) 160-4.5 MCG/ACT inhaler Inhale 2 puffs into the lungs 2 (two) times daily.   celecoxib (CELEBREX) 100 MG capsule TAKE 1 CAPSULE BY MOUTH 2 TIMES DAILY   Cholecalciferol (VITAMIN D)  2000 units CAPS Take 2,000 Units by mouth every morning.    Coenzyme Q10 (CO Q 10 PO) Take 500 mg by mouth daily.   fluticasone (FLONASE) 50 MCG/ACT nasal spray Place 2 sprays into both nostrils at bedtime.    hydrochlorothiazide (HYDRODIURIL) 25 MG tablet Take 1 tablet (25 mg total) by mouth daily.   metoprolol tartrate (LOPRESSOR) 25 MG tablet Take 25 mg by mouth daily.   montelukast (SINGULAIR) 10 MG tablet Take 10 mg by mouth at bedtime.    omeprazole (PRILOSEC) 40 MG capsule Take by mouth.   Potassium 99 MG TABS Take 2 tablets by mouth daily.   predniSONE (DELTASONE) 10 MG tablet Take 5 mg by mouth.    predniSONE (DELTASONE) 10 MG tablet 6 tablets daily for 3 days, then reduce by 1 tablet daily until gone   No facility-administered encounter medications on file as of 09/07/2020.    Allergies (verified) Other and Fish allergy   History: Past Medical History:  Diagnosis Date   Arthritis    Asthma    managed by FRaul Del  Bronchitis, chronic obstructive, [place] exacerbation [place] 04/[place]   COPD (chronic obstructive pulmonary disease) (HCC)    GERD (gastroesophageal reflux disease)    controlled only with nexium priro prevacid failutre   Heart murmur    Hypercholesterolemia    Hyperlipidemia with target LDL less than 100 05/28/2011   Hypertension    Impotence due to erectile dysfunction 04/26/2014   Left inguinal hernia 04/07/2016   Obstructive sleep apnea of adult 2012   on CPAP  tolerating ,  12 cm H20 , room air    Palpitations 11/27/2011   Tetralogy of Fallot    Thoracic aortic aneurysm [facility])    Treadmill stress test negative for angina pectoris [id number]   Umbilical hernia without obstruction and without gangrene 03/10/2016   Past Surgical History:  Procedure Laterality Date   CARDIAC CATHETERIZATION     CATARACT EXTRACTION W/PHACO Right 11/16/2014   Procedure: CATARACT EXTRACTION PHACO AND INTRAOCULAR LENS PLACEMENT ([place]);  Surgeon: Estill Cotta, MD;  Location: [facility] ORS;   Service: Ophthalmology;  Laterality: Right;  [id number] H US:01:03.9 AP%:24.3% CDE:30.12   CATARACT EXTRACTION W/PHACO Left 02/21/2017   Procedure: CATARACT EXTRACTION PHACO AND INTRAOCULAR LENS PLACEMENT ([place]) LEFT;  Surgeon: Leandrew Koyanagi, MD;  Location: [facility];  Service: Ophthalmology;  Laterality: Left;  requests later   COLONOSCOPY WITH PROPOFOL N/A 06/17/2019   Procedure: COLONOSCOPY WITH PROPOFOL;  Surgeon: [name], MD;  Location: [facility] ENDOSCOPY;  Service: Endoscopy;  Laterality: N/A;   EYE SURGERY     INGUINAL HERNIA REPAIR Left 04/20/2016   Procedure: HERNIA REPAIR INGUINAL ADULT WITH MESH;  Surgeon: Clayburn Pert, MD;  Location: [facility] ORS;  Service: General;  Laterality: Left;   TETRALOGY OF FALLOT REPAIR  1974   [facility] [place]   TONSILLECTOMY     UMBILICAL HERNIA REPAIR N/A 04/20/2016   Procedure: HERNIA REPAIR UMBILICAL ADULT WITH MESH ;  Surgeon: Clayburn Pert, MD;  Location: [facility] ORS;  Service: General;  Laterality: N/A;   Family History  Problem Relation Age of Onset   Heart disease Father 58       CAD   Cancer Brother 58       liver CA mets to brain    Cancer Brother        colon Ca , stomach CA   Social History   Socioeconomic History   Marital status: Married    Spouse name: Not on file   Number of children: Not on file   Years of education: Not on file   Highest education level: Not on file  Occupational History   Occupation: retired Optometrist     Comment: travels  to [place] weekly   Tobacco Use   Smoking status: Never   Smokeless tobacco: Never  Vaping Use   Vaping Use: Never used  Substance and Sexual Activity   Alcohol use: Yes    Alcohol/week: 14.0 standard drinks    Types: 5 Cans of beer, 4 Shots of liquor, 5 Glasses of wine per week    Comment:     Drug use: No   Sexual activity: Not Currently  Other Topics Concern   Not on file  Social History Narrative   Not on file   Social Determinants of Health   Financial  Resource Strain: Low Risk    Difficulty of Paying Living Expenses: Not hard at all  Food Insecurity: No Food Insecurity   Worried About Charity fundraiser in the Last Year: Never true   Ran Out of Food in the Last Year: Never true  Transportation Needs: No Transportation Needs   Lack of Transportation (Medical): No   Lack of Transportation (Non-Medical): No  Physical Activity: Sufficiently Active   Days of Exercise per Week: 3 days   Minutes of Exercise per Session: 60 min  Stress: No Stress Concern Present   Feeling of Stress : Not at all  Social Connections: Unknown   Frequency of Communication with Friends and Family: Not on file  Frequency of Social Gatherings with Friends and Family: Not on file   Attends Religious Services: Not on file   Active Member of Clubs or Organizations: Not on file   Attends Archivist Meetings: Not on file   Marital Status: Married    Tobacco Counseling Counseling given: Not Answered   Clinical Intake:  Pre-visit preparation completed: Yes        Diabetes: No  How often do you need to have someone help you when you read instructions, pamphlets, or other written materials from your doctor or pharmacy?: 1 - Never   Interpreter Needed?: No      Activities of Daily Living In your present state of health, do you have any difficulty performing the following activities: 09/07/2020  Hearing? N  Vision? N  Difficulty concentrating or making decisions? N  Walking or climbing stairs? Y  Comment Difficulty walking at great distance. Chronic knee pain.  Dressing or bathing? N  Doing errands, shopping? N  Preparing Food and eating ? N  Using the Toilet? N  In the past six months, have you accidently leaked urine? N  Do you have problems with loss of bowel control? N  Managing your Medications? N  Managing your Finances? N  Housekeeping or managing your Housekeeping? N  Some recent data might be hidden    Patient Care  Team: Crecencio Mc, MD as PCP - General (Internal Medicine)  Indicate any recent Medical Services you may have received from other than [facility] providers in the past year (date may be approximate).     Assessment:   This is a routine wellness examination for Charles Mccann.  I connected with Charles Mccann today by telephone and verified that I am speaking with the correct person using two identifiers. Location patient: home Location provider: work Persons participating in the virtual visit: patient, Marine scientist.    I discussed the limitations, risks, security and privacy concerns of performing an evaluation and management service by telephone and the availability of in person appointments. The patient expressed understanding and verbally consented to this telephonic visit.    Interactive audio and video telecommunications were attempted between this provider and patient, however failed, due to patient having technical difficulties OR patient did not have access to video capability.  We continued and completed visit with audio only.  Some vital signs may be absent or patient reported.   Hearing/Vision screen Hearing Screening - Comments:: Patient is able to hear conversational tones without difficulty.  No issues reported. Vision Screening - Comments:: Wears corrective lenses  Cataract extraction, bilateral  They have seen their ophthalmologist in the last 12 months.   Dietary issues and exercise activities discussed: Current Exercise Habits: Home exercise routine, Intensity: Mild Regular diet    Goals Addressed               This Visit's Progress     Patient Stated     Follow up with Primary Care Provider (pt-stated)        Routine scheduled appointments       Depression Screen PHQ 2/9 Scores 09/07/2020 05/31/2020 09/05/2019 09/04/2018 04/23/2017 02/19/2012  PHQ - 2 Score 0 0 0 0 0 0  PHQ- 9 Score - 1 - - 1 -    Fall Risk Fall Risk  09/07/2020 05/31/2020 12/01/2019 09/05/2019 05/29/2019  Falls in the  past year? 0 0 0 0 0  Number falls in past yr: - - - 0 -  Injury with Fall? 0 - - - -  Follow up - Falls evaluation completed Falls evaluation completed Falls evaluation completed Falls evaluation completed    [place]: Adequate lighting in your home to reduce risk of falls? Yes   ASSISTIVE DEVICES UTILIZED TO PREVENT FALLS: Life alert? No  Use of a cane, walker or w/c? No   TIMED UP AND GO: Was the test performed? No .   Cognitive Function:  Patient is alert and oriented x3.  Denies difficulty focusing, making decisions, memory loss.  Enjoys crossword puzzles.  MMSE/6CIT deferred. Normal by direct communication/observation.    6CIT Screen 09/04/2018  What Year? 0 points  What month? 0 points  What time? 0 points  Count back from 20 0 points  Months in reverse 0 points  Repeat phrase 0 points  Total Score 0    Immunizations Immunization History  Administered Date(s) Administered   Fluad Quad(high Dose 65+) 11/14/2018   Influenza Inj Mdck Quad Pf 11/17/2015   Influenza Split 11/27/2011   Influenza-Unspecified 11/25/2012, 11/23/2013, 12/26/2016, 11/20/2017, 11/17/2019   Moderna SARS-COV2 Booster Vaccination 04/01/2020   Moderna Sars-Covid-2 Vaccination 02/25/2019, 03/25/2019, 09/21/2019, 04/01/2020   Pneumococcal Conjugate-13 04/24/2014, 11/20/2017   Pneumococcal Polysaccharide-23 11/27/2011, 12/01/2019   Tdap 01/09/2013   Zoster Recombinat (Shingrix) 12/10/2019, 03/16/2020   Zoster, Live 11/25/2012   Covid vaccine- 4 completed.   Health Maintenance Health Maintenance  Topic Date Due   COVID-19 Vaccine (5 - Booster for Moderna series) 09/23/2020 (Originally 07/30/2020)   INFLUENZA VACCINE  10/24/2020 (Originally 09/06/2020)   TETANUS/TDAP  01/10/2023   COLONOSCOPY (Pts 45-84yr Insurance coverage will need to be confirmed)  06/16/2024   Hepatitis C Screening  Completed   PNA vac Low Risk Adult  Completed   Zoster Vaccines-  Shingrix  Completed   HPV VACCINES  Aged Out   Lung Cancer Screening: (Low Dose CT Chest recommended if Age 69-80years, 30 pack-year currently smoking OR have quit w/in 15years.) does not qualify.   Vision Screening: Recommended annual ophthalmology exams for early detection of glaucoma and other disorders of the eye.  Dental Screening: Recommended annual dental exams for proper oral hygiene  Community Resource Referral / Chronic Care Management: CRR required this visit?  No   CCM required this visit?  No      Plan:     I have personally reviewed and noted the following in the patient's chart:   Medical and social history Use of alcohol, tobacco or illicit drugs  Current medications and supplements including opioid prescriptions. Patient is not currently taking opioid prescriptions. Functional ability and status Nutritional status Physical activity Advanced directives List of other physicians Hospitalizations, surgeries, and ER visits in previous 12 months Vitals Screenings to include cognitive, depression, and falls Referrals and appointments  In addition, I have reviewed and discussed with patient certain preventive protocols, quality metrics, and best practice recommendations. A written personalized care plan for preventive services as well as general preventive health recommendations were provided to patient via mychart.     OVarney Biles LPN   [id number]

## 2020-09-07 NOTE — Patient Instructions (Addendum)
Charles Mccann , Thank you for taking time to come for your Medicare Wellness Visit. I appreciate your ongoing commitment to your health goals. Please review the following plan we discussed and let me know if I can assist you in the future.   These are the goals we discussed:  Goals       Patient Stated     Follow up with Primary Care Provider (pt-stated)      Routine scheduled appointments        This is a list of the screening recommended for you and due dates:  Health Maintenance  Topic Date Due   COVID-19 Vaccine (5 - Booster for Moderna series) 09/23/2020*   Flu Shot  10/24/2020*   Tetanus Vaccine  01/10/2023   Colon Cancer Screening  06/16/2024   Hepatitis C Screening: USPSTF Recommendation to screen - Ages 18-79 yo.  Completed   Pneumonia vaccines  Completed   Zoster (Shingles) Vaccine  Completed   HPV Vaccine  Aged Out  *Topic was postponed. The date shown is not the original due date.    Advanced directives: End of life planning; Advance aging; Advanced directives discussed.  Copy of current HCPOA/Living Will requested.    Conditions/risks identified: none new  Follow up in one year for your annual wellness visit.   Preventive Care 33 Years and Older, Male Preventive care refers to lifestyle choices and visits with your health care provider that can promote health and wellness. What does preventive care include? A yearly physical exam. This is also called an annual well check. Dental exams once or twice a year. Routine eye exams. Ask your health care provider how often you should have your eyes checked. Personal lifestyle choices, including: Daily care of your teeth and gums. Regular physical activity. Eating a healthy diet. Avoiding tobacco and drug use. Limiting alcohol use. Practicing safe sex. Taking low doses of aspirin every day. Taking vitamin and mineral supplements as recommended by your health care provider. What happens during an annual well  check? The services and screenings done by your health care provider during your annual well check will depend on your age, overall health, lifestyle risk factors, and family history of disease. Counseling  Your health care provider may ask you questions about your: Alcohol use. Tobacco use. Drug use. Emotional well-being. Home and relationship well-being. Sexual activity. Eating habits. History of falls. Memory and ability to understand (cognition). Work and work Statistician. Screening  You may have the following tests or measurements: Height, weight, and BMI. Blood pressure. Lipid and cholesterol levels. These may be checked every 5 years, or more frequently if you are over 68 years old. Skin check. Lung cancer screening. You may have this screening every year starting at age 14 if you have a 30-pack-year history of smoking and currently smoke or have quit within the past 15 years. Fecal occult blood test (FOBT) of the stool. You may have this test every year starting at age 7. Flexible sigmoidoscopy or colonoscopy. You may have a sigmoidoscopy every 5 years or a colonoscopy every 10 years starting at age 45. Prostate cancer screening. Recommendations will vary depending on your family history and other risks. Hepatitis C blood test. Hepatitis B blood test. Sexually transmitted disease (STD) testing. Diabetes screening. This is done by checking your blood sugar (glucose) after you have not eaten for a while (fasting). You may have this done every 1-3 years. Abdominal aortic aneurysm (AAA) screening. You may need this if you are a  current or former smoker. Osteoporosis. You may be screened starting at age 67 if you are at high risk. Talk with your health care provider about your test results, treatment options, and if necessary, the need for more tests. Vaccines  Your health care provider may recommend certain vaccines, such as: Influenza vaccine. This is recommended every  year. Tetanus, diphtheria, and acellular pertussis (Tdap, Td) vaccine. You may need a Td booster every 10 years. Zoster vaccine. You may need this after age 55. Pneumococcal 13-valent conjugate (PCV13) vaccine. One dose is recommended after age 63. Pneumococcal polysaccharide (PPSV23) vaccine. One dose is recommended after age 12. Talk to your health care provider about which screenings and vaccines you need and how often you need them. This information is not intended to replace advice given to you by your health care provider. Make sure you discuss any questions you have with your health care provider. Document Released: 02/19/2015 Document Revised: 10/13/2015 Document Reviewed: 11/24/2014 Elsevier Interactive Patient Education  2017 [place] Prevention in the Home Falls can cause injuries. They can happen to people of all ages. There are many things you can do to make your home safe and to help prevent falls. What can I do on the outside of my home? Regularly fix the edges of walkways and driveways and fix any cracks. Remove anything that might make you trip as you walk through a door, such as a raised step or threshold. Trim any bushes or trees on the path to your home. Use bright outdoor lighting. Clear any walking paths of anything that might make someone trip, such as rocks or tools. Regularly check to see if handrails are loose or broken. Make sure that both sides of any steps have handrails. Any raised decks and porches should have guardrails on the edges. Have any leaves, snow, or ice cleared regularly. Use sand or salt on walking paths during winter. Clean up any spills in your garage right away. This includes oil or grease spills. What can I do in the bathroom? Use night lights. Install grab bars by the toilet and in the tub and shower. Do not use towel bars as grab bars. Use non-skid mats or decals in the tub or shower. If you need to sit down in the shower, use a  plastic, non-slip stool. Keep the floor dry. Clean up any water that spills on the floor as soon as it happens. Remove soap buildup in the tub or shower regularly. Attach bath mats securely with double-sided non-slip rug tape. Do not have throw rugs and other things on the floor that can make you trip. What can I do in the bedroom? Use night lights. Make sure that you have a light by your bed that is easy to reach. Do not use any sheets or blankets that are too big for your bed. They should not hang down onto the floor. Have a firm chair that has side arms. You can use this for support while you get dressed. Do not have throw rugs and other things on the floor that can make you trip. What can I do in the kitchen? Clean up any spills right away. Avoid walking on wet floors. Keep items that you use a lot in easy-to-reach places. If you need to reach something above you, use a strong step stool that has a grab bar. Keep electrical cords out of the way. Do not use floor polish or wax that makes floors slippery. If you must use  wax, use non-skid floor wax. Do not have throw rugs and other things on the floor that can make you trip. What can I do with my stairs? Do not leave any items on the stairs. Make sure that there are handrails on both sides of the stairs and use them. Fix handrails that are broken or loose. Make sure that handrails are as long as the stairways. Check any carpeting to make sure that it is firmly attached to the stairs. Fix any carpet that is loose or worn. Avoid having throw rugs at the top or bottom of the stairs. If you do have throw rugs, attach them to the floor with carpet tape. Make sure that you have a light switch at the top of the stairs and the bottom of the stairs. If you do not have them, ask someone to add them for you. What else can I do to help prevent falls? Wear shoes that: Do not have high heels. Have rubber bottoms. Are comfortable and fit you  well. Are closed at the toe. Do not wear sandals. If you use a stepladder: Make sure that it is fully opened. Do not climb a closed stepladder. Make sure that both sides of the stepladder are locked into place. Ask someone to hold it for you, if possible. Clearly mark and make sure that you can see: Any grab bars or handrails. First and last steps. Where the edge of each step is. Use tools that help you move around (mobility aids) if they are needed. These include: Canes. Walkers. Scooters. Crutches. Turn on the lights when you go into a dark area. Replace any light bulbs as soon as they burn out. Set up your furniture so you have a clear path. Avoid moving your furniture around. If any of your floors are uneven, fix them. If there are any pets around you, be aware of where they are. Review your medicines with your doctor. Some medicines can make you feel dizzy. This can increase your chance of falling. Ask your doctor what other things that you can do to help prevent falls. This information is not intended to replace advice given to you by your health care provider. Make sure you discuss any questions you have with your health care provider. Document Released: 11/19/2008 Document Revised: 07/01/2015 Document Reviewed: 02/27/2014 Elsevier Interactive Patient Education  2017 [employer].

## 2020-09-28 ENCOUNTER — Telehealth: Payer: Self-pay | Admitting: Internal Medicine

## 2020-09-28 ENCOUNTER — Telehealth: Payer: Self-pay

## 2020-09-28 DIAGNOSIS — U071 COVID-19: Secondary | ICD-10-CM | POA: Insufficient documentation

## 2020-09-28 MED ORDER — MOLNUPIRAVIR EUA 200MG CAPSULE: 4.0000 | ORAL_CAPSULE | Freq: Two times a day (BID) | ORAL | 0 refills | Status: AC

## 2020-09-28 NOTE — Telephone Encounter (Signed)
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 Can you please call in prescription to [facility] for Paxlovid and/or any other drugs that you think I may need. I'm concerned about how Covid may affect me given my asthma problems. Charles Mccann  Pt tested positive this afternoon. Symptoms are congestion stuffy nose, foggy brain, dry cough. No fever, no abnormal SOBr.  Symptoms started yesterday.

## 2020-09-28 NOTE — Addendum Note (Signed)
Addended by: Crecencio Mc on: 09/28/2020 04:49 PM   Modules accepted: Orders

## 2020-09-28 NOTE — Telephone Encounter (Signed)
PT called to advise that he would like to be prescribed paxlovid or anything for testing positive for covid today. PT would like to get it asap to start taking. Please advise.

## 2020-09-28 NOTE — Telephone Encounter (Signed)
Sent to Dr. Derrel Nip as a telephone encounter

## 2020-10-18 ENCOUNTER — Other Ambulatory Visit: Payer: Self-pay | Admitting: Internal Medicine

## 2020-10-18 ENCOUNTER — Telehealth: Payer: Self-pay | Admitting: Internal Medicine

## 2020-10-18 NOTE — Telephone Encounter (Signed)
Placed in red folder  

## 2020-10-18 NOTE — Telephone Encounter (Signed)
Pt came into office to drop off pre-operative risk assessment for. Placed in provider box. Up front in [employer]

## 2020-10-19 DIAGNOSIS — M1711 Unilateral primary osteoarthritis, right knee: Secondary | ICD-10-CM | POA: Diagnosis not present

## 2020-10-19 NOTE — Telephone Encounter (Signed)
Per note in chart pt sees a cardiologist in [place] at [facility] and the cardiac clearance form has already been faxed to them as of yesterday.

## 2020-10-20 NOTE — Telephone Encounter (Signed)
From has been faxed.

## 2020-11-16 DIAGNOSIS — Z01818 Encounter for other preprocedural examination: Secondary | ICD-10-CM | POA: Diagnosis not present

## 2020-11-16 DIAGNOSIS — J4520 Mild intermittent asthma, uncomplicated: Secondary | ICD-10-CM | POA: Diagnosis not present

## 2020-11-17 DIAGNOSIS — J4520 Mild intermittent asthma, uncomplicated: Secondary | ICD-10-CM | POA: Diagnosis not present

## 2020-11-17 DIAGNOSIS — Z01818 Encounter for other preprocedural examination: Secondary | ICD-10-CM | POA: Diagnosis not present

## 2020-11-17 DIAGNOSIS — R0609 Other forms of dyspnea: Secondary | ICD-10-CM | POA: Diagnosis not present

## 2020-11-26 ENCOUNTER — Other Ambulatory Visit: Payer: Self-pay | Admitting: Internal Medicine

## 2020-12-01 ENCOUNTER — Ambulatory Visit (INDEPENDENT_AMBULATORY_CARE_PROVIDER_SITE_OTHER): Payer: Medicare Other | Admitting: Internal Medicine

## 2020-12-01 ENCOUNTER — Other Ambulatory Visit: Payer: Self-pay

## 2020-12-01 ENCOUNTER — Encounter: Payer: Self-pay | Admitting: Internal Medicine

## 2020-12-01 VITALS — BP 118/83 | HR 77 | Temp 96.9°F | Ht 72.0 in | Wt 227.2 lb

## 2020-12-01 DIAGNOSIS — Z79899 Other long term (current) drug therapy: Secondary | ICD-10-CM

## 2020-12-01 DIAGNOSIS — Z125 Encounter for screening for malignant neoplasm of prostate: Secondary | ICD-10-CM | POA: Diagnosis not present

## 2020-12-01 DIAGNOSIS — D123 Benign neoplasm of transverse colon: Secondary | ICD-10-CM

## 2020-12-01 DIAGNOSIS — Z0000 Encounter for general adult medical examination without abnormal findings: Secondary | ICD-10-CM | POA: Diagnosis not present

## 2020-12-01 DIAGNOSIS — J449 Chronic obstructive pulmonary disease, unspecified: Secondary | ICD-10-CM

## 2020-12-01 DIAGNOSIS — I10 Essential (primary) hypertension: Secondary | ICD-10-CM | POA: Diagnosis not present

## 2020-12-01 DIAGNOSIS — R7303 Prediabetes: Secondary | ICD-10-CM | POA: Diagnosis not present

## 2020-12-01 DIAGNOSIS — E785 Hyperlipidemia, unspecified: Secondary | ICD-10-CM

## 2020-12-01 DIAGNOSIS — J4489 Other specified chronic obstructive pulmonary disease: Secondary | ICD-10-CM

## 2020-12-01 DIAGNOSIS — G4733 Obstructive sleep apnea (adult) (pediatric): Secondary | ICD-10-CM

## 2020-12-01 LAB — COMPREHENSIVE METABOLIC PANEL
ALT: 31 U/L (ref 0–53)
AST: 25 U/L (ref 0–37)
Albumin: 4.5 g/dL (ref 3.5–5.2)
Alkaline Phosphatase: 42 U/L (ref 39–117)
BUN: 23 mg/dL (ref 6–23)
CO2: 32 mEq/L (ref 19–32)
Calcium: 9.8 mg/dL (ref 8.4–10.5)
Chloride: 100 mEq/L (ref 96–112)
Creatinine, Ser: 1.12 mg/dL (ref 0.40–1.50)
GFR: 67.08 mL/min (ref >60.00)
Glucose, Bld: 93 mg/dL (ref 70–99)
Potassium: 3.7 mEq/L (ref 3.5–5.1)
Sodium: 141 mEq/L (ref 135–145)
Total Bilirubin: 1.4 mg/dL — ABNORMAL HIGH (ref 0.2–1.2)
Total Protein: 6.7 g/dL (ref 6.0–8.3)

## 2020-12-01 LAB — HEMOGLOBIN A1C: Hgb A1c MFr Bld: 6.2 % (ref 4.6–6.5)

## 2020-12-01 LAB — CBC WITH DIFFERENTIAL/PLATELET
Basophils Absolute: 0.1 10*3/uL (ref 0.0–0.1)
Basophils Relative: 1.2 % (ref 0.0–3.0)
Eosinophils Absolute: 0.2 10*3/uL (ref 0.0–0.7)
Eosinophils Relative: 3.1 % (ref 0.0–5.0)
HCT: 45.6 % (ref 39.0–52.0)
Hemoglobin: 15.1 g/dL (ref 13.0–17.0)
Lymphocytes Relative: 31.7 % (ref 12.0–46.0)
Lymphs Abs: 2.5 10*3/uL (ref 0.7–4.0)
MCHC: 33 g/dL (ref 30.0–36.0)
MCV: 98.9 fl (ref 78.0–100.0)
Monocytes Absolute: 0.9 10*3/uL (ref 0.1–1.0)
Monocytes Relative: 11.3 % (ref 3.0–12.0)
Neutro Abs: 4.1 10*3/uL (ref 1.4–7.7)
Neutrophils Relative %: 52.7 % (ref 43.0–77.0)
Platelets: 226 10*3/uL (ref 150.0–400.0)
RBC: 4.61 Mil/uL (ref 4.22–5.81)
RDW: 13.5 % (ref 11.5–15.5)
WBC: 7.8 10*3/uL (ref 4.0–10.5)

## 2020-12-01 LAB — LIPID PANEL
Cholesterol: 180 mg/dL (ref 0–200)
HDL: 52 mg/dL (ref >39.00)
LDL Cholesterol: 106 mg/dL — ABNORMAL HIGH (ref 0–99)
NonHDL: 127.96
Total CHOL/HDL Ratio: 3
Triglycerides: 112 mg/dL (ref 0.0–149.0)
VLDL: 22.4 mg/dL (ref 0.0–40.0)

## 2020-12-01 LAB — MICROALBUMIN / CREATININE URINE RATIO
Creatinine,U: 115.3 mg/dL
Microalb Creat Ratio: 0.6 mg/g (ref 0.0–30.0)
Microalb, Ur: <0.7 mg/dL (ref 0.0–1.9)

## 2020-12-01 LAB — TSH: TSH: 2.36 u[IU]/mL (ref 0.35–5.50)

## 2020-12-01 LAB — PSA, MEDICARE: PSA: 1.3 ng/ml (ref 0.10–4.00)

## 2020-12-01 MED ORDER — TRAMADOL HCL 50 MG PO TABS: 50.0000 mg | ORAL_TABLET | Freq: Four times a day (QID) | ORAL | 0 refills | Status: AC | PRN

## 2020-12-01 NOTE — Patient Instructions (Addendum)
I recommend using tramadol to reduce your celebrex use to once daily if possible, because it may be raising your blood pressure  Better pain control will also  allow you to get back to strengthening your right thigh muscles PRE SURGERY  The first prescription is for 30 tablets . You do not have to take it every 6 hours if you do not need it.  Let me know when you need a refill and I will refill for one month based on your report   The blood pressure should be < 130/80. Let me know in a week if home readings are at goal

## 2020-12-03 MED ORDER — ATORVASTATIN CALCIUM 20 MG PO TABS: 20.0000 mg | ORAL_TABLET | Freq: Every day | ORAL | 1 refills | Status: DC

## 2020-12-04 NOTE — Assessment & Plan Note (Addendum)
age appropriate education and counseling updated, referrals for preventative services and immunizations addressed, dietary and smoking counseling addressed, most recent labs reviewed.  I have personally reviewed and have noted:  1) the patient's medical and social history 2) The pt's use of alcohol, tobacco, and illicit drugs 3) The patient's current medications and supplements 4) Functional ability including ADL's, fall risk, home safety risk, hearing and visual impairment 5) Diet and physical activities 6) Evidence for depression or mood disorder 7) The patient's height, weight, and BMI have been recorded in the chart  I have made referrals, and provided counseling and education based on review of the above  Lab Results  Component Value Date   PSA 1.30 12/01/2020   PSA 1.10 12/01/2019   PSA 1.24 11/14/2018

## 2020-12-04 NOTE — Assessment & Plan Note (Addendum)
he reports compliance with medication regimen  but has an elevated reading today in office.  he is  using NSAIDs daily.  Discussed goal of 120/70  (130/80 for patients over 70)  to preserve renal function.  His most recent home reading is 118/83.  He has been advised to reduce celebrex to once daily at most,  Use tylenol and tramadol  instead,  Recheck his   BP  at home and  submit readings for evaluation. Renal function, electrolytes and screen for proteinuria are all normal   Lab Results  Component Value Date   MICROALBUR <0.7 12/01/2020   MICROALBUR <0.7 05/29/2019     Lab Results  Component Value Date   CREATININE 1.12 12/01/2020   Lab Results  Component Value Date   NA 141 12/01/2020   K 3.7 12/01/2020   CL 100 12/01/2020   CO2 32 12/01/2020

## 2020-12-04 NOTE — Progress Notes (Addendum)
Patient ID: Charles Mccann, male    DOB: March 24, 1950  Age: 70 y.o. MRN: [id number]  The patient is here for annual preventive examination and management of other chronic and acute problems.  This visit occurred during the SARS-CoV-2 public health emergency.  Safety protocols were in place, including screening questions prior to the visit, additional usage of staff PPE, and extensive cleaning of exam room while observing appropriate contact time as indicated for disinfecting solutions.  The risk factors are reflected in the social history.  The roster of all physicians providing medical care to patient - is listed in the Snapshot section of the chart.  Activities of daily living:  The patient is 100% independent in all ADLs: dressing, toileting, feeding as well as independent mobility  Home safety : The patient has smoke detectors in the home. They wear seatbelts.  There are no firearms at home. There is no violence in the home.   There is no risks for hepatitis, STDs or HIV. There is no   history of blood transfusion. They have no travel history to infectious disease endemic areas of the world.  The patient has seen their dentist in the last six month. They have seen their eye doctor in the last year. They admit to slight hearing difficulty with regard to whispered voices and some television programs.  They have deferred audiologic testing in the last year.  They do not  have excessive sun exposure. Discussed the need for sun protection: hats, long sleeves and use of sunscreen if there is significant sun exposure.   Diet: the importance of a healthy diet is discussed. They do have a healthy diet.  The benefits of regular aerobic exercise were discussed. She walks 4 times per week ,  20 minutes.   Depression screen: there are no signs or vegative symptoms of depression- irritability, change in appetite, anhedonia, sadness/tearfullness.  Cognitive assessment: the patient manages all their  financial and personal affairs and is actively engaged. They could relate day,date,year and events; recalled 2/3 objects at 3 minutes; performed clock-face test normally.  The following portions of the patient's history were reviewed and updated as appropriate: allergies, current medications, past family history, past medical history,  past surgical history, past social history  and problem list.  Visual acuity was not assessed per patient preference since she has regular follow up with her ophthalmologist. Hearing and body mass index were assessed and reviewed.   During the course of the visit the patient was educated and counseled about appropriate screening and preventive services including : fall prevention , diabetes screening, nutrition counseling, colorectal cancer screening, and recommended immunizations.    CC: The primary encounter diagnosis was Encounter for preventive health examination. Diagnoses of Primary hypertension, Hyperlipidemia with target LDL less than 100, Prediabetes, Prostate cancer screening, Long-term use of high-risk medication, Asthma-chronic obstructive pulmonary disease overlap syndrome ([place]), Obstructive sleep apnea of adult, and Adenomatous polyp of transverse colon were also pertinent to this visit.   Hypertension: patient checks blood pressure twice weekly at home.  Readings have been for the most part > 140/80 at rest . Patient is following a reduce salt diet most days and is taking medications as prescribed . Charles Mccann is taking celebrex bid for knee pain.   Hyperlipidemia:  taking atorvastatin 10 mg daily   Obesity:  weight gain due to altered activitiy and diet  notes lack  of exercise and  chronic  right knee pain . TKR planned in the near future.  History Charles Mccann has a past medical history of Arthritis, Asthma, Bronchitis, chronic obstructive, with exacerbation ([place]) (04/26/2014), COPD (chronic obstructive pulmonary disease) ([place]), GERD (gastroesophageal reflux  disease), Heart murmur, Hypercholesterolemia, Hyperlipidemia with target LDL less than 100 (05/28/2011), Hypertension, Impotence due to erectile dysfunction (04/26/2014), Left inguinal hernia (04/07/2016), Obstructive sleep apnea of adult (2012), Palpitations (11/27/2011), Tetralogy of Fallot, Thoracic aortic aneurysm, Treadmill stress test negative for angina pectoris (2376), and Umbilical hernia without obstruction and without gangrene (03/10/2016).   Charles Mccann has a past surgical history that includes Tetralogy of Fallot repair [phone]); Cataract extraction w/PHACO (Right, 11/16/2014); Eye surgery; Tonsillectomy; Cardiac catheterization; Umbilical hernia repair (N/A, 04/20/2016); Inguinal hernia repair (Left, 04/20/2016); Cataract extraction w/PHACO (Left, 02/21/2017); and Colonoscopy with propofol (N/A, 06/17/2019).   His family history includes Cancer in his brother; Cancer (age of onset: 80) in his brother; Heart disease (age of onset: 37) in his father.Charles Mccann reports that Charles Mccann has never smoked. Charles Mccann has never used smokeless tobacco. Charles Mccann reports current alcohol use of about 14.0 standard drinks per week. Charles Mccann reports that Charles Mccann does not use drugs.  Outpatient Medications Prior to Visit  Medication Sig Dispense Refill   albuterol (PROVENTIL HFA;VENTOLIN HFA) 108 (90 BASE) MCG/ACT inhaler Inhale 2 puffs into the lungs every 6 (six) hours as needed.     azelastine (ASTELIN) 0.1 % nasal spray Place into the nose.     budesonide-formoterol (SYMBICORT) 160-4.5 MCG/ACT inhaler Inhale 2 puffs into the lungs 2 (two) times daily.     Cholecalciferol (VITAMIN D) 2000 units CAPS Take 2,000 Units by mouth every morning.      Coenzyme Q10 (CO Q 10 PO) Take 500 mg by mouth daily.     fluticasone (FLONASE) 50 MCG/ACT nasal spray Place 2 sprays into both nostrils at bedtime.      hydrochlorothiazide (HYDRODIURIL) 25 MG tablet TAKE ONE TABLET BY MOUTH EVERY DAY 90 tablet 3   metoprolol succinate (TOPROL-XL) 25 MG 24 hr tablet Take 25 mg by mouth  daily.     montelukast (SINGULAIR) 10 MG tablet Take 10 mg by mouth at bedtime.      Potassium 99 MG TABS Take 2 tablets by mouth daily.     predniSONE (DELTASONE) 10 MG tablet Take 5 mg by mouth.      atorvastatin (LIPITOR) 10 MG tablet TAKE ONE TABLET BY MOUTH EVERY DAY 90 tablet 3   celecoxib (CELEBREX) 200 MG capsule Take 200 mg by mouth 2 (two) times daily as needed.     omeprazole (PRILOSEC) 40 MG capsule Take by mouth.     celecoxib (CELEBREX) 100 MG capsule TAKE 1 CAPSULE BY MOUTH 2 TIMES DAILY 60 capsule 0   metoprolol tartrate (LOPRESSOR) 25 MG tablet Take 25 mg by mouth daily. (Patient not taking: Reported on 12/01/2020)     predniSONE (DELTASONE) 10 MG tablet 6 tablets daily for 3 days, then reduce by 1 tablet daily until gone (Patient not taking: Reported on 12/01/2020) 33 tablet 0   No facility-administered medications prior to visit.    Review of Systems  Patient denies headache, fevers, malaise, unintentional weight loss, skin rash, eye pain, sinus congestion and sinus pain, sore throat, dysphagia,  hemoptysis , cough, dyspnea, wheezing, chest pain, palpitations, orthopnea, edema, abdominal pain, nausea, melena, diarrhea, constipation, flank pain, dysuria, hematuria, urinary  Frequency, nocturia, numbness, tingling, seizures,  Focal weakness, Loss of consciousness,  Tremor, insomnia, depression, anxiety, and suicidal ideation.     Objective:  BP 118/83 (BP Location: Left Arm, Patient Position:  Sitting, Cuff Size: Large)   Pulse 77   Temp (!) 96.9 F (36.1 C) (Temporal)   Ht 6' (1.829 m)   Wt 227 lb 3.2 oz (103.1 kg)   SpO2 99%   BMI 30.81 kg/m   Physical Exam   General appearance: alert, cooperative and appears stated age Ears: normal TM's and external ear canals both ears Throat: lips, mucosa, and tongue normal; teeth and gums normal Neck: no adenopathy, no carotid bruit, supple, symmetrical, trachea midline and thyroid not enlarged, symmetric, no  tenderness/mass/nodules Back: symmetric, no curvature. ROM normal. No CVA tenderness. Lungs: clear to auscultation bilaterally Heart: regular rate and rhythm, S1, S2 normal, no murmur, click, rub or gallop Abdomen: soft, non-tender; bowel sounds normal; no masses,  no organomegaly Pulses: 2+ and symmetric Skin: Skin color, texture, turgor normal. No rashes or lesions Lymph nodes: Cervical, supraclavicular, and axillary nodes normal.   Assessment & Plan:   Problem List Items Addressed This Visit     Asthma-chronic obstructive pulmonary disease overlap syndrome ([place])    Managed by local pulmonology. PFTs were repeated this year. Charles Mccann is asymptomatic at rest and is taking meds as directed .  Still taking 5 mg prednisone daily.  Last follow up was Oct 11 for pre operative clearance for right knee surgery:  Mild intermittent asthma, tolerated left knee replacement. For right knee replacement. pulm wise cleared  Continue same asthma meds ( symbicort 160/4.5 TWO puffs bid, singulair 10 mg q day, prn albuterol), restriction on spiro due to weight and chest surgery.  Prednisone 5 mg q day Mucinex, flonase, zyrtec, nasal saline washes, astelin        Obstructive sleep apnea of adult    Managed with CPAP .  Supplies ordered by Pulmonology      Hyperlipidemia with target LDL less than 100    Increase atorvastatin to 20 mg daily for LDL > 100  Lab Results  Component Value Date   CHOL 180 12/01/2020   HDL 52.00 12/01/2020   LDLCALC 106 (H) 12/01/2020   LDLDIRECT 73.6 11/27/2011   TRIG 112.0 12/01/2020   CHOLHDL 3 12/01/2020         Relevant Medications   metoprolol succinate (TOPROL-XL) 25 MG 24 hr tablet   Other Relevant Orders   Lipid Profile (Completed)   Encounter for preventive health examination - Primary    age appropriate education and counseling updated, referrals for preventative services and immunizations addressed, dietary and smoking counseling addressed, most recent labs  reviewed.  I have personally reviewed and have noted:   1) the patient's medical and social history 2) The pt's use of alcohol, tobacco, and illicit drugs 3) The patient's current medications and supplements 4) Functional ability including ADL's, fall risk, home safety risk, hearing and visual impairment 5) Diet and physical activities 6) Evidence for depression or mood disorder 7) The patient's height, weight, and BMI have been recorded in the chart   I have made referrals, and provided counseling and education based on review of the above  Lab Results  Component Value Date   PSA 1.30 12/01/2020   PSA 1.10 12/01/2019   PSA 1.24 11/14/2018          Hypertension    Charles Mccann reports compliance with medication regimen  but has an elevated reading today in office.  Charles Mccann is  using NSAIDs daily.  Discussed goal of 120/70  (130/80 for patients over 70)  to preserve renal function.  His most recent home reading is  118/83.  Charles Mccann has been advised to reduce celebrex to once daily at most,  Use tylenol and tramadol  instead,  Recheck his   BP  at home and  submit readings for evaluation. Renal function, electrolytes and screen for proteinuria are all normal   Lab Results  Component Value Date   MICROALBUR <0.7 12/01/2020   MICROALBUR <0.7 05/29/2019     Lab Results  Component Value Date   CREATININE 1.12 12/01/2020   Lab Results  Component Value Date   NA 141 12/01/2020   K 3.7 12/01/2020   CL 100 12/01/2020   CO2 32 12/01/2020         Relevant Medications   metoprolol succinate (TOPROL-XL) 25 MG 24 hr tablet   Other Relevant Orders   Comprehensive metabolic panel (Completed)   Microalbumin / creatinine urine ratio (Completed)   Prediabetes    a1c has risen to 6.2 due to weight gain and lack of exercise. Charles Mccann has been advised of results and feels confident Charles Mccann can reduce again nce Charles Mccann has his right knee replacement behind him    Lab Results  Component Value Date   HGBA1C 6.2 12/01/2020          Relevant Orders   Hemoglobin A1c (Completed)   Comprehensive metabolic panel (Completed)   Polyp of transverse colon    Last colonoscopy reviewed.  5 yr follow up planned in 2026       Other Visit Diagnoses     Prostate cancer screening       Relevant Orders   PSA, Medicare (Completed)   Long-term use of high-risk medication       Relevant Orders   CBC with Differential/Platelet (Completed)   TSH (Completed)      Meds ordered this encounter  Medications   traMADol (ULTRAM) 50 MG tablet    Sig: Take 1 tablet (50 mg total) by mouth every 6 (six) hours as needed for up to 7 days.    Dispense:  30 tablet    Refill:  0    Medications Discontinued During This Encounter  Medication Reason   celecoxib (CELEBREX) 100 MG capsule    metoprolol tartrate (LOPRESSOR) 25 MG tablet    predniSONE (DELTASONE) 10 MG tablet Completed Course    Follow-up: Return in about 6 months (around 06/01/2021).   Crecencio Mc, MD

## 2020-12-04 NOTE — Assessment & Plan Note (Signed)
Increase atorvastatin to 20 mg daily for LDL > 100  Lab Results  Component Value Date   CHOL 180 12/01/2020   HDL 52.00 12/01/2020   LDLCALC 106 (H) 12/01/2020   LDLDIRECT 73.6 11/27/2011   TRIG 112.0 12/01/2020   CHOLHDL 3 12/01/2020

## 2020-12-04 NOTE — Assessment & Plan Note (Signed)
Last colonoscopy reviewed.  5 yr follow up planned in 2026

## 2020-12-04 NOTE — Assessment & Plan Note (Signed)
Managed with CPAP .  Supplies ordered by Pulmonology

## 2020-12-04 NOTE — Assessment & Plan Note (Addendum)
Managed by local pulmonology. PFTs were repeated this year. He is asymptomatic at rest and is taking meds as directed .  Still taking 5 mg prednisone daily.  Last follow up was Oct 11 for pre operative clearance for right knee surgery:  Mild intermittent asthma,tolerated left knee replacement. For right knee replacement. pulm wise cleared  Continue same asthma meds( symbicort 160/4.5 TWO puffs bid, singulair 10 mg q day, prn albuterol), restriction on spiro due to weight and chest surgery.  Prednisone5mg  q day Mucinex, flonase, zyrtec, nasal saline washes, astelin

## 2020-12-04 NOTE — Assessment & Plan Note (Signed)
a1c has risen to 6.2 due to weight gain and lack of exercise. He has been advised of results and feels confident he can reduce again nce he has his right knee replacement behind him    Lab Results  Component Value Date   HGBA1C 6.2 12/01/2020

## 2020-12-20 DIAGNOSIS — L820 Inflamed seborrheic keratosis: Secondary | ICD-10-CM | POA: Diagnosis not present

## 2020-12-20 DIAGNOSIS — D485 Neoplasm of uncertain behavior of skin: Secondary | ICD-10-CM | POA: Diagnosis not present

## 2020-12-20 DIAGNOSIS — D045 Carcinoma in situ of skin of trunk: Secondary | ICD-10-CM | POA: Diagnosis not present

## 2020-12-20 DIAGNOSIS — L570 Actinic keratosis: Secondary | ICD-10-CM | POA: Diagnosis not present

## 2020-12-20 DIAGNOSIS — L821 Other seborrheic keratosis: Secondary | ICD-10-CM | POA: Diagnosis not present

## 2021-01-03 ENCOUNTER — Encounter: Payer: Self-pay | Admitting: Internal Medicine

## 2021-01-04 ENCOUNTER — Encounter: Payer: Self-pay | Admitting: Internal Medicine

## 2021-01-13 DIAGNOSIS — M1711 Unilateral primary osteoarthritis, right knee: Secondary | ICD-10-CM | POA: Diagnosis not present

## 2021-01-19 ENCOUNTER — Encounter: Payer: Self-pay | Admitting: Internal Medicine

## 2021-01-19 NOTE — Telephone Encounter (Addendum)
Patient seen 12/01/20 given tramadol 50 mg 30 no refills for knee pain requesting refill stating medication is helping knee pain. The tramadol is listed in your note from 12/01/20 but I do not see in med list to pen. So I added n case you want to refill>

## 2021-01-20 MED ORDER — TRAMADOL HCL 50 MG PO TABS: 50.0000 mg | ORAL_TABLET | Freq: Every day | ORAL | 5 refills | Status: AC | PRN

## 2021-01-27 DIAGNOSIS — L820 Inflamed seborrheic keratosis: Secondary | ICD-10-CM | POA: Diagnosis not present

## 2021-01-27 DIAGNOSIS — D045 Carcinoma in situ of skin of trunk: Secondary | ICD-10-CM | POA: Diagnosis not present

## 2021-01-27 DIAGNOSIS — L568 Other specified acute skin changes due to ultraviolet radiation: Secondary | ICD-10-CM | POA: Diagnosis not present

## 2021-02-12 ENCOUNTER — Encounter: Payer: Self-pay | Admitting: Internal Medicine

## 2021-02-14 DIAGNOSIS — M1711 Unilateral primary osteoarthritis, right knee: Secondary | ICD-10-CM | POA: Diagnosis not present

## 2021-02-14 DIAGNOSIS — Z96651 Presence of right artificial knee joint: Secondary | ICD-10-CM | POA: Diagnosis not present

## 2021-02-14 DIAGNOSIS — G8918 Other acute postprocedural pain: Secondary | ICD-10-CM | POA: Diagnosis not present

## 2021-02-24 DIAGNOSIS — M25461 Effusion, right knee: Secondary | ICD-10-CM | POA: Diagnosis not present

## 2021-02-24 DIAGNOSIS — Z96651 Presence of right artificial knee joint: Secondary | ICD-10-CM | POA: Diagnosis not present

## 2021-02-25 DIAGNOSIS — M25561 Pain in right knee: Secondary | ICD-10-CM | POA: Diagnosis not present

## 2021-02-25 DIAGNOSIS — R2689 Other abnormalities of gait and mobility: Secondary | ICD-10-CM | POA: Diagnosis not present

## 2021-03-02 DIAGNOSIS — M25561 Pain in right knee: Secondary | ICD-10-CM | POA: Diagnosis not present

## 2021-03-02 DIAGNOSIS — R2689 Other abnormalities of gait and mobility: Secondary | ICD-10-CM | POA: Diagnosis not present

## 2021-03-04 DIAGNOSIS — R2689 Other abnormalities of gait and mobility: Secondary | ICD-10-CM | POA: Diagnosis not present

## 2021-03-04 DIAGNOSIS — M25561 Pain in right knee: Secondary | ICD-10-CM | POA: Diagnosis not present

## 2021-03-09 DIAGNOSIS — M25561 Pain in right knee: Secondary | ICD-10-CM | POA: Diagnosis not present

## 2021-03-09 DIAGNOSIS — R2689 Other abnormalities of gait and mobility: Secondary | ICD-10-CM | POA: Diagnosis not present

## 2021-03-11 DIAGNOSIS — R2689 Other abnormalities of gait and mobility: Secondary | ICD-10-CM | POA: Diagnosis not present

## 2021-03-11 DIAGNOSIS — M25561 Pain in right knee: Secondary | ICD-10-CM | POA: Diagnosis not present

## 2021-03-14 DIAGNOSIS — R2689 Other abnormalities of gait and mobility: Secondary | ICD-10-CM | POA: Diagnosis not present

## 2021-03-14 DIAGNOSIS — M25561 Pain in right knee: Secondary | ICD-10-CM | POA: Diagnosis not present

## 2021-03-17 DIAGNOSIS — R2689 Other abnormalities of gait and mobility: Secondary | ICD-10-CM | POA: Diagnosis not present

## 2021-03-17 DIAGNOSIS — M25561 Pain in right knee: Secondary | ICD-10-CM | POA: Diagnosis not present

## 2021-03-21 DIAGNOSIS — M25561 Pain in right knee: Secondary | ICD-10-CM | POA: Diagnosis not present

## 2021-03-21 DIAGNOSIS — R2689 Other abnormalities of gait and mobility: Secondary | ICD-10-CM | POA: Diagnosis not present

## 2021-03-24 DIAGNOSIS — R2689 Other abnormalities of gait and mobility: Secondary | ICD-10-CM | POA: Diagnosis not present

## 2021-03-24 DIAGNOSIS — M25561 Pain in right knee: Secondary | ICD-10-CM | POA: Diagnosis not present

## 2021-03-24 DIAGNOSIS — Z96652 Presence of left artificial knee joint: Secondary | ICD-10-CM | POA: Diagnosis not present

## 2021-04-05 DIAGNOSIS — M25561 Pain in right knee: Secondary | ICD-10-CM | POA: Diagnosis not present

## 2021-04-05 DIAGNOSIS — R2689 Other abnormalities of gait and mobility: Secondary | ICD-10-CM | POA: Diagnosis not present

## 2021-04-08 DIAGNOSIS — M25561 Pain in right knee: Secondary | ICD-10-CM | POA: Diagnosis not present

## 2021-04-08 DIAGNOSIS — R2689 Other abnormalities of gait and mobility: Secondary | ICD-10-CM | POA: Diagnosis not present

## 2021-04-11 DIAGNOSIS — R2689 Other abnormalities of gait and mobility: Secondary | ICD-10-CM | POA: Diagnosis not present

## 2021-04-11 DIAGNOSIS — M25561 Pain in right knee: Secondary | ICD-10-CM | POA: Diagnosis not present

## 2021-04-18 DIAGNOSIS — L570 Actinic keratosis: Secondary | ICD-10-CM | POA: Diagnosis not present

## 2021-04-18 DIAGNOSIS — Z85828 Personal history of other malignant neoplasm of skin: Secondary | ICD-10-CM | POA: Diagnosis not present

## 2021-04-18 DIAGNOSIS — L820 Inflamed seborrheic keratosis: Secondary | ICD-10-CM | POA: Diagnosis not present

## 2021-04-18 DIAGNOSIS — L821 Other seborrheic keratosis: Secondary | ICD-10-CM | POA: Diagnosis not present

## 2021-04-19 DIAGNOSIS — R2689 Other abnormalities of gait and mobility: Secondary | ICD-10-CM | POA: Diagnosis not present

## 2021-04-19 DIAGNOSIS — M25561 Pain in right knee: Secondary | ICD-10-CM | POA: Diagnosis not present

## 2021-04-21 DIAGNOSIS — M25561 Pain in right knee: Secondary | ICD-10-CM | POA: Diagnosis not present

## 2021-04-21 DIAGNOSIS — R2689 Other abnormalities of gait and mobility: Secondary | ICD-10-CM | POA: Diagnosis not present

## 2021-05-03 DIAGNOSIS — M25561 Pain in right knee: Secondary | ICD-10-CM | POA: Diagnosis not present

## 2021-05-03 DIAGNOSIS — R2689 Other abnormalities of gait and mobility: Secondary | ICD-10-CM | POA: Diagnosis not present

## 2021-05-25 ENCOUNTER — Other Ambulatory Visit: Payer: Self-pay | Admitting: Internal Medicine

## 2021-05-26 DIAGNOSIS — J310 Chronic rhinitis: Secondary | ICD-10-CM | POA: Diagnosis not present

## 2021-05-26 DIAGNOSIS — G4733 Obstructive sleep apnea (adult) (pediatric): Secondary | ICD-10-CM | POA: Diagnosis not present

## 2021-05-26 DIAGNOSIS — J4540 Moderate persistent asthma, uncomplicated: Secondary | ICD-10-CM | POA: Diagnosis not present

## 2021-06-01 ENCOUNTER — Encounter: Payer: Self-pay | Admitting: Internal Medicine

## 2021-06-01 ENCOUNTER — Ambulatory Visit (INDEPENDENT_AMBULATORY_CARE_PROVIDER_SITE_OTHER): Payer: Medicare Other | Admitting: Internal Medicine

## 2021-06-01 VITALS — BP 122/76 | HR 59 | Temp 97.8°F | Ht 72.0 in | Wt 232.2 lb

## 2021-06-01 DIAGNOSIS — E785 Hyperlipidemia, unspecified: Secondary | ICD-10-CM | POA: Diagnosis not present

## 2021-06-01 DIAGNOSIS — R7303 Prediabetes: Secondary | ICD-10-CM

## 2021-06-01 DIAGNOSIS — J449 Chronic obstructive pulmonary disease, unspecified: Secondary | ICD-10-CM | POA: Diagnosis not present

## 2021-06-01 DIAGNOSIS — G4733 Obstructive sleep apnea (adult) (pediatric): Secondary | ICD-10-CM

## 2021-06-01 DIAGNOSIS — I10 Essential (primary) hypertension: Secondary | ICD-10-CM | POA: Diagnosis not present

## 2021-06-01 DIAGNOSIS — Z96652 Presence of left artificial knee joint: Secondary | ICD-10-CM

## 2021-06-01 DIAGNOSIS — I712 Thoracic aortic aneurysm, without rupture, unspecified: Secondary | ICD-10-CM

## 2021-06-01 LAB — COMPREHENSIVE METABOLIC PANEL
ALT: 23 U/L (ref 0–53)
AST: 19 U/L (ref 0–37)
Albumin: 4.2 g/dL (ref 3.5–5.2)
Alkaline Phosphatase: 58 U/L (ref 39–117)
BUN: 20 mg/dL (ref 6–23)
CO2: 33 mEq/L — ABNORMAL HIGH (ref 19–32)
Calcium: 9.5 mg/dL (ref 8.4–10.5)
Chloride: 101 mEq/L (ref 96–112)
Creatinine, Ser: 1.13 mg/dL (ref 0.40–1.50)
GFR: 66.14 mL/min (ref >60.00)
Glucose, Bld: 93 mg/dL (ref 70–99)
Potassium: 3.9 mEq/L (ref 3.5–5.1)
Sodium: 142 mEq/L (ref 135–145)
Total Bilirubin: 1.1 mg/dL (ref 0.2–1.2)
Total Protein: 6.6 g/dL (ref 6.0–8.3)

## 2021-06-01 LAB — HEMOGLOBIN A1C: Hgb A1c MFr Bld: 6.1 % (ref 4.6–6.5)

## 2021-06-01 LAB — LIPID PANEL
Cholesterol: 146 mg/dL (ref 0–200)
HDL: 51 mg/dL (ref >39.00)
LDL Cholesterol: 77 mg/dL (ref 0–99)
NonHDL: 95.46
Total CHOL/HDL Ratio: 3
Triglycerides: 94 mg/dL (ref 0.0–149.0)
VLDL: 18.8 mg/dL (ref 0.0–40.0)

## 2021-06-01 NOTE — Assessment & Plan Note (Signed)
Managed with CPAP by pulmonology ?

## 2021-06-01 NOTE — Patient Instructions (Signed)
No changes today,  as long as BP is < 130/80   ? ?[employer], [employer],  [employer] and Weight watchers are all excellent programs for losing weight  ? ? ?

## 2021-06-01 NOTE — Assessment & Plan Note (Signed)
He has been asymptomatic on current regimen ?

## 2021-06-01 NOTE — Progress Notes (Signed)
? ?Subjective:  ?Patient ID: Charles Mccann, male    DOB: [birth year]  Age: 71 y.o. MRN: [id number] ? ?CC: The primary encounter diagnosis was Primary hypertension. Diagnoses of Hyperlipidemia with target LDL less than 100, Prediabetes, Asthma-chronic obstructive pulmonary disease overlap syndrome ([place]), Obstructive sleep apnea of adult, S/P TKR (total knee replacement) using cement, left, and Thoracic aortic aneurysm without rupture, unspecified part ([place]) were also pertinent to this visit. ? ? ? ?HPI ?Charles Mccann presents for  ?Chief Complaint  ?Patient presents with  ? Follow-up  ?  6 month follow up on hypertension, hyperlipidemia  ? ?Hypertension: patient checks blood pressure twice weekly at home.  Readings have been for the most part <  130/80 at rest . Patient is following a reduced salt diet most days and is taking medications as prescribed  ? ?Muscle cramps at night  in calves, do not occur with exercise .  Exercises regularly ? ?No recent asthma flares  ? ?Knee replacement ,  left knee in January 2022.  Fully recovered  ? ?OSA:  she is Sleeping well with CPAP   managed by [facility] Del.  Averages 7 hours per night  ? ? ?Outpatient Medications Prior to Visit  ?Medication Sig Dispense Refill  ? albuterol (PROVENTIL HFA;VENTOLIN HFA) 108 (90 BASE) MCG/ACT inhaler Inhale 2 puffs into the lungs every 6 (six) hours as needed.    ? atorvastatin (LIPITOR) 20 MG tablet TAKE 1 TABLET BY MOUTH DAILY. 90 tablet 3  ? azelastine (ASTELIN) 0.1 % nasal spray Place into the nose.    ? budesonide-formoterol (SYMBICORT) 160-4.5 MCG/ACT inhaler Inhale 2 puffs into the lungs 2 (two) times daily.    ? celecoxib (CELEBREX) 200 MG capsule Take 200 mg by mouth 2 (two) times daily as needed.    ? Cholecalciferol (VITAMIN D) 2000 units CAPS Take 2,000 Units by mouth every morning.     ? fluticasone (FLONASE) 50 MCG/ACT nasal spray Place 2 sprays into both nostrils at bedtime.     ? hydrochlorothiazide (HYDRODIURIL) 25 MG tablet  TAKE ONE TABLET BY MOUTH EVERY DAY 90 tablet 3  ? metoprolol succinate (TOPROL-XL) 25 MG 24 hr tablet Take 25 mg by mouth daily.    ? montelukast (SINGULAIR) 10 MG tablet Take 10 mg by mouth at bedtime.     ? Potassium 99 MG TABS Take 2 tablets by mouth daily.    ? predniSONE (DELTASONE) 10 MG tablet Take 5 mg by mouth.     ? Turmeric 400 MG CAPS Take 1 capsule by mouth daily.    ? omeprazole (PRILOSEC) 40 MG capsule Take by mouth.    ? Coenzyme Q10 (CO Q 10 PO) Take 500 mg by mouth daily.    ? ?No facility-administered medications prior to visit.  ? ? ?Review of Systems; ? ?Patient denies headache, fevers, malaise, unintentional weight loss, skin rash, eye pain, sinus congestion and sinus pain, sore throat, dysphagia,  hemoptysis , cough, dyspnea, wheezing, chest pain, palpitations, orthopnea, edema, abdominal pain, nausea, melena, diarrhea, constipation, flank pain, dysuria, hematuria, urinary  Frequency, nocturia, numbness, tingling, seizures,  Focal weakness, Loss of consciousness,  Tremor, insomnia, depression, anxiety, and suicidal ideation.   ? ? ? ?Objective:  ?BP 122/76 (BP Location: Left Arm, Patient Position: Sitting, Cuff Size: Normal)   Pulse (!) 59   Temp 97.8 ?F (36.6 ?C) (Oral)   Ht 6' (1.829 m)   Wt 232 lb 3.2 oz (105.3 kg)   SpO2 97%  BMI 31.49 kg/m?  ? ?BP Readings from Last 3 Encounters:  ?06/01/21 122/76  ?12/01/20 118/83  ?05/31/20 106/80  ? ? ?Wt Readings from Last 3 Encounters:  ?06/01/21 232 lb 3.2 oz (105.3 kg)  ?12/01/20 227 lb 3.2 oz (103.1 kg)  ?09/07/20 223 lb (101.2 kg)  ? ? ?General appearance: alert, cooperative and appears stated age ?Ears: normal TM's and external ear canals both ears ?Throat: lips, mucosa, and tongue normal; teeth and gums normal ?Neck: no adenopathy, no carotid bruit, supple, symmetrical, trachea midline and thyroid not enlarged, symmetric, no tenderness/mass/nodules ?Back: symmetric, no curvature. ROM normal. No CVA tenderness. ?Lungs: clear to  auscultation bilaterally ?Heart: regular rate and rhythm, S1, S2 normal, no murmur, click, rub or gallop ?Abdomen: soft, non-tender; bowel sounds normal; no masses,  no organomegaly ?Pulses: 2+ and symmetric ?Skin: Skin color, texture, turgor normal. No rashes or lesions ?Lymph nodes: Cervical, supraclavicular, and axillary nodes normal. ? ?Lab Results  ?Component Value Date  ? HGBA1C 6.1 06/01/2021  ? HGBA1C 6.2 12/01/2020  ? HGBA1C 5.9 05/31/2020  ? ? ?Lab Results  ?Component Value Date  ? CREATININE 1.13 06/01/2021  ? CREATININE 1.12 12/01/2020  ? CREATININE 1.05 05/31/2020  ? ? ?Lab Results  ?Component Value Date  ? WBC 7.8 12/01/2020  ? HGB 15.1 12/01/2020  ? HCT 45.6 12/01/2020  ? PLT 226.0 12/01/2020  ? GLUCOSE 93 06/01/2021  ? CHOL 146 06/01/2021  ? TRIG 94.0 06/01/2021  ? HDL 51.00 06/01/2021  ? LDLDIRECT 73.6 11/27/2011  ? [place] 77 06/01/2021  ? ALT 23 06/01/2021  ? AST 19 06/01/2021  ? NA 142 06/01/2021  ? K 3.9 06/01/2021  ? CL 101 06/01/2021  ? CREATININE 1.13 06/01/2021  ? BUN 20 06/01/2021  ? CO2 33 (H) 06/01/2021  ? TSH 2.36 12/01/2020  ? PSA 1.30 12/01/2020  ? HGBA1C 6.1 06/01/2021  ? MICROALBUR <0.7 12/01/2020  ? ? ?No results found. ? ?Assessment & Plan:  ? ?Problem List Items Addressed This Visit   ? ? Aneurysm of thoracic aorta (HCC)  ?  His aortic aneurysm is stable by repeat ECHO done at [facility] June 2022.  BP has been maintained at 120/70 or less  ? ?  ?  ? Asthma-chronic obstructive pulmonary disease overlap syndrome ([place])  ?  He has been asymptomatic on current regimen ? ?  ?  ? Hyperlipidemia with target LDL less than 100  ?  Managed with atorvastatin , LDL is 77.  No changes today ? ?Lab Results  ?Component Value Date  ? CHOL 146 06/01/2021  ? HDL 51.00 06/01/2021  ? [place] 77 06/01/2021  ? LDLDIRECT 73.6 11/27/2011  ? TRIG 94.0 06/01/2021  ? CHOLHDL 3 06/01/2021  ? ?Lab Results  ?Component Value Date  ? ALT 23 06/01/2021  ? AST 19 06/01/2021  ? ALKPHOS 58 06/01/2021  ? BILITOT 1.1  06/01/2021  ? ? ? ?  ?  ? Relevant Orders  ? Lipid Profile (Completed)  ? Hypertension - Primary  ? Relevant Orders  ? Comp Met (CMET) (Completed)  ? Obstructive sleep apnea of adult  ?  Managed with CPAP by pulmonology ? ?  ?  ? Prediabetes  ? Relevant Orders  ? Comp Met (CMET) (Completed)  ? HgB A1c (Completed)  ? S/P TKR (total knee replacement) using cement, left  ?  Done in Jan 2022 with restoration of function and return to exercise.  ? ?  ?  ? ? ?I spent a total  of  32  minutes with this patient in a face to face visit on the date of this encounter reviewing  his most recent with his pulmonologist and orthopedist,  blood pressure readings ,  last ECHO , and  post visit ordering of testing and therapeutics.   ? ?Follow-up: Return in about 6 months (around 12/01/2021). ? ? ?Crecencio Mc, MD ?

## 2021-06-02 ENCOUNTER — Encounter: Payer: Self-pay | Admitting: Internal Medicine

## 2021-06-02 NOTE — Assessment & Plan Note (Signed)
Managed with atorvastatin , LDL is 77.  No changes today ? ?Lab Results  ?Component Value Date  ? CHOL 146 06/01/2021  ? HDL 51.00 06/01/2021  ? [place] 77 06/01/2021  ? LDLDIRECT 73.6 11/27/2011  ? TRIG 94.0 06/01/2021  ? CHOLHDL 3 06/01/2021  ? ?Lab Results  ?Component Value Date  ? ALT 23 06/01/2021  ? AST 19 06/01/2021  ? ALKPHOS 58 06/01/2021  ? BILITOT 1.1 06/01/2021  ? ? ? ?

## 2021-06-02 NOTE — Assessment & Plan Note (Signed)
Done in Jan 2022 with restoration of function and return to exercise.  ?

## 2021-06-02 NOTE — Assessment & Plan Note (Addendum)
His aortic aneurysm is stable by repeat ECHO done at [facility] June 2022.  BP has been maintained at 120/70 or less  ?

## 2021-09-06 ENCOUNTER — Telehealth: Payer: Self-pay | Admitting: Internal Medicine

## 2021-09-06 NOTE — Telephone Encounter (Signed)
Spoke with patient he req CB 09/20/21

## 2021-09-19 ENCOUNTER — Telehealth: Payer: Self-pay | Admitting: Internal Medicine

## 2021-09-19 NOTE — Telephone Encounter (Signed)
Left message for patient to schedule Annual Wellness Visit.  Please schedule with Nurse Health Advisor Denisa O'Brien-Blaney, LPN at [facility]. This appt can be telephone or office visit.  Please call [phone] ask for [facility]

## 2021-09-27 ENCOUNTER — Encounter: Payer: Self-pay | Admitting: Internal Medicine

## 2021-09-28 ENCOUNTER — Encounter: Payer: Self-pay | Admitting: Internal Medicine

## 2021-09-28 ENCOUNTER — Telehealth (INDEPENDENT_AMBULATORY_CARE_PROVIDER_SITE_OTHER): Payer: Medicare Other | Admitting: Internal Medicine

## 2021-09-28 DIAGNOSIS — R059 Cough, unspecified: Secondary | ICD-10-CM

## 2021-09-28 DIAGNOSIS — J22 Unspecified acute lower respiratory infection: Secondary | ICD-10-CM | POA: Diagnosis not present

## 2021-09-28 MED ORDER — HYDROCOD POLI-CHLORPHE POLI ER 10-8 MG/5ML PO SUER: 10.0000 mL | Freq: Two times a day (BID) | ORAL | 0 refills | Status: DC | PRN

## 2021-09-28 MED ORDER — PREDNISONE 10 MG PO TABS: ORAL_TABLET | ORAL | 0 refills | Status: DC

## 2021-09-28 MED ORDER — AMOXICILLIN-POT CLAVULANATE 875-125 MG PO TABS: 1.0000 | ORAL_TABLET | Freq: Two times a day (BID) | ORAL | 0 refills | Status: DC

## 2021-09-28 NOTE — Progress Notes (Signed)
Virtual Visit via [place]   Note    This format is felt to be most appropriate for this patient at this time.  All issues noted in this document were discussed and addressed.  No physical exam was performed (except for noted visual exam findings with Video Visits).   I connected with Mr Charles Mccann on 09/28/21 at 10:00 AM EDT by a video enabled telemedicine application  and verified that I am speaking with the correct person using two identifiers. Location patient: home Location provider: work or home office Persons participating in the virtual visit: patient, provider  I discussed the limitations, risks, security and privacy concerns of performing an evaluation and management service by telephone and the availability of in person appointments. I also discussed with the patient that there may be a patient responsible charge related to this service. The patient expressed understanding and agreed to proceed.  Reason for visit: sinusitis/asthma   HPI:   71- yr old male  with asthma/COPD overlap syndrome, hypertension, aortic aneurysm,  s.p tetralogy of Fallot repair presents with ten day history of sinus congestion , sinus drainage , wheezing, and purulent sputum.  Using sudafed , advil,  tussin with no improvement.  Sick contacts,  recent rip to [place].  Has not tested himself for COVID.  Using xopenex nebs and take 10 mg daily chronically per pulmonologist.  Denies fevers,  pleurisy    ROS: See pertinent positives and negatives per HPI.  Past Medical History:  Diagnosis Date   Arthritis    Asthma    managed by Raul Del   Bronchitis, chronic obstructive, with exacerbation ([place]) 04/26/2014   COPD (chronic obstructive pulmonary disease) ([place])    GERD (gastroesophageal reflux disease)    controlled only with nexium priro prevacid failutre   Heart murmur    Hypercholesterolemia    Hyperlipidemia with target LDL less than 100 05/28/2011   Hypertension    Impotence due to erectile dysfunction  04/26/2014   Left inguinal hernia 04/07/2016   Obstructive sleep apnea of adult 2012   on CPAP  tolerating ,  12 cm H20 , room air    Palpitations 11/27/2011   Tetralogy of Fallot    Thoracic aortic aneurysm [facility])    Treadmill stress test negative for angina pectoris 5284   Umbilical hernia without obstruction and without gangrene 03/10/2016    Past Surgical History:  Procedure Laterality Date   CARDIAC CATHETERIZATION     CATARACT EXTRACTION W/PHACO Right 11/16/2014   Procedure: CATARACT EXTRACTION PHACO AND INTRAOCULAR LENS PLACEMENT ([place]);  Surgeon: Estill Cotta, MD;  Location: [facility] ORS;  Service: Ophthalmology;  Laterality: Right;  XLK#[id number] H US:01:03.9 AP%:24.3% CDE:30.12   CATARACT EXTRACTION W/PHACO Left 02/21/2017   Procedure: CATARACT EXTRACTION PHACO AND INTRAOCULAR LENS PLACEMENT ([place]) LEFT;  Surgeon: Leandrew Koyanagi, MD;  Location: [place];  Service: Ophthalmology;  Laterality: Left;  requests later   COLONOSCOPY WITH PROPOFOL N/A 06/17/2019   Procedure: COLONOSCOPY WITH PROPOFOL;  Surgeon: [name], MD;  Location: [facility] ENDOSCOPY;  Service: Endoscopy;  Laterality: N/A;   EYE SURGERY     INGUINAL HERNIA REPAIR Left 04/20/2016   Procedure: HERNIA REPAIR INGUINAL ADULT WITH MESH;  Surgeon: Clayburn Pert, MD;  Location: [facility] ORS;  Service: General;  Laterality: Left;   TETRALOGY OF FALLOT REPAIR  1974   [facility] [place]   TONSILLECTOMY     UMBILICAL HERNIA REPAIR N/A 04/20/2016   Procedure: HERNIA REPAIR UMBILICAL ADULT WITH MESH ;  Surgeon: Clayburn Pert, MD;  Location:  [facility] ORS;  Service: General;  Laterality: N/A;    Family History  Problem Relation Age of Onset   Heart disease Father 80       CAD   Cancer Brother 72       liver CA mets to brain    Cancer Brother        colon Ca , stomach CA    SOCIAL HX:  reports that he has never smoked. He has never used smokeless tobacco. He reports current alcohol use of about 14.0 standard drinks of  alcohol per week. He reports that he does not use drugs.    Current Outpatient Medications:    albuterol (PROVENTIL HFA;VENTOLIN HFA) 108 (90 BASE) MCG/ACT inhaler, Inhale 2 puffs into the lungs every 6 (six) hours as needed., Disp: , Rfl:    amoxicillin-clavulanate (AUGMENTIN) 875-125 MG tablet, Take 1 tablet by mouth 2 (two) times daily., Disp: 14 tablet, Rfl: 0   atorvastatin (LIPITOR) 20 MG tablet, TAKE 1 TABLET BY MOUTH DAILY., Disp: 90 tablet, Rfl: 3   azelastine (ASTELIN) 0.1 % nasal spray, Place into the nose., Disp: , Rfl:    budesonide-formoterol (SYMBICORT) 160-4.5 MCG/ACT inhaler, Inhale 2 puffs into the lungs 2 (two) times daily., Disp: , Rfl:    celecoxib (CELEBREX) 200 MG capsule, Take 200 mg by mouth 2 (two) times daily as needed., Disp: , Rfl:    chlorpheniramine-HYDROcodone (TUSSIONEX) 10-8 MG/5ML, Take 10 mLs by mouth every 12 (twelve) hours as needed for cough., Disp: 180 mL, Rfl: 0   Cholecalciferol (VITAMIN D) 2000 units CAPS, Take 2,000 Units by mouth every morning. , Disp: , Rfl:    fluticasone (FLONASE) 50 MCG/ACT nasal spray, Place 2 sprays into both nostrils at bedtime. , Disp: , Rfl:    hydrochlorothiazide (HYDRODIURIL) 25 MG tablet, TAKE ONE TABLET BY MOUTH EVERY DAY, Disp: 90 tablet, Rfl: 3   metoprolol succinate (TOPROL-XL) 25 MG 24 hr tablet, Take 25 mg by mouth daily., Disp: , Rfl:    montelukast (SINGULAIR) 10 MG tablet, Take 10 mg by mouth at bedtime. , Disp: , Rfl:    omeprazole (PRILOSEC) 40 MG capsule, Take by mouth., Disp: , Rfl:    Potassium 99 MG TABS, Take 2 tablets by mouth daily., Disp: , Rfl:    predniSONE (DELTASONE) 10 MG tablet, Take 5 mg by mouth. , Disp: , Rfl:    predniSONE (DELTASONE) 10 MG tablet, 6 tablets daily for 3 days, then reduce by 1 tablet daily until gone, Disp: 33 tablet, Rfl: 0   Turmeric 400 MG CAPS, Take 1 capsule by mouth daily., Disp: , Rfl:   EXAM:  VITALS per patient if applicable:  GENERAL: alert, oriented, appears well  and in no acute distress  HEENT: atraumatic, conjunttiva clear, no obvious abnormalities on inspection of external nose and ears  NECK: normal movements of the head and neck  LUNGS: on inspection no signs of respiratory distress, breathing rate appears normal, no obvious gross SOB, gasping or wheezing  CV: no obvious cyanosis  MS: moves all visible extremities without noticeable abnormality  PSYCH/NEURO: pleasant and cooperative, no obvious depression or anxiety, speech and thought processing grossly intact  ASSESSMENT AND PLAN:  Discussed the following assessment and plan:  Cough in adult patient - Plan: DG Chest 2 View  Lower respiratory infection (e.g., bronchitis, pneumonia, pneumonitis, pulmonitis)  Lower respiratory infection (e.g., bronchitis, pneumonia, pneumonitis, pulmonitis) 10 day history of symptoms after trip to [place].  Steroid taper,  augmentin and cough  suppressant.  highly advised to home check for  COVID and return to clinic for chest x ray to rule out infiltrate, which would necessitate 2nd antibiotic empiric coverage.    I discussed the assessment and treatment plan with the patient. The patient was provided an opportunity to ask questions and all were answered. The patient agreed with the plan and demonstrated an understanding of the instructions.   The patient was advised to call back or seek an in-person evaluation if the symptoms worsen or if the condition fails to improve as anticipated.   I spent  20 minutes dedicated to the care of this patient on the date of this encounter to include pre-visit review of his medical history,  Face-to-face time with the patient , and post visit ordering of testing and therapeutics.    Crecencio Mc, MD

## 2021-09-28 NOTE — Assessment & Plan Note (Signed)
10 day history of symptoms after trip to [place].  Steroid taper,  augmentin and cough suppressant.  highly advised to home check for  COVID and return to clinic for chest x ray to rule out infiltrate, which would necessitate 2nd antibiotic empiric coverage.

## 2021-09-28 NOTE — Telephone Encounter (Signed)
Patient scheduled for VV at 10 this morning.

## 2021-09-30 ENCOUNTER — Ambulatory Visit (INDEPENDENT_AMBULATORY_CARE_PROVIDER_SITE_OTHER): Payer: Medicare Other

## 2021-09-30 ENCOUNTER — Other Ambulatory Visit: Payer: Medicare Other

## 2021-09-30 DIAGNOSIS — R062 Wheezing: Secondary | ICD-10-CM | POA: Diagnosis not present

## 2021-09-30 DIAGNOSIS — R059 Cough, unspecified: Secondary | ICD-10-CM

## 2021-09-30 DIAGNOSIS — J9811 Atelectasis: Secondary | ICD-10-CM | POA: Diagnosis not present

## 2021-10-01 ENCOUNTER — Other Ambulatory Visit: Payer: Self-pay | Admitting: Internal Medicine

## 2021-10-01 MED ORDER — BENZONATATE 200 MG PO CAPS: 200.0000 mg | ORAL_CAPSULE | Freq: Three times a day (TID) | ORAL | 1 refills | Status: DC | PRN

## 2021-10-04 ENCOUNTER — Telehealth: Payer: Self-pay | Admitting: Internal Medicine

## 2021-10-04 NOTE — Telephone Encounter (Signed)
Pt called stating he was told to call back if he did not feel any better. Pt stated his last antibiotics end today and he still has a bad cough and looseness in his lungs

## 2021-10-05 ENCOUNTER — Other Ambulatory Visit: Payer: Self-pay | Admitting: Internal Medicine

## 2021-10-05 MED ORDER — CHERATUSSIN AC 100-10 MG/5ML PO SOLN: 5.0000 mL | Freq: Three times a day (TID) | ORAL | 0 refills | Status: DC | PRN

## 2021-10-05 MED ORDER — PREDNISONE 20 MG PO TABS: 20.0000 mg | ORAL_TABLET | Freq: Every day | ORAL | 0 refills | Status: DC

## 2021-10-05 NOTE — Telephone Encounter (Signed)
Pt is aware of the medication that were sent in.

## 2021-10-05 NOTE — Telephone Encounter (Signed)
Spoke wit pt and he stated that he has not had any fevers, the phlegm is mostly clear sometimes has a tinge of green. The wheezing he stated is more like a rattle in is chest.

## 2021-10-05 NOTE — Telephone Encounter (Signed)
Disregard prior reply.  I've sent 5 more days of prednisone and an rx for cher[facility]ussin [facility] for [facility]

## 2021-10-05 NOTE — Telephone Encounter (Signed)
See my chart message

## 2021-10-17 DIAGNOSIS — L820 Inflamed seborrheic keratosis: Secondary | ICD-10-CM | POA: Diagnosis not present

## 2021-10-17 DIAGNOSIS — D492 Neoplasm of unspecified behavior of bone, soft tissue, and skin: Secondary | ICD-10-CM | POA: Diagnosis not present

## 2021-10-17 DIAGNOSIS — L570 Actinic keratosis: Secondary | ICD-10-CM | POA: Diagnosis not present

## 2021-10-17 DIAGNOSIS — C44519 Basal cell carcinoma of skin of other part of trunk: Secondary | ICD-10-CM | POA: Diagnosis not present

## 2021-10-17 DIAGNOSIS — L821 Other seborrheic keratosis: Secondary | ICD-10-CM | POA: Diagnosis not present

## 2021-10-19 ENCOUNTER — Ambulatory Visit (INDEPENDENT_AMBULATORY_CARE_PROVIDER_SITE_OTHER): Payer: Medicare Other

## 2021-10-19 ENCOUNTER — Telehealth: Payer: Self-pay

## 2021-10-19 VITALS — Ht 72.0 in | Wt 212.0 lb

## 2021-10-19 DIAGNOSIS — Z0000 Encounter for general adult medical examination without abnormal findings: Secondary | ICD-10-CM | POA: Diagnosis not present

## 2021-10-19 NOTE — Patient Instructions (Addendum)
Charles Mccann , Thank you for taking time to come for your Medicare Wellness Visit. I appreciate your ongoing commitment to your health goals. Please review the following plan we discussed and let me know if I can assist you in the future.   These are the goals we discussed:  Goals       Patient Stated     Follow up with Primary Care Provider (pt-stated)      Routine scheduled appointments        This is a list of the screening recommended for you and due dates:  Health Maintenance  Topic Date Due   COVID-19 Vaccine (5 - Moderna risk series) 11/04/2021*   Flu Shot  05/07/2022*   Tetanus Vaccine  01/10/2023   Colon Cancer Screening  06/16/2024   Pneumonia Vaccine  Completed   Hepatitis C Screening: USPSTF Recommendation to screen - Ages 18-79 yo.  Completed   Zoster (Shingles) Vaccine  Completed   HPV Vaccine  Aged Out  *Topic was postponed. The date shown is not the original due date.   Next appointment: Follow up in one year for your annual wellness visit.   Preventive Care 70 Years and Older, Male Preventive care refers to lifestyle choices and visits with your health care provider that can promote health and wellness. What does preventive care include? A yearly physical exam. This is also called an annual well check. Dental exams once or twice a year. Routine eye exams. Ask your health care provider how often you should have your eyes checked. Personal lifestyle choices, including: Daily care of your teeth and gums. Regular physical activity. Eating a healthy diet. Avoiding tobacco and drug use. Limiting alcohol use. Practicing safe sex. Taking low doses of aspirin every day. Taking vitamin and mineral supplements as recommended by your health care provider. What happens during an annual well check? The services and screenings done by your health care provider during your annual well check will depend on your age, overall health, lifestyle risk factors, and family  history of disease. Counseling  Your health care provider may ask you questions about your: Alcohol use. Tobacco use. Drug use. Emotional well-being. Home and relationship well-being. Sexual activity. Eating habits. History of falls. Memory and ability to understand (cognition). Work and work Statistician. Screening  You may have the following tests or measurements: Height, weight, and BMI. Blood pressure. Lipid and cholesterol levels. These may be checked every 5 years, or more frequently if you are over 70 years old. Skin check. Lung cancer screening. You may have this screening every year starting at age 70 if you have a 30-pack-year history of smoking and currently smoke or have quit within the past 15 years. Fecal occult blood test (FOBT) of the stool. You may have this test every year starting at age 70. Flexible sigmoidoscopy or colonoscopy. You may have a sigmoidoscopy every 5 years or a colonoscopy every 10 years starting at age 70. Prostate cancer screening. Recommendations will vary depending on your family history and other risks. Hepatitis C blood test. Hepatitis B blood test. Sexually transmitted disease (STD) testing. Diabetes screening. This is done by checking your blood sugar (glucose) after you have not eaten for a while (fasting). You may have this done every 1-3 years. Abdominal aortic aneurysm (AAA) screening. You may need this if you are a current or former smoker. Osteoporosis. You may be screened starting at age 70 if you are at high risk. Talk with your health care provider about  your test results, treatment options, and if necessary, the need for more tests. Vaccines  Your health care provider may recommend certain vaccines, such as: Influenza vaccine. This is recommended every year. Tetanus, diphtheria, and acellular pertussis (Tdap, Td) vaccine. You may need a Td booster every 10 years. Zoster vaccine. You may need this after age 45. Pneumococcal  13-valent conjugate (PCV13) vaccine. One dose is recommended after age 10. Pneumococcal polysaccharide (PPSV23) vaccine. One dose is recommended after age 8. Talk to your health care provider about which screenings and vaccines you need and how often you need them. This information is not intended to replace advice given to you by your health care provider. Make sure you discuss any questions you have with your health care provider. Document Released: 02/19/2015 Document Revised: 10/13/2015 Document Reviewed: 11/24/2014 Elsevier Interactive Patient Education  2017 [place] Prevention in the Home Falls can cause injuries. They can happen to people of all ages. There are many things you can do to make your home safe and to help prevent falls. What can I do on the outside of my home? Regularly fix the edges of walkways and driveways and fix any cracks. Remove anything that might make you trip as you walk through a door, such as a raised step or threshold. Trim any bushes or trees on the path to your home. Use bright outdoor lighting. Clear any walking paths of anything that might make someone trip, such as rocks or tools. Regularly check to see if handrails are loose or broken. Make sure that both sides of any steps have handrails. Any raised decks and porches should have guardrails on the edges. Have any leaves, snow, or ice cleared regularly. Use sand or salt on walking paths during winter. Clean up any spills in your garage right away. This includes oil or grease spills. What can I do in the bathroom? Use night lights. Install grab bars by the toilet and in the tub and shower. Do not use towel bars as grab bars. Use non-skid mats or decals in the tub or shower. If you need to sit down in the shower, use a plastic, non-slip stool. Keep the floor dry. Clean up any water that spills on the floor as soon as it happens. Remove soap buildup in the tub or shower regularly. Attach  bath mats securely with double-sided non-slip rug tape. Do not have throw rugs and other things on the floor that can make you trip. What can I do in the bedroom? Use night lights. Make sure that you have a light by your bed that is easy to reach. Do not use any sheets or blankets that are too big for your bed. They should not hang down onto the floor. Have a firm chair that has side arms. You can use this for support while you get dressed. Do not have throw rugs and other things on the floor that can make you trip. What can I do in the kitchen? Clean up any spills right away. Avoid walking on wet floors. Keep items that you use a lot in easy-to-reach places. If you need to reach something above you, use a strong step stool that has a grab bar. Keep electrical cords out of the way. Do not use floor polish or wax that makes floors slippery. If you must use wax, use non-skid floor wax. Do not have throw rugs and other things on the floor that can make you trip. What can I do with  my stairs? Do not leave any items on the stairs. Make sure that there are handrails on both sides of the stairs and use them. Fix handrails that are broken or loose. Make sure that handrails are as long as the stairways. Check any carpeting to make sure that it is firmly attached to the stairs. Fix any carpet that is loose or worn. Avoid having throw rugs at the top or bottom of the stairs. If you do have throw rugs, attach them to the floor with carpet tape. Make sure that you have a light switch at the top of the stairs and the bottom of the stairs. If you do not have them, ask someone to add them for you. What else can I do to help prevent falls? Wear shoes that: Do not have high heels. Have rubber bottoms. Are comfortable and fit you well. Are closed at the toe. Do not wear sandals. If you use a stepladder: Make sure that it is fully opened. Do not climb a closed stepladder. Make sure that both sides of the  stepladder are locked into place. Ask someone to hold it for you, if possible. Clearly mark and make sure that you can see: Any grab bars or handrails. First and last steps. Where the edge of each step is. Use tools that help you move around (mobility aids) if they are needed. These include: Canes. Walkers. Scooters. Crutches. Turn on the lights when you go into a dark area. Replace any light bulbs as soon as they burn out. Set up your furniture so you have a clear path. Avoid moving your furniture around. If any of your floors are uneven, fix them. If there are any pets around you, be aware of where they are. Review your medicines with your doctor. Some medicines can make you feel dizzy. This can increase your chance of falling. Ask your doctor what other things that you can do to help prevent falls. This information is not intended to replace advice given to you by your health care provider. Make sure you discuss any questions you have with your health care provider. Document Released: 11/19/2008 Document Revised: 07/01/2015 Document Reviewed: 02/27/2014 Elsevier Interactive Patient Education  2017 [employer].

## 2021-10-19 NOTE — Progress Notes (Signed)
Subjective:   Charles Mccann is a 71 y.o. male who presents for Medicare Annual/Subsequent preventive examination.  Review of Systems    No ROS.  Medicare Wellness Virtual Visit.  Visual/audio telehealth visit, UTA vital signs.   See social history for additional risk factors.   Cardiac Risk Factors include: advanced age (>52mn, >>49women);male gender;hypertension     Objective:    Today's Vitals   10/19/21 1112  Weight: 212 lb (96.2 kg)  Height: 6' (1.829 m)   Body mass index is 28.75 kg/m.     10/19/2021   11:16 AM 09/07/2020   11:23 AM 09/05/2019   11:33 AM 09/05/2019   11:15 AM 06/17/2019    8:12 AM 09/04/2018   11:17 AM 02/21/2017    8:40 AM  Advanced Directives  Does Patient Have a Medical Advance Directive? Yes Yes Yes Yes Yes Yes Yes  Type of AParamedicof APeridotLiving will HSeneca GardensLiving will HCollege ParkLiving will HChadwicksLiving will HHansboroLiving will HCrawfordLiving will HAlvordLiving will  Does patient want to make changes to medical advance directive? No - Patient declined No - Patient declined No - Patient declined No - Patient declined  No - Patient declined No - Patient declined  Copy of HGate Cityin Chart? No - copy requested No - copy requested No - copy requested No - copy requested No - copy requested No - copy requested No - copy requested    Current Medications (verified) Outpatient Encounter Medications as of 10/19/2021  Medication Sig   albuterol (PROVENTIL HFA;VENTOLIN HFA) 108 (90 BASE) MCG/ACT inhaler Inhale 2 puffs into the lungs every 6 (six) hours as needed.   amoxicillin-clavulanate (AUGMENTIN) 875-125 MG tablet Take 1 tablet by mouth 2 (two) times daily.   atorvastatin (LIPITOR) 20 MG tablet TAKE 1 TABLET BY MOUTH DAILY.   azelastine (ASTELIN) 0.1 % nasal spray Place into the  nose.   benzonatate (TESSALON) 200 MG capsule Take 1 capsule (200 mg total) by mouth 3 (three) times daily as needed for cough.   budesonide-formoterol (SYMBICORT) 160-4.5 MCG/ACT inhaler Inhale 2 puffs into the lungs 2 (two) times daily.   celecoxib (CELEBREX) 200 MG capsule Take 200 mg by mouth 2 (two) times daily as needed.   Cholecalciferol (VITAMIN D) 2000 units CAPS Take 2,000 Units by mouth every morning.    fluticasone (FLONASE) 50 MCG/ACT nasal spray Place 2 sprays into both nostrils at bedtime.    guaiFENesin-codeine (CHERATUSSIN AC) 100-10 MG/5ML syrup Take 5 mLs by mouth 3 (three) times daily as needed for cough.   hydrochlorothiazide (HYDRODIURIL) 25 MG tablet TAKE ONE TABLET BY MOUTH EVERY DAY   metoprolol succinate (TOPROL-XL) 25 MG 24 hr tablet Take 25 mg by mouth daily.   montelukast (SINGULAIR) 10 MG tablet Take 10 mg by mouth at bedtime.    omeprazole (PRILOSEC) 40 MG capsule Take by mouth.   Potassium 99 MG TABS Take 2 tablets by mouth daily.   predniSONE (DELTASONE) 10 MG tablet Take 5 mg by mouth.    predniSONE (DELTASONE) 10 MG tablet 6 tablets daily for 3 days, then reduce by 1 tablet daily until gone   predniSONE (DELTASONE) 20 MG tablet Take 1 tablet (20 mg total) by mouth daily with breakfast.   Turmeric 400 MG CAPS Take 1 capsule by mouth daily.   No facility-administered encounter medications on file as of  10/19/2021.    Allergies (verified) Other and Fish allergy   History: Past Medical History:  Diagnosis Date   Arthritis    Asthma    managed by Raul Del   Bronchitis, chronic obstructive, with exacerbation ([place]) 04/26/2014   COPD (chronic obstructive pulmonary disease) ([place])    GERD (gastroesophageal reflux disease)    controlled only with nexium priro prevacid failutre   Heart murmur    Hypercholesterolemia    Hyperlipidemia with target LDL less than 100 05/28/2011   Hypertension    Impotence due to erectile dysfunction 04/26/2014   Left inguinal  hernia 04/07/2016   Obstructive sleep apnea of adult 2012   on CPAP  tolerating ,  12 cm H20 , room air    Palpitations 11/27/2011   Tetralogy of Fallot    Thoracic aortic aneurysm [facility])    Treadmill stress test negative for angina pectoris 7253   Umbilical hernia without obstruction and without gangrene 03/10/2016   Past Surgical History:  Procedure Laterality Date   CARDIAC CATHETERIZATION     CATARACT EXTRACTION W/PHACO Right 11/16/2014   Procedure: CATARACT EXTRACTION PHACO AND INTRAOCULAR LENS PLACEMENT ([place]);  Surgeon: Estill Cotta, MD;  Location: [facility] ORS;  Service: Ophthalmology;  Laterality: Right;  GUY#[id number] H US:01:03.9 AP%:24.3% CDE:30.12   CATARACT EXTRACTION W/PHACO Left 02/21/2017   Procedure: CATARACT EXTRACTION PHACO AND INTRAOCULAR LENS PLACEMENT ([place]) LEFT;  Surgeon: Leandrew Koyanagi, MD;  Location: [place];  Service: Ophthalmology;  Laterality: Left;  requests later   COLONOSCOPY WITH PROPOFOL N/A 06/17/2019   Procedure: COLONOSCOPY WITH PROPOFOL;  Surgeon: [name], MD;  Location: [facility] ENDOSCOPY;  Service: Endoscopy;  Laterality: N/A;   EYE SURGERY     INGUINAL HERNIA REPAIR Left 04/20/2016   Procedure: HERNIA REPAIR INGUINAL ADULT WITH MESH;  Surgeon: Clayburn Pert, MD;  Location: [facility] ORS;  Service: General;  Laterality: Left;   TETRALOGY OF FALLOT REPAIR  1974   [facility] [place]   TONSILLECTOMY     UMBILICAL HERNIA REPAIR N/A 04/20/2016   Procedure: HERNIA REPAIR UMBILICAL ADULT WITH MESH ;  Surgeon: Clayburn Pert, MD;  Location: [facility] ORS;  Service: General;  Laterality: N/A;   Family History  Problem Relation Age of Onset   Heart disease Father 42       CAD   Cancer Brother 78       liver CA mets to brain    Cancer Brother        colon Ca , stomach CA   Social History   Socioeconomic History   Marital status: Married    Spouse name: Not on file   Number of children: Not on file   Years of education: Not on file   Highest  education level: Not on file  Occupational History   Occupation: retired Optometrist     Comment: travels  to [place] weekly   Tobacco Use   Smoking status: Never   Smokeless tobacco: Never  Vaping Use   Vaping Use: Never used  Substance and Sexual Activity   Alcohol use: Yes    Alcohol/week: 14.0 standard drinks of alcohol    Types: 5 Cans of beer, 4 Shots of liquor, 5 Glasses of wine per week    Comment:     Drug use: No   Sexual activity: Not Currently  Other Topics Concern   Not on file  Social History Narrative   Not on file   Social Determinants of Health   Financial Resource Strain: Low Risk  (10/19/2021)  Overall Financial Resource Strain (CARDIA)    Difficulty of Paying Living Expenses: Not hard at all  Food Insecurity: No Food Insecurity (10/19/2021)   Hunger Vital Sign    Worried About Running Out of Food in the Last Year: Never true    Ran Out of Food in the Last Year: Never true  Transportation Needs: No Transportation Needs (10/19/2021)   PRAPARE - Hydrologist (Medical): No    Lack of Transportation (Non-Medical): No  Physical Activity: Sufficiently Active (09/07/2020)   Exercise Vital Sign    Days of Exercise per Week: 3 days    Minutes of Exercise per Session: 60 min  Stress: No Stress Concern Present (10/19/2021)   [place]    Feeling of Stress : Not at all  Social Connections: Unknown (10/19/2021)   Social Connection and Isolation Panel [NHANES]    Frequency of Communication with Friends and Family: More than three times a week    Frequency of Social Gatherings with Friends and Family: More than three times a week    Attends Religious Services: Not on Advertising copywriter or Organizations: Not on file    Attends Archivist Meetings: Not on file    Marital Status: Not on file    Tobacco Counseling Counseling given: Not Answered  Clinical  Intake: Pre-visit preparation completed: Yes        Diabetes: No  How often do you need to have someone help you when you read instructions, pamphlets, or other written materials from your doctor or pharmacy?: 1 - Never   Interpreter Needed?: No    Activities of Daily Living    10/19/2021   11:15 AM  In your present state of health, do you have any difficulty performing the following activities:  Hearing? 0  Vision? 0  Difficulty concentrating or making decisions? 0  Walking or climbing stairs? 0  Dressing or bathing? 0  Doing errands, shopping? 0  Preparing Food and eating ? N  Using the Toilet? N  In the past six months, have you accidently leaked urine? N  Do you have problems with loss of bowel control? N  Managing your Medications? N  Managing your Finances? N  Housekeeping or managing your Housekeeping? N    Patient Care Team: Crecencio Mc, MD as PCP - General (Internal Medicine)  Indicate any recent Medical Services you may have received from other than [facility] providers in the past year (date may be approximate).     Assessment:   This is a routine wellness examination for Nimesh.  Virtual Visit via Telephone Note  I connected with  Charles Mccann on 10/19/21 at 11:00 AM EDT by telephone and verified that I am speaking with the correct person using two identifiers.  Location: Patient: home Provider: office Persons participating in the virtual visit: patient/Nurse Health Advisor   I discussed the limitations of performing an evaluation and management service by telehealth. We continued and completed visit with audio only. Some vital signs may be absent or patient reported.   Hearing/Vision screen Hearing Screening - Comments:: Patient is able to hear conversational tones without difficulty. No issues reported. Vision Screening - Comments:: Followed by [facility] Wears corrective lenses  They have seen their ophthalmologist in the last 12  months.  Dietary issues and exercise activities discussed: Current Exercise Habits: Home exercise routine, Type of exercise: walking (golf),  Intensity: Moderate Healthy diet Good water intake   Goals Addressed               This Visit's Progress     Patient Stated     Follow up with Primary Care Provider (pt-stated)        Routine scheduled appointments       Depression Screen    10/19/2021   11:15 AM 09/28/2021    9:57 AM 06/01/2021   10:46 AM 12/01/2020   10:14 AM 09/07/2020   11:24 AM 05/31/2020   10:09 AM 09/05/2019   11:16 AM  PHQ 2/9 Scores  PHQ - 2 Score 0 0 0 0 0 0 0  PHQ- 9 Score      1     Fall Risk    10/19/2021   11:15 AM 09/28/2021    9:57 AM 06/01/2021   10:46 AM 12/01/2020   10:14 AM 09/07/2020   11:24 AM  Fall Risk   Falls in the past year? 0 0 0 0 0  Number falls in past yr: 0 0     Injury with Fall? 0 0   0  Risk for fall due to :  No Fall Risks No Fall Risks No Fall Risks   Follow up Falls evaluation completed Falls evaluation completed Falls evaluation completed Falls evaluation completed     FALL RISK PREVENTION PERTAINING TO THE HOME: Home free of loose throw rugs in walkways, pet beds, electrical cords, etc? Yes  Adequate lighting in your home to reduce risk of falls? Yes   ASSISTIVE DEVICES UTILIZED TO PREVENT FALLS: Life alert? No  Use of a cane, walker or w/c? No   TIMED UP AND GO: Was the test performed? No .   Cognitive Function:  Patient is alert and oriented x3.  Manages his own finances and medications without difficulty.  100% independent.       10/19/2021    1:12 PM 09/04/2018   11:23 AM  6CIT Screen  What Year? 0 points 0 points  What month? 0 points 0 points  What time? 0 points 0 points  Count back from 20  0 points  Months in reverse  0 points  Repeat phrase  0 points  Total Score  0 points    Immunizations Immunization History  Administered Date(s) Administered   Fluad Quad(high Dose 65+) 11/14/2018    Influenza Inj Mdck Quad Pf 11/17/2015   Influenza Split 11/27/2011   Influenza-Unspecified 11/25/2012, 11/23/2013, 12/26/2016, 11/20/2017, 11/17/2019, 11/01/2020   Moderna SARS-COV2 Booster Vaccination 04/01/2020   Moderna Sars-Covid-2 Vaccination 02/25/2019, 03/25/2019, 09/21/2019, 09/08/2020   Pneumococcal Conjugate-13 04/24/2014, 11/20/2017   Pneumococcal Polysaccharide-23 11/27/2011, 12/01/2019   Tdap 01/09/2013   Zoster Recombinat (Shingrix) 12/10/2019, 03/16/2020   Zoster, Live 11/25/2012    Flu Vaccine status: Due, Education has been provided regarding the importance of this vaccine. Advised may receive this vaccine at local pharmacy or Health Dept. Aware to provide a copy of the vaccination record if obtained from local pharmacy or Health Dept. Verbalized acceptance and understanding.  Covid-19 vaccine status: Completed vaccines x5.  Screening Tests Health Maintenance  Topic Date Due   COVID-19 Vaccine (5 - Moderna risk series) 11/04/2021 (Originally 11/03/2020)   INFLUENZA VACCINE  05/07/2022 (Originally 09/06/2021)   TETANUS/TDAP  01/10/2023   COLONOSCOPY (Pts 45-78yr Insurance coverage will need to be confirmed)  06/16/2024   Pneumonia Vaccine 70 Years old  Completed   Hepatitis C Screening  Completed   Zoster Vaccines- Shingrix  Completed   HPV VACCINES  Aged Out   Health Maintenance There are no preventive care reminders to display for this patient.  Lung Cancer Screening: (Low Dose CT Chest recommended if Age 28-80 years, 30 pack-year currently smoking OR have quit w/in 15years.) does not qualify.   Vision Screening: Recommended annual ophthalmology exams for early detection of glaucoma and other disorders of the eye.  Dental Screening: Recommended annual dental exams for proper oral hygiene  Community Resource Referral / Chronic Care Management: CRR required this visit?  No   CCM required this visit?  No      Plan:     I have personally reviewed and noted  the following in the patient's chart:   Medical and social history Use of alcohol, tobacco or illicit drugs  Current medications and supplements including opioid prescriptions. Patient is not currently taking opioid prescriptions. Functional ability and status Nutritional status Physical activity Advanced directives List of other physicians Hospitalizations, surgeries, and ER visits in previous 12 months Vitals Screenings to include cognitive, depression, and falls Referrals and appointments  In addition, I have reviewed and discussed with patient certain preventive protocols, quality metrics, and best practice recommendations. A written personalized care plan for preventive services as well as general preventive health recommendations were provided to patient.     Varney Biles, LPN   [id number]/[id number]

## 2021-10-19 NOTE — Telephone Encounter (Signed)
Patient reports still having cold symptoms of nasal congestion and some cough with clear to white phlegm. Requests another antibiotic. Follow up appointment offered to schedule with PCP, patient declines. Notes he will follow up with Pulmonary.

## 2021-10-21 DIAGNOSIS — G4733 Obstructive sleep apnea (adult) (pediatric): Secondary | ICD-10-CM | POA: Diagnosis not present

## 2021-10-31 DIAGNOSIS — K146 Glossodynia: Secondary | ICD-10-CM | POA: Diagnosis not present

## 2021-10-31 DIAGNOSIS — J4531 Mild persistent asthma with (acute) exacerbation: Secondary | ICD-10-CM | POA: Diagnosis not present

## 2021-10-31 DIAGNOSIS — J301 Allergic rhinitis due to pollen: Secondary | ICD-10-CM | POA: Diagnosis not present

## 2021-11-17 ENCOUNTER — Other Ambulatory Visit: Payer: Self-pay | Admitting: Internal Medicine

## 2021-12-01 ENCOUNTER — Ambulatory Visit (INDEPENDENT_AMBULATORY_CARE_PROVIDER_SITE_OTHER): Payer: Medicare Other | Admitting: Internal Medicine

## 2021-12-01 ENCOUNTER — Encounter: Payer: Self-pay | Admitting: Internal Medicine

## 2021-12-01 VITALS — BP 123/75 | HR 63 | Temp 97.7°F | Ht 72.0 in | Wt 225.2 lb

## 2021-12-01 DIAGNOSIS — Z0001 Encounter for general adult medical examination with abnormal findings: Secondary | ICD-10-CM | POA: Diagnosis not present

## 2021-12-01 DIAGNOSIS — Z79899 Other long term (current) drug therapy: Secondary | ICD-10-CM | POA: Diagnosis not present

## 2021-12-01 DIAGNOSIS — E1159 Type 2 diabetes mellitus with other circulatory complications: Secondary | ICD-10-CM

## 2021-12-01 DIAGNOSIS — I10 Essential (primary) hypertension: Secondary | ICD-10-CM

## 2021-12-01 DIAGNOSIS — Z125 Encounter for screening for malignant neoplasm of prostate: Secondary | ICD-10-CM

## 2021-12-01 DIAGNOSIS — I152 Hypertension secondary to endocrine disorders: Secondary | ICD-10-CM

## 2021-12-01 DIAGNOSIS — D699 Hemorrhagic condition, unspecified: Secondary | ICD-10-CM

## 2021-12-01 DIAGNOSIS — E1169 Type 2 diabetes mellitus with other specified complication: Secondary | ICD-10-CM

## 2021-12-01 DIAGNOSIS — E669 Obesity, unspecified: Secondary | ICD-10-CM

## 2021-12-01 DIAGNOSIS — R7303 Prediabetes: Secondary | ICD-10-CM

## 2021-12-01 DIAGNOSIS — Z0000 Encounter for general adult medical examination without abnormal findings: Secondary | ICD-10-CM

## 2021-12-01 DIAGNOSIS — E785 Hyperlipidemia, unspecified: Secondary | ICD-10-CM

## 2021-12-01 LAB — CBC WITH DIFFERENTIAL/PLATELET
Basophils Absolute: 0.1 10*3/uL (ref 0.0–0.1)
Basophils Relative: 0.9 % (ref 0.0–3.0)
Eosinophils Absolute: 0.3 10*3/uL (ref 0.0–0.7)
Eosinophils Relative: 3.8 % (ref 0.0–5.0)
HCT: 43.4 % (ref 39.0–52.0)
Hemoglobin: 14.3 g/dL (ref 13.0–17.0)
Lymphocytes Relative: 23.7 % (ref 12.0–46.0)
Lymphs Abs: 1.9 10*3/uL (ref 0.7–4.0)
MCHC: 32.9 g/dL (ref 30.0–36.0)
MCV: 94 fl (ref 78.0–100.0)
Monocytes Absolute: 0.9 10*3/uL (ref 0.1–1.0)
Monocytes Relative: 11.4 % (ref 3.0–12.0)
Neutro Abs: 4.9 10*3/uL (ref 1.4–7.7)
Neutrophils Relative %: 60.2 % (ref 43.0–77.0)
Platelets: 216 10*3/uL (ref 150.0–400.0)
RBC: 4.62 Mil/uL (ref 4.22–5.81)
RDW: 13.6 % (ref 11.5–15.5)
WBC: 8.2 10*3/uL (ref 4.0–10.5)

## 2021-12-01 LAB — COMPREHENSIVE METABOLIC PANEL
ALT: 22 U/L (ref 0–53)
AST: 17 U/L (ref 0–37)
Albumin: 4.1 g/dL (ref 3.5–5.2)
Alkaline Phosphatase: 53 U/L (ref 39–117)
BUN: 21 mg/dL (ref 6–23)
CO2: 30 mEq/L (ref 19–32)
Calcium: 9.5 mg/dL (ref 8.4–10.5)
Chloride: 103 mEq/L (ref 96–112)
Creatinine, Ser: 1.13 mg/dL (ref 0.40–1.50)
GFR: 65.91 mL/min (ref >60.00)
Glucose, Bld: 93 mg/dL (ref 70–99)
Potassium: 3.6 mEq/L (ref 3.5–5.1)
Sodium: 141 mEq/L (ref 135–145)
Total Bilirubin: 1 mg/dL (ref 0.2–1.2)
Total Protein: 6.4 g/dL (ref 6.0–8.3)

## 2021-12-01 LAB — MICROALBUMIN / CREATININE URINE RATIO
Creatinine,U: 26.1 mg/dL
Microalb Creat Ratio: 2.7 mg/g (ref 0.0–30.0)
Microalb, Ur: <0.7 mg/dL (ref 0.0–1.9)

## 2021-12-01 LAB — LIPID PANEL
Cholesterol: 142 mg/dL (ref 0–200)
HDL: 48.2 mg/dL (ref >39.00)
LDL Cholesterol: 72 mg/dL (ref 0–99)
NonHDL: 94.24
Total CHOL/HDL Ratio: 3
Triglycerides: 113 mg/dL (ref 0.0–149.0)
VLDL: 22.6 mg/dL (ref 0.0–40.0)

## 2021-12-01 LAB — APTT: aPTT: 30.4 s (ref 25.4–36.8)

## 2021-12-01 LAB — PSA, MEDICARE: PSA: 1.03 ng/ml (ref 0.10–4.00)

## 2021-12-01 LAB — HEMOGLOBIN A1C: Hgb A1c MFr Bld: 6.5 % (ref 4.6–6.5)

## 2021-12-01 LAB — TSH: TSH: 2.45 u[IU]/mL (ref 0.35–5.50)

## 2021-12-01 LAB — LDL CHOLESTEROL, DIRECT: Direct LDL: 80 mg/dL

## 2021-12-01 NOTE — Assessment & Plan Note (Signed)
Managed with atorvastatin , LDL is 80  No changes today  Lab Results  Component Value Date   CHOL 142 12/01/2021   HDL 48.20 12/01/2021   LDLCALC 72 12/01/2021   LDLDIRECT 80.0 12/01/2021   TRIG 113.0 12/01/2021   CHOLHDL 3 12/01/2021   Lab Results  Component Value Date   ALT 22 12/01/2021   AST 17 12/01/2021   ALKPHOS 53 12/01/2021   BILITOT 1.0 12/01/2021

## 2021-12-01 NOTE — Progress Notes (Signed)
Patient ID: Charles Mccann, male    DOB: 1950-10-15  Age: 71 y.o. MRN: [id number]  The patient is here for annual  preventive examination , follow up on prediabetes, hyperlipidemia,  and COPD .  He has no  acute problems.   The risk factors are reflected in the social history.  The roster of all physicians providing medical care to patient - is listed in the Snapshot section of the chart.  Activities of daily living:  The patient is 100% independent in all ADLs: dressing, toileting, feeding as well as independent mobility  Home safety : The patient has smoke detectors in the home. They wear seatbelts.  There are no firearms at home. There is no violence in the home.   There is no risks for hepatitis, STDs or HIV. There is no   history of blood transfusion. They have no travel history to infectious disease endemic areas of the world.  The patient has seen their dentist in the last six month. They have seen their eye doctor in the last year. They admit to slight hearing difficulty with regard to whispered voices and some television programs.  They have deferred audiologic testing in the last year.  They do not  have excessive sun exposure. Discussed the need for sun protection: hats, long sleeves and use of sunscreen if there is significant sun exposure.   Diet: the importance of a healthy diet is discussed. They do have a healthy diet.  The benefits of regular aerobic exercise were discussed. She walks 4 times per week ,  20 minutes.   Depression screen: there are no signs or vegative symptoms of depression- irritability, change in appetite, anhedonia, sadness/tearfullness.  Cognitive assessment: the patient manages all their financial and personal affairs and is actively engaged. They could relate day,date,year and events; recalled 2/3 objects at 3 minutes; performed clock-face test normally.  The following portions of the patient's history were reviewed and updated as appropriate: allergies,  current medications, past family history, past medical history,  past surgical history, past social history  and problem list.  Visual acuity was not assessed per patient preference since she has regular follow up with her ophthalmologist. Hearing and body mass index were assessed and reviewed.   During the course of the visit the patient was educated and counseled about appropriate screening and preventive services including : fall prevention , diabetes screening, nutrition counseling, colorectal cancer screening, and recommended immunizations.    CC: The primary encounter diagnosis was Encounter for preventive health examination. Diagnoses of Primary hypertension, Hyperlipidemia with target LDL less than 100, Prediabetes, Prostate cancer screening, Long-term use of high-risk medication, and Bleeds easily [facility]) were also pertinent to this visit.  1) prolonged cough following a bronchitis in August.   2) Diagnosed by sleep study. he is wearing her CPAP every night a minimum of 6 hours per night and notes improved daytime wakefulness and decreased fatigue  . Managed by Charles Mccann   3)   History Noeh has a past medical history of Arthritis, Asthma, Bronchitis, chronic obstructive, with exacerbation ([place]) (04/26/2014), COPD (chronic obstructive pulmonary disease) ([place]), GERD (gastroesophageal reflux disease), Heart murmur, Hypercholesterolemia, Hyperlipidemia with target LDL less than 100 (05/28/2011), Hypertension, Impotence due to erectile dysfunction (04/26/2014), Left inguinal hernia (04/07/2016), Obstructive sleep apnea of adult (2012), Palpitations (11/27/2011), Tetralogy of Fallot, Thoracic aortic aneurysm ([place]), Treadmill stress test negative for angina pectoris (3235), and Umbilical hernia without obstruction and without gangrene (03/10/2016).   He has a past surgical  history that includes Tetralogy of Fallot repair [phone]); Cataract extraction w/PHACO (Right, 11/16/2014); Eye surgery; Tonsillectomy;  Cardiac catheterization; Umbilical hernia repair (N/A, 04/20/2016); Inguinal hernia repair (Left, 04/20/2016); Cataract extraction w/PHACO (Left, 02/21/2017); and Colonoscopy with propofol (N/A, 06/17/2019).   His family history includes Cancer in his brother; Cancer (age of onset: 53) in his brother; Heart disease (age of onset: 30) in his father.He reports that he has never smoked. He has never used smokeless tobacco. He reports current alcohol use of about 14.0 standard drinks of alcohol per week. He reports that he does not use drugs.  Outpatient Medications Prior to Visit  Medication Sig Dispense Refill   albuterol (PROVENTIL HFA;VENTOLIN HFA) 108 (90 BASE) MCG/ACT inhaler Inhale 2 puffs into the lungs every 6 (six) hours as needed.     atorvastatin (LIPITOR) 20 MG tablet TAKE 1 TABLET BY MOUTH DAILY. 90 tablet 3   azelastine (ASTELIN) 0.1 % nasal spray Place into the nose.     budesonide-formoterol (SYMBICORT) 160-4.5 MCG/ACT inhaler Inhale 2 puffs into the lungs 2 (two) times daily.     celecoxib (CELEBREX) 200 MG capsule Take 200 mg by mouth 2 (two) times daily as needed.     Cholecalciferol (VITAMIN D) 2000 units CAPS Take 2,000 Units by mouth every morning.      fluticasone (FLONASE) 50 MCG/ACT nasal spray Place 2 sprays into both nostrils at bedtime.      hydrochlorothiazide (HYDRODIURIL) 25 MG tablet TAKE ONE TABLET BY MOUTH EVERY DAY 90 tablet 3   metoprolol succinate (TOPROL-XL) 25 MG 24 hr tablet Take 25 mg by mouth daily.     montelukast (SINGULAIR) 10 MG tablet Take 10 mg by mouth at bedtime.      omeprazole (PRILOSEC) 40 MG capsule Take by mouth.     Potassium 99 MG TABS Take 2 tablets by mouth daily.     predniSONE (DELTASONE) 10 MG tablet Take 5 mg by mouth.      Turmeric 400 MG CAPS Take 1 capsule by mouth daily.     amoxicillin (AMOXIL) 500 MG capsule SMARTSIG:4 Capsule(s) By Mouth (Patient not taking: Reported on 12/01/2021)     amoxicillin-clavulanate (AUGMENTIN) 875-125 MG  tablet Take 1 tablet by mouth 2 (two) times daily. (Patient not taking: Reported on 12/01/2021) 14 tablet 0   benzonatate (TESSALON) 200 MG capsule Take 1 capsule (200 mg total) by mouth 3 (three) times daily as needed for cough. (Patient not taking: Reported on 12/01/2021) 30 capsule 1   guaiFENesin-codeine (CHERATUSSIN AC) 100-10 MG/5ML syrup Take 5 mLs by mouth 3 (three) times daily as needed for cough. (Patient not taking: Reported on 12/01/2021) 120 mL 0   predniSONE (DELTASONE) 10 MG tablet 6 tablets daily for 3 days, then reduce by 1 tablet daily until gone (Patient not taking: Reported on 12/01/2021) 33 tablet 0   predniSONE (DELTASONE) 20 MG tablet Take 1 tablet (20 mg total) by mouth daily with breakfast. (Patient not taking: Reported on 12/01/2021) 5 tablet 0   No facility-administered medications prior to visit.    Review of Systems  Objective:  BP 123/75   Pulse 63   Temp 97.7 F (36.5 C) (Oral)   Ht 6' (1.829 m)   Wt 225 lb 3.2 oz (102.2 kg)   SpO2 99%   BMI 30.54 kg/m   Physical Exam  Physical Exam   Assessment & Plan:   Problem List Items Addressed This Visit     Hyperlipidemia with target LDL less than 100  Managed with atorvastatin , LDL is 80  No changes today  Lab Results  Component Value Date   CHOL 142 12/01/2021   HDL 48.20 12/01/2021   LDLCALC 72 12/01/2021   LDLDIRECT 80.0 12/01/2021   TRIG 113.0 12/01/2021   CHOLHDL 3 12/01/2021   Lab Results  Component Value Date   ALT 22 12/01/2021   AST 17 12/01/2021   ALKPHOS 53 12/01/2021   BILITOT 1.0 12/01/2021          Relevant Orders   Lipid Profile (Completed)   Direct LDL (Completed)   Encounter for preventive health examination - Primary    age appropriate education and counseling updated, referrals for preventative services and immunizations addressed, dietary and smoking counseling addressed, most recent labs reviewed.  I have personally reviewed and have noted:   1) the patient's  medical and social history 2) The pt's use of alcohol, tobacco, and illicit drugs 3) The patient's current medications and supplements 4) Functional ability including ADL's, fall risk, home safety risk, hearing and visual impairment 5) Diet and physical activities 6) Evidence for depression or mood disorder 7) The patient's height, weight, and BMI have been recorded in the chart.     I have made referrals, and provided counseling and education based on review of the above      Hypertension   Relevant Orders   Urine Microalbumin w/creat. ratio (Completed)   Comp Met (CMET) (Completed)   Obesity, diabetes, and hypertension syndrome (HCC)    a1c has risen to 6.5 and is now diagnostic of type 2 DM  He has gained weight . He is taking a statin an d has no proteinuria.  Will have him return for counselling  Lab Results  Component Value Date   HGBA1C 6.5 12/01/2021   Lab Results  Component Value Date   MICROALBUR <0.7 12/01/2021   MICROALBUR <0.7 12/01/2020           Other Visit Diagnoses     Prostate cancer screening       Relevant Orders   PSA, Medicare ( Charles Mccann only) (Completed)   Long-term use of high-risk medication       Relevant Orders   TSH (Completed)   Comprehensive metabolic panel   Bleeds easily ([place])       Relevant Orders   CBC with Differential/Platelet (Completed)   PTT (Completed)       I have discontinued Charles Mccann "Charles Mccann"'s amoxicillin-clavulanate, benzonatate, and Cheratussin AC. I am also having him maintain his fluticasone, albuterol, budesonide-formoterol, montelukast, Vitamin D, azelastine, omeprazole, predniSONE, Potassium, celecoxib, metoprolol succinate, atorvastatin, Turmeric, hydrochlorothiazide, and amoxicillin.  No orders of the defined types were placed in this encounter.   Medications Discontinued During This Encounter  Medication Reason   amoxicillin-clavulanate (AUGMENTIN) 875-125 MG tablet Completed Course    benzonatate (TESSALON) 200 MG capsule    guaiFENesin-codeine (CHERATUSSIN AC) 100-10 MG/5ML syrup    predniSONE (DELTASONE) 10 MG tablet    predniSONE (DELTASONE) 20 MG tablet     Follow-up: Return in about 6 months (around 06/02/2022).   Crecencio Mc, MD

## 2021-12-01 NOTE — Assessment & Plan Note (Signed)

## 2021-12-01 NOTE — Assessment & Plan Note (Addendum)
a1c has risen to 6.5 and is now diagnostic of type 2 DM  He has gained weight . He is taking a statin an d has no proteinuria.  Will have him return for counselling  Lab Results  Component Value Date   HGBA1C 6.5 12/01/2021   Lab Results  Component Value Date   MICROALBUR <0.7 12/01/2021   MICROALBUR <0.7 12/01/2020

## 2021-12-07 DIAGNOSIS — I7121 Aneurysm of the ascending aorta, without rupture: Secondary | ICD-10-CM | POA: Diagnosis not present

## 2021-12-07 DIAGNOSIS — Z8774 Personal history of (corrected) congenital malformations of heart and circulatory system: Secondary | ICD-10-CM | POA: Diagnosis not present

## 2021-12-19 DIAGNOSIS — L570 Actinic keratosis: Secondary | ICD-10-CM | POA: Diagnosis not present

## 2021-12-19 DIAGNOSIS — C44319 Basal cell carcinoma of skin of other parts of face: Secondary | ICD-10-CM | POA: Diagnosis not present

## 2021-12-19 DIAGNOSIS — L820 Inflamed seborrheic keratosis: Secondary | ICD-10-CM | POA: Diagnosis not present

## 2021-12-21 DIAGNOSIS — I7121 Aneurysm of the ascending aorta, without rupture: Secondary | ICD-10-CM | POA: Diagnosis not present

## 2021-12-21 DIAGNOSIS — Q213 Tetralogy of Fallot: Secondary | ICD-10-CM | POA: Diagnosis not present

## 2021-12-21 DIAGNOSIS — I7789 Other specified disorders of arteries and arterioles: Secondary | ICD-10-CM | POA: Diagnosis not present

## 2021-12-21 DIAGNOSIS — I253 Aneurysm of heart: Secondary | ICD-10-CM | POA: Diagnosis not present

## 2021-12-21 DIAGNOSIS — Z8774 Personal history of (corrected) congenital malformations of heart and circulatory system: Secondary | ICD-10-CM | POA: Diagnosis not present

## 2021-12-22 DIAGNOSIS — I7121 Aneurysm of the ascending aorta, without rupture: Secondary | ICD-10-CM | POA: Diagnosis not present

## 2021-12-22 DIAGNOSIS — I77810 Thoracic aortic ectasia: Secondary | ICD-10-CM | POA: Diagnosis not present

## 2021-12-22 DIAGNOSIS — J984 Other disorders of lung: Secondary | ICD-10-CM | POA: Diagnosis not present

## 2021-12-22 DIAGNOSIS — I352 Nonrheumatic aortic (valve) stenosis with insufficiency: Secondary | ICD-10-CM | POA: Diagnosis not present

## 2021-12-22 DIAGNOSIS — Z8774 Personal history of (corrected) congenital malformations of heart and circulatory system: Secondary | ICD-10-CM | POA: Diagnosis not present

## 2022-01-09 DIAGNOSIS — Z961 Presence of intraocular lens: Secondary | ICD-10-CM | POA: Diagnosis not present

## 2022-01-12 ENCOUNTER — Telehealth: Payer: Medicare Other | Admitting: Physician Assistant

## 2022-01-12 DIAGNOSIS — J441 Chronic obstructive pulmonary disease with (acute) exacerbation: Secondary | ICD-10-CM

## 2022-01-12 DIAGNOSIS — J4489 Other specified chronic obstructive pulmonary disease: Secondary | ICD-10-CM

## 2022-01-12 MED ORDER — DOXYCYCLINE HYCLATE 100 MG PO TABS: 100.0000 mg | ORAL_TABLET | Freq: Two times a day (BID) | ORAL | 0 refills | Status: DC

## 2022-01-12 MED ORDER — PREDNISONE 20 MG PO TABS: 40.0000 mg | ORAL_TABLET | Freq: Every day | ORAL | 0 refills | Status: DC

## 2022-01-12 MED ORDER — BENZONATATE 100 MG PO CAPS: 100.0000 mg | ORAL_CAPSULE | Freq: Three times a day (TID) | ORAL | 0 refills | Status: DC | PRN

## 2022-01-12 NOTE — Progress Notes (Signed)
Visit for Asthma  Based on what you have shared with me, it looks like you may have a flare up of your asthma.  Asthma is a chronic (ongoing) lung disease which results in airway obstruction, inflammation and hyper-responsiveness.   Asthma symptoms vary from person to person, with common symptoms including nighttime awakening and decreased ability to participate in normal activities as a result of shortness of breath. It is often triggered by changes in weather, changes in the season, changes in air temperature, or inside (home, school, daycare or work) allergens such as animal dander, mold, mildew, woodstoves or cockroaches.   It can also be triggered by hormonal changes, extreme emotion, physical exertion or an upper respiratory tract illness.     It is important to identify the trigger, and then eliminate or avoid the trigger if possible.   If you have been prescribed medications to be taken on a regular basis, it is important to follow the asthma action plan and to follow guidelines to adjust medication in response to increasing symptoms of decreased peak expiratory flow rate  Treatment: I have prescribed: Prednisone '40mg'$  by mouth per day for 5 - 7 days, and a prescription cough medication. Giving history of COPD and increased risk of bacterial infection, I have sent in an antibiotic to take as well.   HOME CARE Only take medications as instructed by your medical team. Consider wearing a mask or scarf to improve breathing air temperature have been shown to decrease irritation and decrease exacerbations Get rest. Taking a steamy shower or using a humidifier may help nasal congestion sand ease sore throat pain. You can place a towel over your head and breathe in the steam from hot water coming from a faucet. Using a saline nasal spray works much the same way.  Cough drops, hare candies  and sore throat lozenges may ease your cough.  Avoid close contacts especially the very you and the elderly Cover your mouth if you cough or sneeze Always remember to wash your hands.    GET HELP RIGHT AWAY IF: You develop worsening symptoms; breathlessness at rest, drowsy, confused or agitated, unable to speak in full sentences You have coughing fits You develop a severe headache or visual changes You develop shortness of breath, difficulty breathing or start having chest pain Your symptoms persist after you have completed your treatment plan If your symptoms do not improve within 10 days  MAKE SURE YOU Understand these instructions. Will watch your condition. Will get help right away if you are not doing well or get worse.   Your e-visit answers were reviewed by a board certified advanced clinical practitioner to complete your personal care plan, Depending upon the condition, your plan could have included both over the counter or prescription medications.   Please review your pharmacy choice. Your safety is important to [place]. If you have drug allergies check your prescription carefully.  You can use MyChart to ask questions about today's visit, request a non-urgent  call back, or ask for a work or school excuse for 24 hours related to this e-Visit. If it has been greater than 24 hours you will need to follow up with your provider, or enter a new e-Visit to address those concerns.   You will get an e-mail in the next two days asking about your experience. I hope that your e-visit has been valuable and will speed your recovery. Thank you for using e-visits.

## 2022-01-12 NOTE — Progress Notes (Signed)
I have spent 5 minutes in review of e-visit questionnaire, review and updating patient chart, medical decision making and response to patient.   [name] [name], PA-C    

## 2022-01-31 ENCOUNTER — Other Ambulatory Visit: Payer: Self-pay | Admitting: Internal Medicine

## 2022-02-20 ENCOUNTER — Encounter: Payer: Self-pay | Admitting: Internal Medicine

## 2022-02-20 MED ORDER — CELECOXIB 200 MG PO CAPS: 200.0000 mg | ORAL_CAPSULE | Freq: Two times a day (BID) | ORAL | 0 refills | Status: DC | PRN

## 2022-02-23 DIAGNOSIS — Z96653 Presence of artificial knee joint, bilateral: Secondary | ICD-10-CM | POA: Diagnosis not present

## 2022-02-28 DIAGNOSIS — R69 Illness, unspecified: Secondary | ICD-10-CM | POA: Diagnosis not present

## 2022-03-20 DIAGNOSIS — J4530 Mild persistent asthma, uncomplicated: Secondary | ICD-10-CM | POA: Diagnosis not present

## 2022-03-20 DIAGNOSIS — G4733 Obstructive sleep apnea (adult) (pediatric): Secondary | ICD-10-CM | POA: Diagnosis not present

## 2022-04-07 DIAGNOSIS — G4733 Obstructive sleep apnea (adult) (pediatric): Secondary | ICD-10-CM | POA: Diagnosis not present

## 2022-04-22 DIAGNOSIS — G4733 Obstructive sleep apnea (adult) (pediatric): Secondary | ICD-10-CM | POA: Diagnosis not present

## 2022-05-08 DIAGNOSIS — G4733 Obstructive sleep apnea (adult) (pediatric): Secondary | ICD-10-CM | POA: Diagnosis not present

## 2022-05-23 DIAGNOSIS — G4733 Obstructive sleep apnea (adult) (pediatric): Secondary | ICD-10-CM | POA: Diagnosis not present

## 2022-06-02 ENCOUNTER — Encounter: Payer: Self-pay | Admitting: Internal Medicine

## 2022-06-02 ENCOUNTER — Ambulatory Visit: Payer: Medicare HMO | Admitting: Internal Medicine

## 2022-06-02 ENCOUNTER — Telehealth: Payer: Self-pay

## 2022-06-02 VITALS — BP 138/76 | HR 65 | Temp 98.1°F | Ht 72.0 in | Wt 229.2 lb

## 2022-06-02 DIAGNOSIS — E785 Hyperlipidemia, unspecified: Secondary | ICD-10-CM | POA: Diagnosis not present

## 2022-06-02 DIAGNOSIS — E669 Obesity, unspecified: Secondary | ICD-10-CM

## 2022-06-02 DIAGNOSIS — I152 Hypertension secondary to endocrine disorders: Secondary | ICD-10-CM

## 2022-06-02 DIAGNOSIS — Z7985 Long-term (current) use of injectable non-insulin antidiabetic drugs: Secondary | ICD-10-CM

## 2022-06-02 DIAGNOSIS — M180 Bilateral primary osteoarthritis of first carpometacarpal joints: Secondary | ICD-10-CM

## 2022-06-02 DIAGNOSIS — E1159 Type 2 diabetes mellitus with other circulatory complications: Secondary | ICD-10-CM

## 2022-06-02 DIAGNOSIS — I10 Essential (primary) hypertension: Secondary | ICD-10-CM | POA: Diagnosis not present

## 2022-06-02 DIAGNOSIS — E1169 Type 2 diabetes mellitus with other specified complication: Secondary | ICD-10-CM | POA: Diagnosis not present

## 2022-06-02 LAB — COMPREHENSIVE METABOLIC PANEL
ALT: 17 U/L (ref 0–53)
AST: 17 U/L (ref 0–37)
Albumin: 4 g/dL (ref 3.5–5.2)
Alkaline Phosphatase: 54 U/L (ref 39–117)
BUN: 19 mg/dL (ref 6–23)
CO2: 31 mEq/L (ref 19–32)
Calcium: 9 mg/dL (ref 8.4–10.5)
Chloride: 101 mEq/L (ref 96–112)
Creatinine, Ser: 1.06 mg/dL (ref 0.40–1.50)
GFR: 70.92 mL/min (ref >60.00)
Glucose, Bld: 105 mg/dL — ABNORMAL HIGH (ref 70–99)
Potassium: 3.7 mEq/L (ref 3.5–5.1)
Sodium: 141 mEq/L (ref 135–145)
Total Bilirubin: 0.9 mg/dL (ref 0.2–1.2)
Total Protein: 6.5 g/dL (ref 6.0–8.3)

## 2022-06-02 LAB — HEMOGLOBIN A1C: Hgb A1c MFr Bld: 5.9 % (ref 4.6–6.5)

## 2022-06-02 LAB — LDL CHOLESTEROL, DIRECT: Direct LDL: 75 mg/dL

## 2022-06-02 MED ORDER — SEMAGLUTIDE-WEIGHT MANAGEMENT 0.25 MG/0.5ML ~~LOC~~ SOAJ: 0.2500 mg | SUBCUTANEOUS | 0 refills | Status: DC

## 2022-06-02 MED ORDER — ZEPBOUND 2.5 MG/0.5ML ~~LOC~~ SOAJ: 2.5000 mg | SUBCUTANEOUS | 0 refills | Status: DC

## 2022-06-02 MED ORDER — CELECOXIB 200 MG PO CAPS: 200.0000 mg | ORAL_CAPSULE | Freq: Two times a day (BID) | ORAL | 0 refills | Status: DC | PRN

## 2022-06-02 MED ORDER — TIRZEPATIDE 2.5 MG/0.5ML ~~LOC~~ SOAJ: 2.5000 mg | SUBCUTANEOUS | 2 refills | Status: DC

## 2022-06-02 NOTE — Telephone Encounter (Signed)
PA for Mounjaro is needed. 

## 2022-06-02 NOTE — Patient Instructions (Addendum)
You can add up to 2000 mg of acetominophen (tylenol) every day safely  In divided doses (500 mg every 6 hours  Or 1000 mg every 12 hours.)   Save the celebrex for pain not relived by tylenol    Ok to combine zyrtec am with  benadryl at night   Check BP at home   call if readings are  higher than 130/80  Health[employer] Choice "low carb power bowl"  entrees and  "Steamer" entrees are are great low carb  high protein entrees that microwave in 5 minutes  .  Also find the Zero line.    You might want to try a premixed protein drink called Premier Protein shake for breakfast or late night snack . It is great tasting,   very low sugar and available of < $2 serving at [facility] and  In bulk for $1.50/serving at [employer] and [employer]  .    Nutritional analysis :  160 cal  30 g protein  1 g sugar 50% calcium needs   Nicolette Bang and [employer]    Veggies Made Great ! Makes a low carb spinach & egg white frittata   70 cal

## 2022-06-02 NOTE — Progress Notes (Addendum)
Subjective:  Patient ID: Charles Mccann, male    DOB: January 17, 1951  Age: 72 y.o. MRN: [id number]  CC: The primary encounter diagnosis was Hyperlipidemia with target LDL less than 100. Diagnoses of Obesity, diabetes, and hypertension syndrome (HCC), Primary hypertension, Primary osteoarthritis of both first carpometacarpal joints, and Long-term current use of injectable noninsulin antidiabetic medication were also pertinent to this visit.   HPI Charles Mccann presents for  Chief Complaint  Patient presents with   Medical Management of Chronic Issues    6 month follow up     Shoulder elbows and hands aching despite using celebrex prn   Productive cough chronic, without pleurisy or fevers.    New onset diabetes:    following a low carb diet   exercise limited by joint pain and asthma but golfing several times per week.    Obesity:  .having trouble losing weight despite following a a low GI diet .  not exercising except for golf.       Outpatient Medications Prior to Visit  Medication Sig Dispense Refill   albuterol (PROVENTIL HFA;VENTOLIN HFA) 108 (90 BASE) MCG/ACT inhaler Inhale 2 puffs into the lungs every 6 (six) hours as needed.     atorvastatin (LIPITOR) 20 MG tablet TAKE 1 TABLET BY MOUTH DAILY. 90 tablet 3   azelastine (ASTELIN) 0.1 % nasal spray Place into the nose.     budesonide-formoterol (SYMBICORT) 160-4.5 MCG/ACT inhaler Inhale 2 puffs into the lungs 2 (two) times daily.     Cholecalciferol (VITAMIN D) 2000 units CAPS Take 2,000 Units by mouth every morning.      fluticasone (FLONASE) 50 MCG/ACT nasal spray Place 2 sprays into both nostrils at bedtime.      hydrochlorothiazide (HYDRODIURIL) 25 MG tablet TAKE ONE TABLET BY MOUTH EVERY DAY 90 tablet 3   metoprolol succinate (TOPROL-XL) 25 MG 24 hr tablet Take 25 mg by mouth daily.     montelukast (SINGULAIR) 10 MG tablet Take 10 mg by mouth at bedtime.      omeprazole (PRILOSEC) 40 MG capsule Take by mouth.      Potassium 99 MG TABS Take 2 tablets by mouth daily.     predniSONE (DELTASONE) 10 MG tablet Take 1 tablet by mouth every other day.     Turmeric 400 MG CAPS Take 1 capsule by mouth daily.     celecoxib (CELEBREX) 200 MG capsule Take 1 capsule (200 mg total) by mouth 2 (two) times daily as needed. 180 capsule 0   benzonatate (TESSALON) 100 MG capsule Take 1 capsule (100 mg total) by mouth 3 (three) times daily as needed for cough. 30 capsule 0   doxycycline (VIBRA-TABS) 100 MG tablet Take 1 tablet (100 mg total) by mouth 2 (two) times daily. (Patient not taking: Reported on 06/02/2022) 14 tablet 0   predniSONE (DELTASONE) 20 MG tablet Take 2 tablets (40 mg total) by mouth daily with breakfast. (Patient not taking: Reported on 06/02/2022) 10 tablet 0   No facility-administered medications prior to visit.    Review of Systems;  Patient denies headache, fevers, malaise, unintentional weight loss, skin rash, eye pain, sinus congestion and sinus pain, sore throat, dysphagia,  hemoptysis , cough, dyspnea, wheezing, chest pain, palpitations, orthopnea, edema, abdominal pain, nausea, melena, diarrhea, constipation, flank pain, dysuria, hematuria, urinary  Frequency, nocturia, numbness, tingling, seizures,  Focal weakness, Loss of consciousness,  Tremor, insomnia, depression, anxiety, and suicidal ideation.      Objective:  BP 138/76  Pulse 65   Temp 98.1 F (36.7 C) (Oral)   Ht 6' (1.829 m)   Wt 229 lb 3.2 oz (104 kg)   SpO2 97%   BMI 31.09 kg/m   BP Readings from Last 3 Encounters:  06/02/22 138/76  12/01/21 123/75  06/01/21 122/76    Wt Readings from Last 3 Encounters:  06/02/22 229 lb 3.2 oz (104 kg)  12/01/21 225 lb 3.2 oz (102.2 kg)  10/19/21 212 lb (96.2 kg)    Physical Exam Vitals reviewed.  Constitutional:      General: He is not in acute distress.    Appearance: Normal appearance. He is normal weight. He is not ill-appearing, toxic-appearing or diaphoretic.  HENT:      Head: Normocephalic.  Eyes:     General: No scleral icterus.       Right eye: No discharge.        Left eye: No discharge.     Conjunctiva/sclera: Conjunctivae normal.  Cardiovascular:     Rate and Rhythm: Normal rate and regular rhythm.     Heart sounds: Normal heart sounds.  Pulmonary:     Effort: Pulmonary effort is normal. No respiratory distress.     Breath sounds: Normal breath sounds.  Musculoskeletal:        General: Normal range of motion.     Cervical back: Normal range of motion.  Skin:    General: Skin is warm and dry.  Neurological:     General: No focal deficit present.     Mental Status: He is alert and oriented to person, place, and time. Mental status is at baseline.  Psychiatric:        Mood and Affect: Mood normal.        Behavior: Behavior normal.        Thought Content: Thought content normal.        Judgment: Judgment normal.    Lab Results  Component Value Date   HGBA1C 5.9 06/02/2022   HGBA1C 6.5 12/01/2021   HGBA1C 6.1 06/01/2021    Lab Results  Component Value Date   CREATININE 1.06 06/02/2022   CREATININE 1.13 12/01/2021   CREATININE 1.13 06/01/2021    Lab Results  Component Value Date   WBC 8.2 12/01/2021   HGB 14.3 12/01/2021   HCT 43.4 12/01/2021   PLT 216.0 12/01/2021   GLUCOSE 105 (H) 06/02/2022   CHOL 142 12/01/2021   TRIG 113.0 12/01/2021   HDL 48.20 12/01/2021   LDLDIRECT 75.0 06/02/2022   LDLCALC 72 12/01/2021   ALT 17 06/02/2022   AST 17 06/02/2022   NA 141 06/02/2022   K 3.7 06/02/2022   CL 101 06/02/2022   CREATININE 1.06 06/02/2022   BUN 19 06/02/2022   CO2 31 06/02/2022   TSH 2.45 12/01/2021   PSA 1.03 12/01/2021   HGBA1C 5.9 06/02/2022   MICROALBUR <0.7 12/01/2021    No results found.  Assessment & Plan:  .Hyperlipidemia with target LDL less than 100 -     LDL cholesterol, direct  Obesity, diabetes, and hypertension syndrome (HCC) Assessment & Plan: Diagnosed in October with A1c of 6.5  , with  improved A1c but no weight loss.  Patient has been unable to lose or maintain a healthy weight despite good effort. Encouraged to increase exercise involvement to include more intense aerobic activity for 30 minutes 5 days per week.  Screened for contraindications to use of  GLP 1 agonists for appetite suppression and she has none.  The  risks and benefits of pharmacotherapy discussed and he is requesting a trial of therapy . PA needed for [facility]  Lab Results  Component Value Date   HGBA1C 5.9 06/02/2022     Orders: -     Hemoglobin A1c -     Comprehensive metabolic panel  Primary hypertension Assessment & Plan: he reports compliance with medication regimen  but has an elevated reading today in office.  he is  using NSAIDs daily.  Discussed goal of 120/70  (130/80 for patients over 70)  to preserve renal function.  His most recent home reading is 118/83.  He has been advised to reduce celebrex to once daily at most,  Use tylenol and tramadol  instead,  Recheck his  BP  at home and  submit readings for evaluation. Renal function, electrolytes and screen for proteinuria are all normal   Lab Results  Component Value Date   MICROALBUR <0.7 12/01/2021   MICROALBUR <0.7 12/01/2020     Lab Results  Component Value Date   CREATININE 1.06 06/02/2022   Lab Results  Component Value Date   NA 141 06/02/2022   K 3.7 06/02/2022   CL 101 06/02/2022   CO2 31 06/02/2022      Primary osteoarthritis of both first carpometacarpal joints Assessment & Plan: Advised to maximize tylenol use to 2000 mg and add celebrex for prn use    Long-term current use of injectable noninsulin antidiabetic medication Assessment & Plan: Starting Mounjaro   Other orders -     Celecoxib; Take 1 capsule (200 mg total) by mouth 2 (two) times daily as needed.  Dispense: 180 capsule; Refill: 0 -     Tirzepatide; Inject 2.5 mg into the skin once a week.  Dispense: 2 mL; Refill: 2    Follow-up: No follow-ups  on file.   Sherlene Shams, MD

## 2022-06-02 NOTE — Assessment & Plan Note (Signed)
Diagnosed in October with A1c of 6.5  , with improved A1c but no weight loss.  Patient has been unable to lose or maintain a healthy weight despite good effort. Encouraged to increase exercise involvement to include more intense aerobic activity for 30 minutes 5 days per week.  Screened for contraindications to use of  GLP 1 agonists for appetite suppression and she has none.  The risks and benefits of pharmacotherapy discussed and he is requesting a trial of therapy . PA needed for [facility]  Lab Results  Component Value Date   HGBA1C 5.9 06/02/2022

## 2022-06-04 DIAGNOSIS — Z7985 Long-term (current) use of injectable non-insulin antidiabetic drugs: Secondary | ICD-10-CM | POA: Insufficient documentation

## 2022-06-04 NOTE — Assessment & Plan Note (Signed)
he reports compliance with medication regimen  but has an elevated reading today in office.  he is  using NSAIDs daily.  Discussed goal of 120/70  (130/80 for patients over 70)  to preserve renal function.  His most recent home reading is 118/83.  He has been advised to reduce celebrex to once daily at most,  Use tylenol and tramadol  instead,  Recheck his  BP  at home and  submit readings for evaluation. Renal function, electrolytes and screen for proteinuria are all normal   Lab Results  Component Value Date   MICROALBUR <0.7 12/01/2021   MICROALBUR <0.7 12/01/2020     Lab Results  Component Value Date   CREATININE 1.06 06/02/2022   Lab Results  Component Value Date   NA 141 06/02/2022   K 3.7 06/02/2022   CL 101 06/02/2022   CO2 31 06/02/2022

## 2022-06-04 NOTE — Assessment & Plan Note (Signed)
Starting Mounjaro

## 2022-06-04 NOTE — Assessment & Plan Note (Signed)
Advised to maximize tylenol use to 2000 mg and add celebrex for prn use

## 2022-06-07 DIAGNOSIS — G4733 Obstructive sleep apnea (adult) (pediatric): Secondary | ICD-10-CM | POA: Diagnosis not present

## 2022-06-13 ENCOUNTER — Telehealth: Payer: Self-pay | Admitting: Pharmacy Technician

## 2022-06-13 ENCOUNTER — Encounter: Payer: Self-pay | Admitting: Internal Medicine

## 2022-06-13 NOTE — Telephone Encounter (Signed)
Patient Advocate Encounter   Received notification that prior authorization for Mounjaro 2.5MG /0.5ML pen-injectors is required.   PA submitted on 06/13/2022 [employer] Electronic PA Form Status is pending

## 2022-06-13 NOTE — Telephone Encounter (Signed)
Status of PA in separate encounter 

## 2022-06-16 NOTE — Telephone Encounter (Signed)
PA Approved

## 2022-06-16 NOTE — Telephone Encounter (Signed)
Pt is aware and gave a verbal understanding.  

## 2022-06-19 ENCOUNTER — Other Ambulatory Visit: Payer: Self-pay

## 2022-06-19 DIAGNOSIS — L570 Actinic keratosis: Secondary | ICD-10-CM | POA: Diagnosis not present

## 2022-06-19 DIAGNOSIS — L820 Inflamed seborrheic keratosis: Secondary | ICD-10-CM | POA: Diagnosis not present

## 2022-06-19 DIAGNOSIS — I872 Venous insufficiency (chronic) (peripheral): Secondary | ICD-10-CM | POA: Diagnosis not present

## 2022-06-19 DIAGNOSIS — L814 Other melanin hyperpigmentation: Secondary | ICD-10-CM | POA: Diagnosis not present

## 2022-06-19 DIAGNOSIS — K130 Diseases of lips: Secondary | ICD-10-CM | POA: Diagnosis not present

## 2022-06-19 DIAGNOSIS — Z85828 Personal history of other malignant neoplasm of skin: Secondary | ICD-10-CM | POA: Diagnosis not present

## 2022-06-19 DIAGNOSIS — D1801 Hemangioma of skin and subcutaneous tissue: Secondary | ICD-10-CM | POA: Diagnosis not present

## 2022-06-19 DIAGNOSIS — L821 Other seborrheic keratosis: Secondary | ICD-10-CM | POA: Diagnosis not present

## 2022-06-19 MED ORDER — TIRZEPATIDE 2.5 MG/0.5ML ~~LOC~~ SOAJ: 2.5000 mg | SUBCUTANEOUS | 2 refills | Status: DC

## 2022-06-19 NOTE — Addendum Note (Signed)
Addended by: Sandy Salaam on: 06/19/2022 02:08 PM   Modules accepted: Orders

## 2022-06-20 ENCOUNTER — Other Ambulatory Visit: Payer: Self-pay | Admitting: Internal Medicine

## 2022-06-20 MED ORDER — SEMAGLUTIDE(0.25 OR 0.5MG/DOS) 2 MG/3ML ~~LOC~~ SOPN: 0.2500 mg | PEN_INJECTOR | SUBCUTANEOUS | 2 refills | Status: DC

## 2022-06-20 NOTE — Addendum Note (Signed)
Addended by: Sherlene Shams on: 06/20/2022 05:36 PM   Modules accepted: Orders

## 2022-06-22 ENCOUNTER — Other Ambulatory Visit: Payer: Self-pay | Admitting: Internal Medicine

## 2022-06-22 ENCOUNTER — Encounter: Payer: Self-pay | Admitting: Internal Medicine

## 2022-06-22 DIAGNOSIS — G4733 Obstructive sleep apnea (adult) (pediatric): Secondary | ICD-10-CM | POA: Diagnosis not present

## 2022-06-22 MED ORDER — TIRZEPATIDE 2.5 MG/0.5ML ~~LOC~~ SOAJ: 2.5000 mg | SUBCUTANEOUS | 2 refills | Status: DC

## 2022-06-22 NOTE — Telephone Encounter (Signed)
Pharmacy comment: Alternative Requested:THE PRESCRIBED MEDICATION IS NOT COVERED BY INSURANCE. PLEASE CONSIDER CHANGING TO ONE OF THE SUGGESTED COVERED ALTERNATIVES.

## 2022-07-08 DIAGNOSIS — G4733 Obstructive sleep apnea (adult) (pediatric): Secondary | ICD-10-CM | POA: Diagnosis not present

## 2022-07-17 DIAGNOSIS — S96912A Strain of unspecified muscle and tendon at ankle and foot level, left foot, initial encounter: Secondary | ICD-10-CM | POA: Diagnosis not present

## 2022-07-17 DIAGNOSIS — R262 Difficulty in walking, not elsewhere classified: Secondary | ICD-10-CM | POA: Diagnosis not present

## 2022-07-17 DIAGNOSIS — M7672 Peroneal tendinitis, left leg: Secondary | ICD-10-CM | POA: Diagnosis not present

## 2022-07-17 DIAGNOSIS — M79672 Pain in left foot: Secondary | ICD-10-CM | POA: Diagnosis not present

## 2022-07-18 ENCOUNTER — Encounter: Payer: Self-pay | Admitting: Internal Medicine

## 2022-07-21 DIAGNOSIS — G4733 Obstructive sleep apnea (adult) (pediatric): Secondary | ICD-10-CM | POA: Diagnosis not present

## 2022-08-07 DIAGNOSIS — G4733 Obstructive sleep apnea (adult) (pediatric): Secondary | ICD-10-CM | POA: Diagnosis not present

## 2022-08-16 ENCOUNTER — Other Ambulatory Visit (HOSPITAL_COMMUNITY): Payer: Self-pay

## 2022-08-16 ENCOUNTER — Telehealth: Payer: Self-pay | Admitting: Internal Medicine

## 2022-08-16 MED ORDER — TIRZEPATIDE 5 MG/0.5ML ~~LOC~~ SOAJ: 5.0000 mg | SUBCUTANEOUS | 2 refills | Status: DC

## 2022-08-16 NOTE — Telephone Encounter (Signed)
Spoke to pot and  patient is willing to increase Mounjaro to 5 mg dose

## 2022-08-16 NOTE — Telephone Encounter (Signed)
Charles Mccann from [place] called staiting pt need a PA mounjaro

## 2022-08-16 NOTE — Telephone Encounter (Signed)
Insurance requires patient to titrate up to the next dose until maintenance dose is reached. Ran test claim for 2.5 mg, came back plan limitations exceeded. MAX QTY OF 4.000 IN 365 DAYS. Ran test claim for 5 mg, received a paid claim. Please advise if patient can go up to the next dose or needs to stay on the 2.5 mg. Will have to call insurance to initiate PA for 2.5mg  (Could not process through [facility] due to another PA existing).

## 2022-08-20 DIAGNOSIS — G4733 Obstructive sleep apnea (adult) (pediatric): Secondary | ICD-10-CM | POA: Diagnosis not present

## 2022-08-21 ENCOUNTER — Other Ambulatory Visit (HOSPITAL_COMMUNITY): Payer: Self-pay

## 2022-08-23 DIAGNOSIS — J9811 Atelectasis: Secondary | ICD-10-CM | POA: Diagnosis not present

## 2022-08-23 DIAGNOSIS — J984 Other disorders of lung: Secondary | ICD-10-CM | POA: Diagnosis not present

## 2022-08-23 DIAGNOSIS — Z87898 Personal history of other specified conditions: Secondary | ICD-10-CM | POA: Diagnosis not present

## 2022-08-23 DIAGNOSIS — R059 Cough, unspecified: Secondary | ICD-10-CM | POA: Diagnosis not present

## 2022-08-23 DIAGNOSIS — R062 Wheezing: Secondary | ICD-10-CM | POA: Diagnosis not present

## 2022-08-23 DIAGNOSIS — G4733 Obstructive sleep apnea (adult) (pediatric): Secondary | ICD-10-CM | POA: Diagnosis not present

## 2022-08-23 DIAGNOSIS — J4540 Moderate persistent asthma, uncomplicated: Secondary | ICD-10-CM | POA: Diagnosis not present

## 2022-09-07 DIAGNOSIS — G4733 Obstructive sleep apnea (adult) (pediatric): Secondary | ICD-10-CM | POA: Diagnosis not present

## 2022-09-20 DIAGNOSIS — G4733 Obstructive sleep apnea (adult) (pediatric): Secondary | ICD-10-CM | POA: Diagnosis not present

## 2022-09-21 ENCOUNTER — Encounter (INDEPENDENT_AMBULATORY_CARE_PROVIDER_SITE_OTHER): Payer: Self-pay

## 2022-09-28 DIAGNOSIS — Z96652 Presence of left artificial knee joint: Secondary | ICD-10-CM | POA: Diagnosis not present

## 2022-09-29 ENCOUNTER — Other Ambulatory Visit: Payer: Self-pay | Admitting: Orthopedic Surgery

## 2022-09-29 DIAGNOSIS — Z96652 Presence of left artificial knee joint: Secondary | ICD-10-CM

## 2022-10-04 ENCOUNTER — Ambulatory Visit
Admission: RE | Admit: 2022-10-04 | Discharge: 2022-10-04 | Disposition: A | Discharge status: Home / Self Care | Payer: Medicare HMO | Source: Ambulatory Visit | Attending: Orthopedic Surgery | Admitting: Orthopedic Surgery

## 2022-10-04 DIAGNOSIS — Z96652 Presence of left artificial knee joint: Secondary | ICD-10-CM | POA: Insufficient documentation

## 2022-10-04 DIAGNOSIS — M25462 Effusion, left knee: Secondary | ICD-10-CM | POA: Diagnosis not present

## 2022-10-04 DIAGNOSIS — M25562 Pain in left knee: Secondary | ICD-10-CM | POA: Diagnosis not present

## 2022-10-08 DIAGNOSIS — G4733 Obstructive sleep apnea (adult) (pediatric): Secondary | ICD-10-CM | POA: Diagnosis not present

## 2022-10-16 DIAGNOSIS — I071 Rheumatic tricuspid insufficiency: Secondary | ICD-10-CM | POA: Diagnosis not present

## 2022-10-16 DIAGNOSIS — Q213 Tetralogy of Fallot: Secondary | ICD-10-CM | POA: Diagnosis not present

## 2022-10-16 DIAGNOSIS — Z8774 Personal history of (corrected) congenital malformations of heart and circulatory system: Secondary | ICD-10-CM | POA: Diagnosis not present

## 2022-10-16 DIAGNOSIS — I7121 Aneurysm of the ascending aorta, without rupture: Secondary | ICD-10-CM | POA: Diagnosis not present

## 2022-10-16 DIAGNOSIS — I10 Essential (primary) hypertension: Secondary | ICD-10-CM | POA: Diagnosis not present

## 2022-10-16 DIAGNOSIS — I350 Nonrheumatic aortic (valve) stenosis: Secondary | ICD-10-CM | POA: Diagnosis not present

## 2022-10-16 DIAGNOSIS — I452 Bifascicular block: Secondary | ICD-10-CM | POA: Diagnosis not present

## 2022-10-16 DIAGNOSIS — I700 Atherosclerosis of aorta: Secondary | ICD-10-CM | POA: Diagnosis not present

## 2022-10-16 DIAGNOSIS — R000 Tachycardia, unspecified: Secondary | ICD-10-CM | POA: Diagnosis not present

## 2022-10-23 DIAGNOSIS — I872 Venous insufficiency (chronic) (peripheral): Secondary | ICD-10-CM | POA: Diagnosis not present

## 2022-10-23 DIAGNOSIS — L2489 Irritant contact dermatitis due to other agents: Secondary | ICD-10-CM | POA: Diagnosis not present

## 2022-10-23 DIAGNOSIS — L821 Other seborrheic keratosis: Secondary | ICD-10-CM | POA: Diagnosis not present

## 2022-10-23 DIAGNOSIS — L570 Actinic keratosis: Secondary | ICD-10-CM | POA: Diagnosis not present

## 2022-10-23 DIAGNOSIS — L820 Inflamed seborrheic keratosis: Secondary | ICD-10-CM | POA: Diagnosis not present

## 2022-10-23 DIAGNOSIS — L905 Scar conditions and fibrosis of skin: Secondary | ICD-10-CM | POA: Diagnosis not present

## 2022-10-23 DIAGNOSIS — L814 Other melanin hyperpigmentation: Secondary | ICD-10-CM | POA: Diagnosis not present

## 2022-10-23 DIAGNOSIS — L601 Onycholysis: Secondary | ICD-10-CM | POA: Diagnosis not present

## 2022-10-24 DIAGNOSIS — G4733 Obstructive sleep apnea (adult) (pediatric): Secondary | ICD-10-CM | POA: Diagnosis not present

## 2022-10-25 ENCOUNTER — Ambulatory Visit (INDEPENDENT_AMBULATORY_CARE_PROVIDER_SITE_OTHER): Payer: Medicare HMO | Admitting: *Deleted

## 2022-10-25 VITALS — Ht 72.0 in | Wt 210.0 lb

## 2022-10-25 DIAGNOSIS — Z0000 Encounter for general adult medical examination without abnormal findings: Secondary | ICD-10-CM

## 2022-10-25 NOTE — Progress Notes (Signed)
Subjective:   Charles Mccann is a 72 y.o. male who presents for Medicare Annual/Subsequent preventive examination.  Visit Complete: Virtual  I connected with  Charles Mccann on 10/25/22 by a audio enabled telemedicine application and verified that I am speaking with the correct person using two identifiers.  Patient Location: Home  Provider Location: Office/Clinic  I discussed the limitations of evaluation and management by telemedicine. The patient expressed understanding and agreed to proceed.  Patient Medicare AWV questionnaire was completed by the patient on 10/21/22  Vital Signs: Unable to obtain new vitals due to this being a telehealth visit. ; I have confirmed that all information answered by patient is correct and no changes since this date.  Cardiac Risk Factors include: advanced age (>64men, >82 women);dyslipidemia;male gender;hypertension     Objective:    Today's Vitals   10/25/22 0912 10/25/22 0913  Weight: 210 lb (95.3 kg)   Height: 6' (1.829 m)   PainSc:  4    Body mass index is 28.48 kg/m.     10/25/2022    9:26 AM 10/19/2021   11:16 AM 09/07/2020   11:23 AM 09/05/2019   11:33 AM 09/05/2019   11:15 AM 06/17/2019    8:12 AM 09/04/2018   11:17 AM  Advanced Directives  Does Patient Have a Medical Advance Directive? Yes Yes Yes Yes Yes Yes Yes  Type of Estate agent of [place];Living will Healthcare Power of [place];Living will Healthcare Power of [place];Living will Healthcare Power of [place];Living will Healthcare Power of [place];Living will Healthcare Power of [place];Living will Healthcare Power of [place];Living will  Does patient want to make changes to medical advance directive?  No - Patient declined No - Patient declined No - Patient declined No - Patient declined  No - Patient declined  Copy of Healthcare Power of Attorney in Chart? No - copy requested No - copy requested No - copy requested No - copy requested No - copy  requested No - copy requested No - copy requested    Current Medications (verified) Outpatient Encounter Medications as of 10/25/2022  Medication Sig   albuterol (PROVENTIL HFA;VENTOLIN HFA) 108 (90 BASE) MCG/ACT inhaler Inhale 2 puffs into the lungs every 6 (six) hours as needed.   atorvastatin (LIPITOR) 20 MG tablet TAKE 1 TABLET BY MOUTH DAILY.   azelastine (ASTELIN) 0.1 % nasal spray Place into the nose.   budesonide-formoterol (SYMBICORT) 160-4.5 MCG/ACT inhaler Inhale 2 puffs into the lungs 2 (two) times daily.   celecoxib (CELEBREX) 200 MG capsule Take 1 capsule (200 mg total) by mouth 2 (two) times daily as needed.   Cholecalciferol (VITAMIN D) 2000 units CAPS Take 2,000 Units by mouth every morning.    fluticasone (FLONASE) 50 MCG/ACT nasal spray Place 2 sprays into both nostrils at bedtime.    hydrochlorothiazide (HYDRODIURIL) 25 MG tablet TAKE ONE TABLET BY MOUTH EVERY DAY   metoprolol succinate (TOPROL-XL) 25 MG 24 hr tablet Take 25 mg by mouth daily.   montelukast (SINGULAIR) 10 MG tablet Take 10 mg by mouth at bedtime.    Potassium 99 MG TABS Take 2 tablets by mouth daily.   predniSONE (DELTASONE) 10 MG tablet Take 1 tablet by mouth every other day.   tirzepatide [facility]) 5 MG/0.5ML Pen Inject 5 mg into the skin once a week.   Turmeric 400 MG CAPS Take 1 capsule by mouth daily.   omeprazole (PRILOSEC) 40 MG capsule Take by mouth.   No facility-administered encounter medications on  file as of 10/25/2022.    Allergies (verified) Other and Fish allergy   History: Past Medical History:  Diagnosis Date   Arthritis    Asthma    managed by Meredeth Ide   Bronchitis, chronic obstructive, with exacerbation (HCC) 04/26/2014   COPD (chronic obstructive pulmonary disease) (HCC)    GERD (gastroesophageal reflux disease)    controlled only with nexium priro prevacid failutre   Heart murmur    Hypercholesterolemia    Hyperlipidemia with target LDL less than 100 05/28/2011    Hypertension    Impotence due to erectile dysfunction 04/26/2014   Left inguinal hernia 04/07/2016   Obstructive sleep apnea of adult 2012   on CPAP  tolerating ,  12 cm H20 , room air    Palpitations 11/27/2011   Tetralogy of Fallot    Thoracic aortic aneurysm [facility])    Treadmill stress test negative for angina pectoris 2005   Umbilical hernia without obstruction and without gangrene 03/10/2016   Past Surgical History:  Procedure Laterality Date   CARDIAC CATHETERIZATION     CATARACT EXTRACTION W/PHACO Right 11/16/2014   Procedure: CATARACT EXTRACTION PHACO AND INTRAOCULAR LENS PLACEMENT (IOC);  Surgeon: Sallee Lange, MD;  Location: [facility] ORS;  Service: Ophthalmology;  Laterality: Right;  ZOX#[id number] H US:01:03.9 AP%:24.3% CDE:30.12   CATARACT EXTRACTION W/PHACO Left 02/21/2017   Procedure: CATARACT EXTRACTION PHACO AND INTRAOCULAR LENS PLACEMENT (IOC) LEFT;  Surgeon: Lockie Mola, MD;  Location: [facility];  Service: Ophthalmology;  Laterality: Left;  requests later   COLONOSCOPY WITH PROPOFOL N/A 06/17/2019   Procedure: COLONOSCOPY WITH PROPOFOL;  Surgeon: Midge Minium, MD;  Location: [facility] END[facility];  Service: Endoscopy;  Laterality: N/A;   EYE SURGERY     INGUINAL HERNIA REPAIR Left 04/20/2016   Procedure: HERNIA REPAIR INGUINAL ADULT WITH MESH;  Surgeon: Ricarda Frame, MD;  Location: [facility] ORS;  Service: General;  Laterality: Left;   TETRALOGY OF FALLOT REPAIR  1974   [facility] [place]   TONSILLECTOMY     UMBILICAL HERNIA REPAIR N/A 04/20/2016   Procedure: HERNIA REPAIR UMBILICAL ADULT WITH MESH ;  Surgeon: Ricarda Frame, MD;  Location: [facility] ORS;  Service: General;  Laterality: N/A;   Family History  Problem Relation Age of Onset   Heart disease Father 32       CAD   Cancer Brother 8       liver CA mets to brain    Cancer Brother        colon Ca , stomach CA   Social History   Socioeconomic History   Marital status: Married    Spouse name: Not on file    Number of children: Not on file   Years of education: Not on file   Highest education level: Master's degree (e.g., MA, MS, MEng, MEd, MSW, MBA)  Occupational History   Occupation: retired Research scientist (medical)     Comment: travels  to [place] weekly   Tobacco Use   Smoking status: Never   Smokeless tobacco: Never  Vaping Use   Vaping status: Never Used  Substance and Sexual Activity   Alcohol use: Yes    Alcohol/week: 14.0 standard drinks of alcohol    Types: 5 Cans of beer, 4 Shots of liquor, 5 Glasses of wine per week    Comment:     Drug use: No   Sexual activity: Not Currently  Other Topics Concern   Not on file  Social History Narrative   Married   Social Determinants of Health  Financial Resource Strain: Low Risk  (10/21/2022)   Overall Financial Resource Strain (CARDIA)    Difficulty of Paying Living Expenses: Not hard at all  Food Insecurity: No Food Insecurity (10/21/2022)   Hunger Vital Sign    Worried About Running Out of Food in the Last Year: Never true    Ran Out of Food in the Last Year: Never true  Transportation Needs: No Transportation Needs (10/21/2022)   PRAPARE - Administrator, Civil Service (Medical): No    Lack of Transportation (Non-Medical): No  Physical Activity: Sufficiently Active (10/21/2022)   Exercise Vital Sign    Days of Exercise per Week: 3 days    Minutes of Exercise per Session: 120 min  Stress: No Stress Concern Present (10/21/2022)   [employer] of Occupational Health - Occupational Stress Questionnaire    Feeling of Stress : Not at all  Social Connections: Unknown (10/21/2022)   Social Connection and Isolation Panel [NHANES]    Frequency of Communication with Friends and Family: Once a week    Frequency of Social Gatherings with Friends and Family: More than three times a week    Attends Religious Services: Patient declined    Database administrator or Organizations: Yes    Attends Banker Meetings: Never    Marital  Status: Married    Tobacco Counseling Counseling given: Not Answered   Clinical Intake:  Pre-visit preparation completed: Yes  Pain : 0-10 Pain Score: 4  Pain Type: Chronic pain Pain Location: Knee Pain Orientation: Left Pain Descriptors / Indicators: Stabbing, Throbbing Pain Onset: More than a month ago Pain Frequency: Intermittent     BMI - recorded: 28.48 Nutritional Status: BMI 25 -29 Overweight Nutritional Risks: Non-healing wound Diabetes: No  How often do you need to have someone help you when you read instructions, pamphlets, or other written materials from your doctor or pharmacy?: 1 - Never  Interpreter Needed?: No  Information entered by :: R. Rachelann Enloe LPN   Activities of Daily Living    10/21/2022    9:45 AM  In your present state of health, do you have any difficulty performing the following activities:  Hearing? 0  Vision? 0  Difficulty concentrating or making decisions? 0  Walking or climbing stairs? 1  Dressing or bathing? 0  Doing errands, shopping? 0  Preparing Food and eating ? N  Using the Toilet? N  In the past six months, have you accidently leaked urine? N  Do you have problems with loss of bowel control? N  Managing your Medications? N  Managing your Finances? N  Housekeeping or managing your Housekeeping? N    Patient Care Team: Sherlene Shams, MD as PCP - General (Internal Medicine)  Indicate any recent Medical Services you may have received from other than [facility] providers in the past year (date may be approximate).     Assessment:   This is a routine wellness examination for Charles Mccann.  Hearing/Vision screen Hearing Screening - Comments:: No issues Vision Screening - Comments:: glasses   Goals Addressed             This Visit's Progress    Patient Stated       Wants to get knees fixed and lose weight       Depression Screen    10/25/2022    9:22 AM 06/02/2022   10:24 AM 12/01/2021   10:13 AM 10/19/2021   11:15 AM  09/28/2021    9:57 AM 06/01/2021  10:46 AM 12/01/2020   10:14 AM  PHQ 2/9 Scores  PHQ - 2 Score 0 0 0 0 0 0 0  PHQ- 9 Score 0          Fall Risk    10/21/2022    9:45 AM 06/02/2022   10:24 AM 12/01/2021   10:13 AM 10/19/2021   11:15 AM 09/28/2021    9:57 AM  Fall Risk   Falls in the past year? 0 0 0 0 0  Number falls in past yr: 0 0  0 0  Injury with Fall? 0 0  0 0  Risk for fall due to : No Fall Risks No Fall Risks No Fall Risks  No Fall Risks  Follow up Falls prevention discussed;Falls evaluation completed Falls evaluation completed Falls evaluation completed Falls evaluation completed Falls evaluation completed    MEDICARE RISK AT HOME: Medicare Risk at Home Any stairs in or around the home?: Yes If so, are there any without handrails?: No Home free of loose throw rugs in walkways, pet beds, electrical cords, etc?: Yes Adequate lighting in your home to reduce risk of falls?: Yes Life alert?: No Use of a cane, walker or w/c?: No Grab bars in the bathroom?: No Shower chair or bench in shower?: No Elevated toilet seat or a handicapped toilet?: No  Cognitive Function:        10/25/2022    9:27 AM 10/19/2021    1:12 PM 09/04/2018   11:23 AM  6CIT Screen  What Year? 0 points 0 points 0 points  What month? 0 points 0 points 0 points  What time? 0 points 0 points 0 points  Count back from 20 0 points  0 points  Months in reverse 0 points  0 points  Repeat phrase 2 points  0 points  Total Score 2 points  0 points    Immunizations Immunization History  Administered Date(s) Administered   Fluad Quad(high Dose 65+) 11/14/2018   Influenza Inj Mdck Quad Pf 11/17/2015   Influenza Split 11/27/2011   Influenza-Unspecified 11/25/2012, 11/23/2013, 12/26/2016, 11/20/2017, 11/17/2019, 11/01/2020, 11/14/2021   Moderna SARS-COV2 Booster Vaccination 04/01/2020, 10/29/2021   Moderna Sars-Covid-2 Vaccination 02/25/2019, 03/25/2019, 09/21/2019, 09/08/2020   Pneumococcal Conjugate-13  04/24/2014, 11/20/2017   Pneumococcal Polysaccharide-23 11/27/2011, 12/01/2019   Respiratory Syncytial Virus Vaccine,Recomb Aduvanted(Arexvy) 11/21/2021   Tdap 01/09/2013   Zoster Recombinant(Shingrix) 12/10/2019, 03/16/2020   Zoster, Live 11/25/2012    TDAP status: Due, Education has been provided regarding the importance of this vaccine. Advised may receive this vaccine at local pharmacy or Health Dept. Aware to provide a copy of the vaccination record if obtained from local pharmacy or Health Dept. Verbalized acceptance and understanding.  Flu Vaccine status: Due, Education has been provided regarding the importance of this vaccine. Advised may receive this vaccine at local pharmacy or Health Dept. Aware to provide a copy of the vaccination record if obtained from local pharmacy or Health Dept. Verbalized acceptance and understanding.  Pneumococcal vaccine status: Up to date  Covid-19 vaccine status: Information provided on how to obtain vaccines.   Qualifies for Shingles Vaccine? Yes   Zostavax completed Yes   Shingrix Completed?: Yes  Screening Tests Health Maintenance  Topic Date Due   FOOT EXAM  Never done   OPHTHALMOLOGY EXAM  Never done   INFLUENZA VACCINE  09/07/2022   COVID-19 Vaccine (5 - 2023-24 season) 10/08/2022   Medicare Annual Wellness (AWV)  10/20/2022   Diabetic kidney evaluation - Urine ACR  12/02/2022  HEMOGLOBIN A1C  12/02/2022   DTaP/Tdap/Td (2 - Td or Tdap) 01/10/2023   Diabetic kidney evaluation - eGFR measurement  06/02/2023   Colonoscopy  06/16/2024   Pneumonia Vaccine 63+ Years old  Completed   Hepatitis C Screening  Completed   Zoster Vaccines- Shingrix  Completed   HPV VACCINES  Aged Out    Health Maintenance  Health Maintenance Due  Topic Date Due   FOOT EXAM  Never done   OPHTHALMOLOGY EXAM  Never done   INFLUENZA VACCINE  09/07/2022   COVID-19 Vaccine (5 - 2023-24 season) 10/08/2022   Medicare Annual Wellness (AWV)  10/20/2022     Colorectal cancer screening: Type of screening: Colonoscopy. Completed 5/21. Repeat every 5 years  Lung Cancer Screening: (Low Dose CT Chest recommended if Age 6-80 years, 20 pack-year currently smoking OR have quit w/in 15years.) does not qualify.    Additional Screening:  Hepatitis C Screening: does qualify; Completed 2/18  Vision Screening: Recommended annual ophthalmology exams for early detection of glaucoma and other disorders of the eye. Is the patient up to date with their annual eye exam?  Yes  Who is the provider or what is the name of the office in which the patient attends annual eye exams? [facility] If pt is not established with a provider, would they like to be referred to a provider to establish care? No .   Dental Screening: Recommended annual dental exams for proper oral hygiene    Community Resource Referral / Chronic Care Management: CRR required this visit?  No   CCM required this visit?  No     Plan:     I have personally reviewed and noted the following in the patient's chart:   Medical and social history Use of alcohol, tobacco or illicit drugs  Current medications and supplements including opioid prescriptions. Patient is not currently taking opioid prescriptions. Functional ability and status Nutritional status Physical activity Advanced directives List of other physicians Hospitalizations, surgeries, and ER visits in previous 12 months Vitals Screenings to include cognitive, depression, and falls Referrals and appointments  In addition, I have reviewed and discussed with patient certain preventive protocols, quality metrics, and best practice recommendations. A written personalized care plan for preventive services as well as general preventive health recommendations were provided to patient.     Sydell Axon, LPN   10/21/7827   After Visit Summary: (MyChart) Due to this being a telephonic visit, the after visit summary with patients  personalized plan was offered to patient via MyChart   Nurse Notes: None Patient stated that he does not have diabetes.

## 2022-10-25 NOTE — Patient Instructions (Signed)
Charles Mccann , Thank you for taking time to come for your Medicare Wellness Visit. I appreciate your ongoing commitment to your health goals. Please review the following plan we discussed and let me know if I can assist you in the future.   Referrals/Orders/Follow-Ups/Clinician Recommendations: None  This is a list of the screening recommended for you and due dates:  Health Maintenance  Topic Date Due   Complete foot exam   Never done   Eye exam for diabetics  Never done   Flu Shot  09/07/2022   COVID-19 Vaccine (5 - 2023-24 season) 10/08/2022   Yearly kidney health urinalysis for diabetes  12/02/2022   Hemoglobin A1C  12/02/2022   DTaP/Tdap/Td vaccine (2 - Td or Tdap) 01/10/2023   Yearly kidney function blood test for diabetes  06/02/2023   Medicare Annual Wellness Visit  10/25/2023   Colon Cancer Screening  06/16/2024   Pneumonia Vaccine  Completed   Hepatitis C Screening  Completed   Zoster (Shingles) Vaccine  Completed   HPV Vaccine  Aged Out    Advanced directives: (Copy Requested) Please bring a copy of your health care power of attorney and living will to the office to be added to your chart at your convenience.  Next Medicare Annual Wellness Visit scheduled for next year: Yes 10/30/23 @ 10:15

## 2022-10-26 DIAGNOSIS — Z9889 Other specified postprocedural states: Secondary | ICD-10-CM | POA: Diagnosis not present

## 2022-10-26 DIAGNOSIS — S82032A Displaced transverse fracture of left patella, initial encounter for closed fracture: Secondary | ICD-10-CM | POA: Diagnosis not present

## 2022-11-03 DIAGNOSIS — R000 Tachycardia, unspecified: Secondary | ICD-10-CM | POA: Diagnosis not present

## 2022-11-07 DIAGNOSIS — G4733 Obstructive sleep apnea (adult) (pediatric): Secondary | ICD-10-CM | POA: Diagnosis not present

## 2022-11-10 ENCOUNTER — Other Ambulatory Visit: Payer: Self-pay | Admitting: Internal Medicine

## 2022-11-10 DIAGNOSIS — Z8774 Personal history of (corrected) congenital malformations of heart and circulatory system: Secondary | ICD-10-CM | POA: Diagnosis not present

## 2022-11-10 DIAGNOSIS — I7121 Aneurysm of the ascending aorta, without rupture: Secondary | ICD-10-CM | POA: Diagnosis not present

## 2022-11-10 DIAGNOSIS — I471 Supraventricular tachycardia, unspecified: Secondary | ICD-10-CM | POA: Diagnosis not present

## 2022-11-13 DIAGNOSIS — D492 Neoplasm of unspecified behavior of bone, soft tissue, and skin: Secondary | ICD-10-CM | POA: Diagnosis not present

## 2022-11-13 DIAGNOSIS — L98499 Non-pressure chronic ulcer of skin of other sites with unspecified severity: Secondary | ICD-10-CM | POA: Diagnosis not present

## 2022-11-13 DIAGNOSIS — L89229 Pressure ulcer of left hip, unspecified stage: Secondary | ICD-10-CM | POA: Diagnosis not present

## 2022-11-16 DIAGNOSIS — S82032A Displaced transverse fracture of left patella, initial encounter for closed fracture: Secondary | ICD-10-CM | POA: Diagnosis not present

## 2022-11-23 DIAGNOSIS — G4733 Obstructive sleep apnea (adult) (pediatric): Secondary | ICD-10-CM | POA: Diagnosis not present

## 2022-11-29 DIAGNOSIS — I7121 Aneurysm of the ascending aorta, without rupture: Secondary | ICD-10-CM | POA: Diagnosis not present

## 2022-11-29 DIAGNOSIS — Z8774 Personal history of (corrected) congenital malformations of heart and circulatory system: Secondary | ICD-10-CM | POA: Diagnosis not present

## 2022-11-29 DIAGNOSIS — R000 Tachycardia, unspecified: Secondary | ICD-10-CM | POA: Diagnosis not present

## 2022-11-29 DIAGNOSIS — I350 Nonrheumatic aortic (valve) stenosis: Secondary | ICD-10-CM | POA: Diagnosis not present

## 2022-12-07 DIAGNOSIS — Z96651 Presence of right artificial knee joint: Secondary | ICD-10-CM | POA: Diagnosis not present

## 2022-12-07 DIAGNOSIS — S82032A Displaced transverse fracture of left patella, initial encounter for closed fracture: Secondary | ICD-10-CM | POA: Diagnosis not present

## 2022-12-08 DIAGNOSIS — G4733 Obstructive sleep apnea (adult) (pediatric): Secondary | ICD-10-CM | POA: Diagnosis not present

## 2022-12-13 DIAGNOSIS — R69 Illness, unspecified: Secondary | ICD-10-CM | POA: Diagnosis not present

## 2022-12-20 DIAGNOSIS — B029 Zoster without complications: Secondary | ICD-10-CM | POA: Diagnosis not present

## 2022-12-21 DIAGNOSIS — S82032A Displaced transverse fracture of left patella, initial encounter for closed fracture: Secondary | ICD-10-CM | POA: Diagnosis not present

## 2022-12-24 DIAGNOSIS — G4733 Obstructive sleep apnea (adult) (pediatric): Secondary | ICD-10-CM | POA: Diagnosis not present

## 2023-01-06 DIAGNOSIS — R69 Illness, unspecified: Secondary | ICD-10-CM | POA: Diagnosis not present

## 2023-01-07 DIAGNOSIS — G4733 Obstructive sleep apnea (adult) (pediatric): Secondary | ICD-10-CM | POA: Diagnosis not present

## 2023-01-15 DIAGNOSIS — L821 Other seborrheic keratosis: Secondary | ICD-10-CM | POA: Diagnosis not present

## 2023-01-15 DIAGNOSIS — L570 Actinic keratosis: Secondary | ICD-10-CM | POA: Diagnosis not present

## 2023-01-15 DIAGNOSIS — L2489 Irritant contact dermatitis due to other agents: Secondary | ICD-10-CM | POA: Diagnosis not present

## 2023-01-24 ENCOUNTER — Encounter: Payer: Self-pay | Admitting: Internal Medicine

## 2023-01-24 ENCOUNTER — Ambulatory Visit: Payer: Medicare HMO | Admitting: Internal Medicine

## 2023-01-24 DIAGNOSIS — R69 Illness, unspecified: Secondary | ICD-10-CM | POA: Diagnosis not present

## 2023-01-24 NOTE — Telephone Encounter (Signed)
 Care team updated and letter sent for eye exam notes.

## 2023-01-30 ENCOUNTER — Other Ambulatory Visit: Payer: Self-pay | Admitting: Internal Medicine

## 2023-02-06 ENCOUNTER — Other Ambulatory Visit: Payer: Self-pay | Admitting: Internal Medicine

## 2023-02-08 DIAGNOSIS — S82032A Displaced transverse fracture of left patella, initial encounter for closed fracture: Secondary | ICD-10-CM | POA: Diagnosis not present

## 2023-02-12 DIAGNOSIS — H26491 Other secondary cataract, right eye: Secondary | ICD-10-CM | POA: Diagnosis not present

## 2023-02-12 DIAGNOSIS — Z961 Presence of intraocular lens: Secondary | ICD-10-CM | POA: Diagnosis not present

## 2023-02-12 LAB — HM DIABETES EYE EXAM

## 2023-02-13 DIAGNOSIS — J4530 Mild persistent asthma, uncomplicated: Secondary | ICD-10-CM | POA: Diagnosis not present

## 2023-02-13 DIAGNOSIS — G4733 Obstructive sleep apnea (adult) (pediatric): Secondary | ICD-10-CM | POA: Diagnosis not present

## 2023-02-14 ENCOUNTER — Encounter: Payer: Self-pay | Admitting: Internal Medicine

## 2023-02-14 ENCOUNTER — Ambulatory Visit (INDEPENDENT_AMBULATORY_CARE_PROVIDER_SITE_OTHER): Payer: Medicare Other | Admitting: Internal Medicine

## 2023-02-14 VITALS — BP 130/70 | HR 65 | Ht 72.0 in | Wt 210.6 lb

## 2023-02-14 DIAGNOSIS — E785 Hyperlipidemia, unspecified: Secondary | ICD-10-CM | POA: Diagnosis not present

## 2023-02-14 DIAGNOSIS — L509 Urticaria, unspecified: Secondary | ICD-10-CM

## 2023-02-14 DIAGNOSIS — Z125 Encounter for screening for malignant neoplasm of prostate: Secondary | ICD-10-CM | POA: Diagnosis not present

## 2023-02-14 DIAGNOSIS — I10 Essential (primary) hypertension: Secondary | ICD-10-CM

## 2023-02-14 DIAGNOSIS — E1169 Type 2 diabetes mellitus with other specified complication: Secondary | ICD-10-CM | POA: Diagnosis not present

## 2023-02-14 DIAGNOSIS — Z0001 Encounter for general adult medical examination with abnormal findings: Secondary | ICD-10-CM | POA: Diagnosis not present

## 2023-02-14 DIAGNOSIS — Z7985 Long-term (current) use of injectable non-insulin antidiabetic drugs: Secondary | ICD-10-CM

## 2023-02-14 DIAGNOSIS — R350 Frequency of micturition: Secondary | ICD-10-CM

## 2023-02-14 DIAGNOSIS — Z860100 Personal history of colon polyps, unspecified: Secondary | ICD-10-CM

## 2023-02-14 DIAGNOSIS — E1159 Type 2 diabetes mellitus with other circulatory complications: Secondary | ICD-10-CM | POA: Diagnosis not present

## 2023-02-14 DIAGNOSIS — Z6828 Body mass index (BMI) 28.0-28.9, adult: Secondary | ICD-10-CM

## 2023-02-14 DIAGNOSIS — E119 Type 2 diabetes mellitus without complications: Secondary | ICD-10-CM

## 2023-02-14 DIAGNOSIS — E669 Obesity, unspecified: Secondary | ICD-10-CM | POA: Diagnosis not present

## 2023-02-14 DIAGNOSIS — Z1211 Encounter for screening for malignant neoplasm of colon: Secondary | ICD-10-CM

## 2023-02-14 DIAGNOSIS — N401 Enlarged prostate with lower urinary tract symptoms: Secondary | ICD-10-CM | POA: Diagnosis not present

## 2023-02-14 DIAGNOSIS — H532 Diplopia: Secondary | ICD-10-CM | POA: Diagnosis not present

## 2023-02-14 DIAGNOSIS — Z79899 Other long term (current) drug therapy: Secondary | ICD-10-CM | POA: Diagnosis not present

## 2023-02-14 DIAGNOSIS — I152 Hypertension secondary to endocrine disorders: Secondary | ICD-10-CM | POA: Diagnosis not present

## 2023-02-14 LAB — CBC WITH DIFFERENTIAL/PLATELET
Basophils Absolute: 0.1 10*3/uL (ref 0.0–0.1)
Basophils Relative: 1.4 % (ref 0.0–3.0)
Eosinophils Absolute: 0.8 10*3/uL — ABNORMAL HIGH (ref 0.0–0.7)
Eosinophils Relative: 8.9 % — ABNORMAL HIGH (ref 0.0–5.0)
HCT: 43.4 % (ref 39.0–52.0)
Hemoglobin: 14.6 g/dL (ref 13.0–17.0)
Lymphocytes Relative: 24.6 % (ref 12.0–46.0)
Lymphs Abs: 2.1 10*3/uL (ref 0.7–4.0)
MCHC: 33.6 g/dL (ref 30.0–36.0)
MCV: 93 fL (ref 78.0–100.0)
Monocytes Absolute: 0.9 10*3/uL (ref 0.1–1.0)
Monocytes Relative: 9.9 % (ref 3.0–12.0)
Neutro Abs: 4.8 10*3/uL (ref 1.4–7.7)
Neutrophils Relative %: 55.2 % (ref 43.0–77.0)
Platelets: 279 10*3/uL (ref 150.0–400.0)
RBC: 4.67 Mil/uL (ref 4.22–5.81)
RDW: 13.7 % (ref 11.5–15.5)
WBC: 8.7 10*3/uL (ref 4.0–10.5)

## 2023-02-14 LAB — COMPREHENSIVE METABOLIC PANEL
ALT: 12 U/L (ref 0–53)
AST: 15 U/L (ref 0–37)
Albumin: 4.1 g/dL (ref 3.5–5.2)
Alkaline Phosphatase: 102 U/L (ref 39–117)
BUN: 16 mg/dL (ref 6–23)
CO2: 34 meq/L — ABNORMAL HIGH (ref 19–32)
Calcium: 9.7 mg/dL (ref 8.4–10.5)
Chloride: 99 meq/L (ref 96–112)
Creatinine, Ser: 1.1 mg/dL (ref 0.40–1.50)
GFR: 67.5 mL/min (ref >60.00)
Glucose, Bld: 94 mg/dL (ref 70–99)
Potassium: 3.7 meq/L (ref 3.5–5.1)
Sodium: 140 meq/L (ref 135–145)
Total Bilirubin: 0.8 mg/dL (ref 0.2–1.2)
Total Protein: 6.8 g/dL (ref 6.0–8.3)

## 2023-02-14 LAB — URINALYSIS, ROUTINE W REFLEX MICROSCOPIC
Bilirubin Urine: NEGATIVE
Hgb urine dipstick: NEGATIVE
Ketones, ur: NEGATIVE
Leukocytes,Ua: NEGATIVE
Nitrite: NEGATIVE
RBC / HPF: NONE SEEN (ref 0–?)
Specific Gravity, Urine: 1.01 (ref 1.000–1.030)
Total Protein, Urine: NEGATIVE
Urine Glucose: NEGATIVE
Urobilinogen, UA: 0.2 (ref 0.0–1.0)
WBC, UA: NONE SEEN (ref 0–?)
pH: 7 (ref 5.0–8.0)

## 2023-02-14 LAB — MICROALBUMIN / CREATININE URINE RATIO
Creatinine,U: 77.8 mg/dL
Microalb Creat Ratio: 0.9 mg/g (ref 0.0–30.0)
Microalb, Ur: <0.7 mg/dL (ref 0.0–1.9)

## 2023-02-14 LAB — TSH: TSH: 3.11 u[IU]/mL (ref 0.35–5.50)

## 2023-02-14 LAB — LDL CHOLESTEROL, DIRECT: Direct LDL: 75 mg/dL

## 2023-02-14 LAB — PSA, MEDICARE: PSA: 1.19 ng/mL (ref 0.10–4.00)

## 2023-02-14 LAB — LIPID PANEL
Cholesterol: 127 mg/dL (ref 0–200)
HDL: 35.9 mg/dL — ABNORMAL LOW (ref >39.00)
LDL Cholesterol: 74 mg/dL (ref 0–99)
NonHDL: 91.29
Total CHOL/HDL Ratio: 4
Triglycerides: 86 mg/dL (ref 0.0–149.0)
VLDL: 17.2 mg/dL (ref 0.0–40.0)

## 2023-02-14 LAB — HEMOGLOBIN A1C: Hgb A1c MFr Bld: 6.2 % (ref 4.6–6.5)

## 2023-02-14 MED ORDER — TIRZEPATIDE 7.5 MG/0.5ML ~~LOC~~ SOAJ: 7.5000 mg | SUBCUTANEOUS | 2 refills | Status: DC

## 2023-02-14 MED ORDER — TIRZEPATIDE 10 MG/0.5ML ~~LOC~~ SOAJ: 10.0000 mg | SUBCUTANEOUS | 2 refills | Status: DC

## 2023-02-14 NOTE — Progress Notes (Signed)
 Patient ID: Charles Mccann, male    DOB: [birth year]  Age: 73 y.o. MRN: [id number]  The patient is here for annual preventive examination and management of other chronic and acute problems.   The risk factors are reflected in the social history.   The roster of all physicians providing medical care to patient - is listed in the Snapshot section of the chart.   Activities of daily living:  The patient is 100% independent in all ADLs: dressing, toileting, feeding as well as independent mobility   Home safety : The patient has smoke detectors in the home. They wear seatbelts.  There are no unsecured firearms at home. There is no violence in the home.    There is no risks for hepatitis, STDs or HIV. There is no   history of blood transfusion. They have no travel history to infectious disease endemic areas of the world.   The patient has seen their dentist in the last six month. They have seen their eye doctor in the last year. The patinet  denies slight hearing difficulty with regard to whispered voices and some television programs.  They have deferred audiologic testing in the last year.  They do not  have excessive sun exposure. Discussed the need for sun protection: hats, long sleeves and use of sunscreen if there is significant sun exposure.    Diet: the importance of a healthy diet is discussed. They do have a healthy diet.   The benefits of regular aerobic exercise were discussed. The patient  exercises  3 to 5 days per week  for  60 minutes.    Depression screen: there are no signs or vegative symptoms of depression- irritability, change in appetite, anhedonia, sadness/tearfullness.   The following portions of the patient's history were reviewed and updated as appropriate: allergies, current medications, past family history, past medical history,  past surgical history, past social history  and problem list.   Visual acuity was not assessed per patient preference since the patient has  regular follow up with an  ophthalmologist. Hearing and body mass index were assessed and reviewed.    During the course of the visit the patient was educated and counseled about appropriate screening and preventive services including : fall prevention , diabetes screening, nutrition counseling, colorectal cancer screening, and recommended immunizations.    Chief Complaint:   1) type 2 DM diagnosed in oct 2023 with ac 6.5 taking mounjaro  5 mg   2) infrequent brief episodes of vertigo lasting  several minutes  and will  occur at rest.  No presyncope or palpitations  once a week   accompanied by double vision ,  for the past several month s   Review of Symptoms  Patient denies headache, fevers, malaise, unintentional weight loss, skin rash, eye pain, sinus congestion and sinus pain, sore throat, dysphagia,  hemoptysis , cough, dyspnea, wheezing, chest pain, palpitations, orthopnea, edema, abdominal pain, nausea, melena, diarrhea, constipation, flank pain, dysuria, hematuria, urinary  Frequency, nocturia, numbness, tingling, seizures,  Focal weakness, Loss of consciousness,  Tremor, insomnia, depression, anxiety, and suicidal ideation.    Physical Exam:  BP 130/70   Pulse 65   Ht 6' (1.829 m)   Wt 210 lb 9.6 oz (95.5 kg)   SpO2 98%   BMI 28.56 kg/m    Physical Exam Vitals reviewed.  Constitutional:      General: He is not in acute distress.    Appearance: Normal appearance. He is normal weight. He is  not ill-appearing, toxic-appearing or diaphoretic.  HENT:     Head: Normocephalic and atraumatic.     Right Ear: Tympanic membrane, ear canal and external ear normal. There is no impacted cerumen.     Left Ear: Tympanic membrane, ear canal and external ear normal. There is no impacted cerumen.     Nose: Nose normal.     Mouth/Throat:     Mouth: Mucous membranes are moist.     Pharynx: Oropharynx is clear.  Eyes:     General: No scleral icterus.       Right eye: No discharge.         Left eye: No discharge.     Conjunctiva/sclera: Conjunctivae normal.  Neck:     Thyroid : No thyromegaly.     Vascular: No carotid bruit or JVD.  Cardiovascular:     Rate and Rhythm: Normal rate and regular rhythm.     Heart sounds: Normal heart sounds.  Pulmonary:     Effort: Pulmonary effort is normal. No respiratory distress.     Breath sounds: Normal breath sounds.  Abdominal:     General: Bowel sounds are normal.     Palpations: Abdomen is soft. There is no mass.     Tenderness: There is no abdominal tenderness. There is no guarding or rebound.  Musculoskeletal:        General: Normal range of motion.     Cervical back: Normal range of motion and neck supple.  Lymphadenopathy:     Cervical: No cervical adenopathy.  Skin:    General: Skin is warm and dry.  Neurological:     General: No focal deficit present.     Mental Status: He is alert and oriented to person, place, and time. Mental status is at baseline.  Psychiatric:        Mood and Affect: Mood normal.        Behavior: Behavior normal.        Thought Content: Thought content normal.        Judgment: Judgment normal.    Assessment and Plan: Obesity, diabetes, and hypertension syndrome (HCC) Assessment & Plan: Diagnosed in October  2023 with A1c of 6.5  , with improved A1c with Mounjaro ,  but his weight  loss has plateuaed with  5 mg  dose.  He is paying $263/month foMounjaro .  Encouraged to accept the diagnosis and continue annual follow up with Dr Dingledein    Lab Results  Component Value Date   HGBA1C 5.9 06/02/2022     Orders: -     Comprehensive metabolic panel -     Hemoglobin A1c -     Microalbumin / creatinine urine ratio  Hyperlipidemia with target LDL less than 100 -     Lipid panel -     LDL cholesterol, direct  Prostate cancer screening -     PSA, Medicare  Long-term use of high-risk medication -     TSH -     CBC with Differential/Platelet  Benign prostatic hyperplasia with urinary  frequency -     Urinalysis, Routine w reflex microscopic -     Urine Culture  Colon cancer screening -     Ambulatory referral to Gastroenterology  Hives Assessment & Plan: Suggested by history of transient but recurrent pruritic nodules occurring  sporadically on palms,  arms and occipital scalp (under CPAP band)  for the past month.  Resolve with hydrocortisone cream.  He has multiple allergies by 2023 testing done by  pulmonology.  Already taking zyrtec,  bendaryl and singulair     Transient diplopia Assessment & Plan: Occurring for a few seconds during infrequent episodes of dizziness,  less than once a week.  No headaches,  tachycardia or presyncope.SABRA  Neurologic exam is normal.  He defers MRI    Encounter for routine adult medical exam with abnormal findings Assessment & Plan: age appropriate education and counseling updated, referrals for preventative services and immunizations addressed, dietary and smoking counseling addressed, most recent labs reviewed.  I have personally reviewed and have noted:   1) the patient's medical and social history 2) The pt's use of alcohol, tobacco, and illicit drugs 3) The patient's current medications and supplements 4) Functional ability including ADL's, fall risk, home safety risk, hearing and visual impairment 5) Diet and physical activities 6) Evidence for depression or mood disorder 7) The patient's height, weight, and BMI have been recorded in the chart   I have made referrals, and provided counseling and education based on review of the above    History of colonic polyps Assessment & Plan: TAS found on 2021 colonoscopy and there is a FH.  5 yr follow up planned.  Referra t Darren Wohl in progress    Long-term current use of injectable noninsulin antidiabetic medication Assessment & Plan: Tolerating Mounjaro .  Weight has plateaued and he has no side effects.  Refill at 7.5 mg weekly    Primary hypertension Assessment &  Plan: Well controlled on current regimen. Renal function stable, no changes today.  Lab Results  Component Value Date   MICROALBUR <0.7 02/14/2023   MICROALBUR <0.7 12/01/2021     Lab Results  Component Value Date   CREATININE 1.10 02/14/2023   Lab Results  Component Value Date   NA 140 02/14/2023   K 3.7 02/14/2023   CL 99 02/14/2023   CO2 34 (H) 02/14/2023      Other orders -     Tirzepatide ; Inject 7.5 mg into the skin once a week.  Dispense: 6 mL; Refill: 2    No follow-ups on file.  Verneita LITTIE Kettering, MD

## 2023-02-14 NOTE — Assessment & Plan Note (Signed)
 TAS found on 2021 colonoscopy and there is a FH.  5 yr follow up planned.  Referra t Doctor, hospital in progress

## 2023-02-14 NOTE — Assessment & Plan Note (Signed)

## 2023-02-14 NOTE — Assessment & Plan Note (Signed)
 Tolerating Mounjaro.  Weight has plateaued and he has no side effects.  Refill at 7.5 mg weekly

## 2023-02-14 NOTE — Patient Instructions (Addendum)
 Referral to Rogelia Copping in process for your colonscopy  You HAVE well controlled type 2 Diabetes.  This means a diabetic eye exam is needed annually to look for signs of retinopathy   I have increased Mounjaro  dose to 7.5 mg weekly with next refill   Send me a picture of the next outbreak of itchy bumps  If the episodes of double vision and dizziness become more frequent,  notify me (MRI brain will be ordered) .  Check BS the nexgt time one occurs    You are overdue  due for your tetanus-diptheria-pertussis vaccine   (TDaP)   Medicare requires that you  get it at your pharmacy  .

## 2023-02-14 NOTE — Assessment & Plan Note (Addendum)
 Diagnosed in October  2023 with A1c of 6.5  , with improved A1c with Mounjaro ,  but his weight  loss has plateuaed with  5 mg  dose.  He is paying $263/month foMounjaro .  Encouraged to accept the diagnosis and continue annual follow up with Dr Dingledein    Lab Results  Component Value Date   HGBA1C 5.9 06/02/2022

## 2023-02-14 NOTE — Assessment & Plan Note (Signed)
 Well controlled on current regimen. Renal function stable, no changes today.  Lab Results  Component Value Date   MICROALBUR <0.7 02/14/2023   MICROALBUR <0.7 12/01/2021     Lab Results  Component Value Date   CREATININE 1.10 02/14/2023   Lab Results  Component Value Date   NA 140 02/14/2023   K 3.7 02/14/2023   CL 99 02/14/2023   CO2 34 (H) 02/14/2023

## 2023-02-14 NOTE — Assessment & Plan Note (Addendum)
 Suggested by history of transient but recurrent pruritic nodules occurring  sporadically on palms,  arms and occipital scalp (under CPAP band)  for the past month.  Resolve with hydrocortisone cream.  He has multiple allergies by 2023 testing done by pulmonology.  Already taking zyrtec,  bendaryl and singulair 

## 2023-02-14 NOTE — Assessment & Plan Note (Addendum)
 Occurring for a few seconds during infrequent episodes of dizziness,  less than once a week.  No headaches,  tachycardia or presyncope.Marland Kitchen  Neurologic exam is normal.  He defers MRI

## 2023-02-15 ENCOUNTER — Encounter: Payer: Self-pay | Admitting: Internal Medicine

## 2023-02-15 LAB — URINE CULTURE
MICRO NUMBER:: 15932973
Result:: NO GROWTH
SPECIMEN QUALITY:: ADEQUATE

## 2023-02-27 ENCOUNTER — Other Ambulatory Visit (HOSPITAL_COMMUNITY): Payer: Self-pay

## 2023-02-27 ENCOUNTER — Telehealth: Payer: Self-pay

## 2023-02-27 ENCOUNTER — Encounter: Payer: Self-pay | Admitting: Internal Medicine

## 2023-02-27 NOTE — Telephone Encounter (Signed)
Pa needed for [facility] 7.5mg 

## 2023-02-27 NOTE — Telephone Encounter (Signed)
Copied from CRM [id number]. Topic: General - Other >> Feb 27, 2023  3:50 PM Irine Seal wrote: Reason for CRM: Ortencia Kick with [facility] calling to confirm the prior authorization for pt's tirzepatide [facility]) 7.5 MG/0.5ML Pen, byron stated he woud be faxing the documents over today

## 2023-02-27 NOTE — Telephone Encounter (Signed)
Pharmacy Patient Advocate Encounter   Received notification from Patient Advice Request messages that prior authorization for Mounjaro 7.5MG /0.5ML auto-injectors is required/requested.   Insurance verification completed.   The patient is insured through [employer]  .   Per test claim: PA required; PA submitted to above mentioned insurance via CoverMyMeds Key/confirmation #/EOC [id number] Status is pending

## 2023-02-28 ENCOUNTER — Other Ambulatory Visit (HOSPITAL_COMMUNITY): Payer: Self-pay

## 2023-02-28 ENCOUNTER — Other Ambulatory Visit: Payer: Self-pay

## 2023-02-28 DIAGNOSIS — D102 Benign neoplasm of floor of mouth: Secondary | ICD-10-CM | POA: Diagnosis not present

## 2023-02-28 NOTE — Telephone Encounter (Signed)
Noted. Awaiting the paperwork

## 2023-02-28 NOTE — Telephone Encounter (Signed)
Pharmacy Patient Advocate Encounter  Received notification from [facility]  that Prior Authorization for [facility] 7.5MG /0.5ML auto-injectors  has been APPROVED from 02/27/23 to 02/27/24. Unable to obtain price due to refill too soon rejection, last fill date 02/27/23 next available fill date02/11/25   PA #/Case ID/Reference #: [id number]

## 2023-03-06 NOTE — Telephone Encounter (Signed)
See other telephone encounter. Charles Mccann was approved

## 2023-03-12 ENCOUNTER — Other Ambulatory Visit (HOSPITAL_COMMUNITY): Payer: Self-pay

## 2023-03-22 DIAGNOSIS — S82032A Displaced transverse fracture of left patella, initial encounter for closed fracture: Secondary | ICD-10-CM | POA: Diagnosis not present

## 2023-03-23 ENCOUNTER — Other Ambulatory Visit: Payer: Self-pay | Admitting: Internal Medicine

## 2023-04-07 DIAGNOSIS — G4733 Obstructive sleep apnea (adult) (pediatric): Secondary | ICD-10-CM | POA: Diagnosis not present

## 2023-04-23 DIAGNOSIS — I350 Nonrheumatic aortic (valve) stenosis: Secondary | ICD-10-CM | POA: Diagnosis not present

## 2023-04-23 DIAGNOSIS — Z8774 Personal history of (corrected) congenital malformations of heart and circulatory system: Secondary | ICD-10-CM | POA: Diagnosis not present

## 2023-04-23 DIAGNOSIS — I7121 Aneurysm of the ascending aorta, without rupture: Secondary | ICD-10-CM | POA: Diagnosis not present

## 2023-04-24 DIAGNOSIS — G4733 Obstructive sleep apnea (adult) (pediatric): Secondary | ICD-10-CM | POA: Diagnosis not present

## 2023-05-17 DIAGNOSIS — Z8774 Personal history of (corrected) congenital malformations of heart and circulatory system: Secondary | ICD-10-CM | POA: Diagnosis not present

## 2023-05-17 DIAGNOSIS — I358 Other nonrheumatic aortic valve disorders: Secondary | ICD-10-CM | POA: Diagnosis not present

## 2023-05-17 DIAGNOSIS — I7121 Aneurysm of the ascending aorta, without rupture: Secondary | ICD-10-CM | POA: Diagnosis not present

## 2023-05-17 DIAGNOSIS — I350 Nonrheumatic aortic (valve) stenosis: Secondary | ICD-10-CM | POA: Diagnosis not present

## 2023-05-21 DIAGNOSIS — Z8774 Personal history of (corrected) congenital malformations of heart and circulatory system: Secondary | ICD-10-CM | POA: Diagnosis not present

## 2023-05-21 DIAGNOSIS — I7121 Aneurysm of the ascending aorta, without rupture: Secondary | ICD-10-CM | POA: Diagnosis not present

## 2023-05-21 DIAGNOSIS — I350 Nonrheumatic aortic (valve) stenosis: Secondary | ICD-10-CM | POA: Diagnosis not present

## 2023-05-30 ENCOUNTER — Telehealth: Admitting: Internal Medicine

## 2023-06-04 ENCOUNTER — Encounter: Payer: Self-pay | Admitting: Internal Medicine

## 2023-06-04 ENCOUNTER — Telehealth (INDEPENDENT_AMBULATORY_CARE_PROVIDER_SITE_OTHER): Admitting: Internal Medicine

## 2023-06-04 VITALS — Ht 72.0 in | Wt 210.6 lb

## 2023-06-04 DIAGNOSIS — L509 Urticaria, unspecified: Secondary | ICD-10-CM | POA: Diagnosis not present

## 2023-06-04 MED ORDER — FAMOTIDINE 20 MG PO TABS: 20.0000 mg | ORAL_TABLET | Freq: Two times a day (BID) | ORAL | 2 refills | Status: DC

## 2023-06-04 NOTE — Patient Instructions (Signed)
 Increase use of zyrtec to twice daily.  2nd dose around 6 pm.  Add famotidine 20 mg twice daily (generic for pepcid,  [facility])  to complete the histamine blockade  Continue singulair and benadryl  If no improvement in 4-5 days,  I will prescribe a prednisone  taper

## 2023-06-04 NOTE — Progress Notes (Unsigned)
 Virtual Visit via Caregility   Note   This format is felt to be most appropriate for this patient at this time.  All issues noted in this document were discussed and addressed.  No physical exam was performed (except for noted visual exam findings with Video Visits).   I connected withNAME@ on 06/04/23 at  5:00 PM EDT by a video enabled telemedicine application or telephone and verified that I am speaking with the correct person using two identifiers. Location patient: home Location provider: work or home office Persons participating in the virtual visit: patient, provider  I discussed the limitations, risks, security and privacy concerns of performing an evaluation and management service by telephone and the availability of in person appointments. I also discussed with the patient that there may be a patient responsible charge related to this service. The patient expressed understanding and agreed to proceed.  Reason for visit: nocturnal hives   HPI:  73 yr old male with history of asthma/COPD overlap syndrome,  atopy with multiple food allergies since childhood managed with allergy desensitization injections for years,  OSA managed with CPAP,   presents with persistent rash that occurs each night by 9 pm and resolves by morning.  Moderately pruritic, limited to scalp and neck bilaterally,  first discussed in January,  was  occurring in on palms , arms and occipital scalp at that time.  Taking 10 mg cetirizine in the am and 25 mg diphenhydramine at bedtime, singulair once daily.     ROS: See pertinent positives and negatives per HPI.  Past Medical History:  Diagnosis Date   Arthritis    Asthma    managed by Jamal Mays   Bronchitis, chronic obstructive, with exacerbation (HCC) 04/26/2014   COPD (chronic obstructive pulmonary disease) (HCC)    GERD (gastroesophageal reflux disease)    controlled only with nexium priro prevacid failutre   Heart murmur    Hypercholesterolemia     Hyperlipidemia with target LDL less than 100 05/28/2011   Hypertension    Impotence due to erectile dysfunction 04/26/2014   Left inguinal hernia 04/07/2016   Obstructive sleep apnea of adult 2012   on CPAP  tolerating ,  12 cm H20 , room air    Palpitations 11/27/2011   Tetralogy of Fallot    Thoracic aortic aneurysm [facility])    Treadmill stress test negative for angina pectoris 2005   Umbilical hernia without obstruction and without gangrene 03/10/2016    Past Surgical History:  Procedure Laterality Date   CARDIAC CATHETERIZATION     CATARACT EXTRACTION W/PHACO Right 11/16/2014   Procedure: CATARACT EXTRACTION PHACO AND INTRAOCULAR LENS PLACEMENT (IOC);  Surgeon: Steven Dingeldein, MD;  Location: [facility] ORS;  Service: Ophthalmology;  Laterality: Right;  WUJ#[id number] H US :01:03.9 AP%:24.3% CDE:30.12   CATARACT EXTRACTION W/PHACO Left 02/21/2017   Procedure: CATARACT EXTRACTION PHACO AND INTRAOCULAR LENS PLACEMENT (IOC) LEFT;  Surgeon: Annell Kidney, MD;  Location: [facility];  Service: Ophthalmology;  Laterality: Left;  requests later   COLONOSCOPY WITH PROPOFOL  N/A 06/17/2019   Procedure: COLONOSCOPY WITH PROPOFOL ;  Surgeon: Marnee Sink, MD;  Location: [facility];  Service: Endoscopy;  Laterality: N/A;   EYE SURGERY     INGUINAL HERNIA REPAIR Left 04/20/2016   Procedure: HERNIA REPAIR INGUINAL ADULT WITH MESH;  Surgeon: Gwyndolyn Lerner, MD;  Location: [facility] OR[facility];  Service: General;  Laterality: Left;   TETRALOGY OF FALLOT REPAIR  1974   [facility] [place]   TONSILLECTOMY     UMBILICAL HERNIA REPAIR N/A 04/20/2016  Procedure: HERNIA REPAIR UMBILICAL ADULT WITH MESH ;  Surgeon: Gwyndolyn Lerner, MD;  Location: [facility];  Service: General;  Laterality: N/A;    Family History  Problem Relation Age of Onset   Heart disease Father 55       CAD   Cancer Brother 13       liver CA mets to brain    Cancer Brother        colon Ca , stomach CA    SOCIAL HX: ***   Current  Outpatient Medications:    albuterol  (PROVENTIL  HFA;VENTOLIN  HFA) 108 (90 BASE) MCG/ACT inhaler, Inhale 2 puffs into the lungs every 6 (six) hours as needed., Disp: , Rfl:    atorvastatin  (LIPITOR) 20 MG tablet, TAKE 1 TABLET BY MOUTH DAILY., Disp: 90 tablet, Rfl: 3   azelastine (ASTELIN) 0.1 % nasal spray, Place into the nose., Disp: , Rfl:    budesonide-formoterol (SYMBICORT) 160-4.5 MCG/ACT inhaler, Inhale 2 puffs into the lungs 2 (two) times daily., Disp: , Rfl:    celecoxib  (CELEBREX ) 200 MG capsule, TAKE 1 CAPSULE BY MOUTH TWICE DAILY AS NEEDED, Disp: 180 capsule, Rfl: 0   Cholecalciferol (VITAMIN D ) 2000 units CAPS, Take 2,000 Units by mouth every morning. , Disp: , Rfl:    fluticasone (FLONASE) 50 MCG/ACT nasal spray, Place 2 sprays into both nostrils at bedtime. , Disp: , Rfl:    hydrochlorothiazide  (HYDRODIURIL ) 25 MG tablet, TAKE ONE TABLET BY MOUTH EVERY DAY, Disp: 90 tablet, Rfl: 3   hydrocortisone 2.5 % ointment, Apply topically., Disp: , Rfl:    metoprolol succinate (TOPROL-XL) 25 MG 24 hr tablet, Take 25 mg by mouth daily., Disp: , Rfl:    montelukast (SINGULAIR) 10 MG tablet, Take 10 mg by mouth at bedtime. , Disp: , Rfl:    omeprazole (PRILOSEC) 40 MG capsule, Take by mouth., Disp: , Rfl:    Potassium 99 MG TABS, Take 2 tablets by mouth daily., Disp: , Rfl:    predniSONE  (DELTASONE ) 5 MG tablet, Take 5 mg by mouth daily with breakfast., Disp: , Rfl:    tirzepatide  (MOUNJARO ) 7.5 MG/0.5ML Pen, Inject 7.5 mg into the skin once a week., Disp: 6 mL, Rfl: 2  EXAM:  VITALS per patient if applicable:  GENERAL: alert, oriented, appears well and in no acute distress  HEENT: atraumatic, conjunttiva clear, no obvious abnormalities on inspection of external nose and ears  NECK: normal movements of the head and neck  LUNGS: on inspection no signs of respiratory distress, breathing rate appears normal, no obvious gross SOB, gasping or wheezing  CV: no obvious cyanosis  MS: moves  all visible extremities without noticeable abnormality  PSYCH/NEURO: pleasant and cooperative, no obvious depression or anxiety, speech and thought processing grossly intact  ASSESSMENT AND PLAN: There are no diagnoses linked to this encounter.    I discussed the assessment and treatment plan with the patient. The patient was provided an opportunity to ask questions and all were answered. The patient agreed with the plan and demonstrated an understanding of the instructions.   The patient was advised to call back or seek an in-person evaluation if the symptoms worsen or if the condition fails to improve as anticipated.   I spent 30 minutes dedicated to the care of this patient on the date of this encounter to include pre-visit review of his medical history,  Face-to-face time with the patient , and post visit ordering of testing and therapeutics.    Thersia Flax, MD

## 2023-06-05 NOTE — Assessment & Plan Note (Signed)
 Symptoms have been occurring nocturnally  for the past 4 months and limited to scalp and neck  .  hydrocortisone cream.  He has multiple allergies by 2023 testing done by pulmonology.  Already taking zyrtec,  bendaryl and singulair .  Advised to increase zyrtec to twice daily and add famotidine twice daily as well.  Follow up with Jinger Mount his dermatologist in one month

## 2023-06-06 DIAGNOSIS — D102 Benign neoplasm of floor of mouth: Secondary | ICD-10-CM | POA: Diagnosis not present

## 2023-06-06 DIAGNOSIS — R42 Dizziness and giddiness: Secondary | ICD-10-CM | POA: Diagnosis not present

## 2023-06-11 ENCOUNTER — Encounter: Payer: Self-pay | Admitting: Internal Medicine

## 2023-06-11 DIAGNOSIS — L509 Urticaria, unspecified: Secondary | ICD-10-CM

## 2023-06-11 DIAGNOSIS — R42 Dizziness and giddiness: Secondary | ICD-10-CM | POA: Diagnosis not present

## 2023-06-12 MED ORDER — PREDNISONE 10 MG PO TABS
ORAL_TABLET | ORAL | 0 refills | Status: DC
Start: 2023-06-12 — End: 2023-08-21

## 2023-06-24 DIAGNOSIS — G4733 Obstructive sleep apnea (adult) (pediatric): Secondary | ICD-10-CM | POA: Diagnosis not present

## 2023-07-16 DIAGNOSIS — L570 Actinic keratosis: Secondary | ICD-10-CM | POA: Diagnosis not present

## 2023-07-16 DIAGNOSIS — L814 Other melanin hyperpigmentation: Secondary | ICD-10-CM | POA: Diagnosis not present

## 2023-07-16 DIAGNOSIS — L905 Scar conditions and fibrosis of skin: Secondary | ICD-10-CM | POA: Diagnosis not present

## 2023-07-16 DIAGNOSIS — L821 Other seborrheic keratosis: Secondary | ICD-10-CM | POA: Diagnosis not present

## 2023-07-16 DIAGNOSIS — I872 Venous insufficiency (chronic) (peripheral): Secondary | ICD-10-CM | POA: Diagnosis not present

## 2023-07-18 DIAGNOSIS — J4489 Other specified chronic obstructive pulmonary disease: Secondary | ICD-10-CM | POA: Diagnosis not present

## 2023-07-18 DIAGNOSIS — G4733 Obstructive sleep apnea (adult) (pediatric): Secondary | ICD-10-CM | POA: Diagnosis not present

## 2023-07-18 DIAGNOSIS — J301 Allergic rhinitis due to pollen: Secondary | ICD-10-CM | POA: Diagnosis not present

## 2023-07-23 DIAGNOSIS — G4733 Obstructive sleep apnea (adult) (pediatric): Secondary | ICD-10-CM | POA: Diagnosis not present

## 2023-07-25 DIAGNOSIS — G4733 Obstructive sleep apnea (adult) (pediatric): Secondary | ICD-10-CM | POA: Diagnosis not present

## 2023-08-01 DIAGNOSIS — K080 Exfoliation of teeth due to systemic causes: Secondary | ICD-10-CM | POA: Diagnosis not present

## 2023-08-21 ENCOUNTER — Ambulatory Visit (INDEPENDENT_AMBULATORY_CARE_PROVIDER_SITE_OTHER): Payer: Medicare Other | Admitting: Internal Medicine

## 2023-08-21 ENCOUNTER — Encounter: Payer: Self-pay | Admitting: Internal Medicine

## 2023-08-21 VITALS — BP 122/72 | HR 65 | Ht 72.0 in | Wt 212.6 lb

## 2023-08-21 DIAGNOSIS — E785 Hyperlipidemia, unspecified: Secondary | ICD-10-CM

## 2023-08-21 DIAGNOSIS — J4489 Other specified chronic obstructive pulmonary disease: Secondary | ICD-10-CM

## 2023-08-21 DIAGNOSIS — I10 Essential (primary) hypertension: Secondary | ICD-10-CM

## 2023-08-21 DIAGNOSIS — R7303 Prediabetes: Secondary | ICD-10-CM

## 2023-08-21 DIAGNOSIS — I712 Thoracic aortic aneurysm, without rupture, unspecified: Secondary | ICD-10-CM

## 2023-08-21 DIAGNOSIS — L509 Urticaria, unspecified: Secondary | ICD-10-CM

## 2023-08-21 DIAGNOSIS — Z7985 Long-term (current) use of injectable non-insulin antidiabetic drugs: Secondary | ICD-10-CM | POA: Diagnosis not present

## 2023-08-21 DIAGNOSIS — E669 Obesity, unspecified: Secondary | ICD-10-CM

## 2023-08-21 DIAGNOSIS — H532 Diplopia: Secondary | ICD-10-CM

## 2023-08-21 DIAGNOSIS — E1159 Type 2 diabetes mellitus with other circulatory complications: Secondary | ICD-10-CM

## 2023-08-21 DIAGNOSIS — E1169 Type 2 diabetes mellitus with other specified complication: Secondary | ICD-10-CM

## 2023-08-21 DIAGNOSIS — I152 Hypertension secondary to endocrine disorders: Secondary | ICD-10-CM

## 2023-08-21 LAB — COMPREHENSIVE METABOLIC PANEL WITH GFR
ALT: 21 U/L (ref 0–53)
AST: 19 U/L (ref 0–37)
Albumin: 4 g/dL (ref 3.5–5.2)
Alkaline Phosphatase: 69 U/L (ref 39–117)
BUN: 21 mg/dL (ref 6–23)
CO2: 33 meq/L — ABNORMAL HIGH (ref 19–32)
Calcium: 8.7 mg/dL (ref 8.4–10.5)
Chloride: 102 meq/L (ref 96–112)
Creatinine, Ser: 1.13 mg/dL (ref 0.40–1.50)
GFR: 65.12 mL/min (ref >60.00)
Glucose, Bld: 93 mg/dL (ref 70–99)
Potassium: 3.6 meq/L (ref 3.5–5.1)
Sodium: 141 meq/L (ref 135–145)
Total Bilirubin: 0.8 mg/dL (ref 0.2–1.2)
Total Protein: 6.8 g/dL (ref 6.0–8.3)

## 2023-08-21 LAB — LIPID PANEL
Cholesterol: 132 mg/dL (ref 0–200)
HDL: 40.6 mg/dL (ref >39.00)
LDL Cholesterol: 75 mg/dL (ref 0–99)
NonHDL: 91.87
Total CHOL/HDL Ratio: 3
Triglycerides: 83 mg/dL (ref 0.0–149.0)
VLDL: 16.6 mg/dL (ref 0.0–40.0)

## 2023-08-21 LAB — MICROALBUMIN / CREATININE URINE RATIO
Creatinine,U: 154.3 mg/dL
Microalb Creat Ratio: 9.4 mg/g (ref 0.0–30.0)
Microalb, Ur: 1.5 mg/dL (ref 0.0–1.9)

## 2023-08-21 LAB — LDL CHOLESTEROL, DIRECT: Direct LDL: 74 mg/dL

## 2023-08-21 LAB — HEMOGLOBIN A1C: Hgb A1c MFr Bld: 6.1 % (ref 4.6–6.5)

## 2023-08-21 MED ORDER — CELECOXIB 200 MG PO CAPS: ORAL_CAPSULE | ORAL | 0 refills | Status: DC

## 2023-08-21 MED ORDER — FAMOTIDINE 20 MG PO TABS: 20.0000 mg | ORAL_TABLET | Freq: Two times a day (BID) | ORAL | 3 refills | Status: DC

## 2023-08-21 MED ORDER — MONTELUKAST SODIUM 10 MG PO TABS: 10.0000 mg | ORAL_TABLET | Freq: Every day | ORAL | 3 refills | Status: AC

## 2023-08-21 MED ORDER — TIRZEPATIDE 10 MG/0.5ML ~~LOC~~ SOAJ: 10.0000 mg | SUBCUTANEOUS | 2 refills | Status: DC

## 2023-08-21 NOTE — Patient Instructions (Addendum)
 For the hives  Singulair  one daily  Famotidine  twice daily  Zyrtec 2 to 4 times as needed   End your showers with a cold rinse    For chronic joint pain:  You can take 3000 mg of acetominophen (tylenol ) every day safely  In divided doses (1000 mg every 8 hours.)  tylenol  is safer than celebrex !  So max out the tylenol  first

## 2023-08-21 NOTE — Assessment & Plan Note (Signed)
He has been asymptomatic on current regimen ?

## 2023-08-21 NOTE — Assessment & Plan Note (Addendum)
 Diagnosed in October  2023 with A1c of 6.5  , with improved A1c with Mounjaro ,  but his weight  loss has plateaued with 7.5 mg dose will increase to 10 mg weekly. .  Continue follow up with Dr Iva    Lab Results  Component Value Date   HGBA1C 6.1 08/21/2023   Lab Results  Component Value Date   MICROALBUR 1.5 08/21/2023

## 2023-08-21 NOTE — Assessment & Plan Note (Signed)
 Increasing mounjaro  to 10 mg weekly today

## 2023-08-21 NOTE — Assessment & Plan Note (Signed)
 Well controlled on current regimen.with increased toprol dose to 50 mg daily.   Renal function stable, no changes today.  Lab Results  Component Value Date   MICROALBUR 1.5 08/21/2023     Lab Results  Component Value Date   CREATININE 1.13 08/21/2023   Lab Results  Component Value Date   NA 141 08/21/2023   K 3.6 08/21/2023   CL 102 08/21/2023   CO2 33 (H) 08/21/2023

## 2023-08-21 NOTE — Assessment & Plan Note (Addendum)
 Symptoms have been occurring nocturnally  for the past 4 months and lhave involved chest wall as well..  may be precipitated by hot showers at [place].  Advised to resume zurtec up to 4 times daily,  famotidine  20 mg bid and singulair ,   Follow up with Stephane Doughty his dermatologist in one month

## 2023-08-21 NOTE — Progress Notes (Signed)
 Subjective:  Patient ID: Charles Mccann, male    DOB: 09/23/1950  Age: 73 y.o. MRN: [id number]  CC: The primary encounter diagnosis was Primary hypertension. Diagnoses of Hyperlipidemia with target LDL less than 100, Prediabetes, Obesity, diabetes, and hypertension syndrome (HCC), Hives, Thoracic aortic aneurysm without rupture, unspecified part (HCC), Asthma-chronic obstructive pulmonary disease overlap syndrome (HCC), Long-term current use of injectable noninsulin antidiabetic medication, and Transient diplopia were also pertinent to this visit.   HPI Charles Mccann Charles Mccann Charles Mccann Mccann presents for  Chief Complaint  Patient presents with   Medical Management of Chronic Issues    6 month follow up    Hives:  recurrent.  Stopped twice daily famotidine  during COVD infection ,  not sure he is taking siingulair either.  Hives occurring at night on chest . After showering   Dizziness:   occurring daily as a feeling of eyes crossing double vision.  Then resolves after < 5 min.   Has become more freqeunt (daily) but less intense)   Has seen cardiololgy and ENT.   Sees neurology in 2 weeks,  no prior MI Rbrain   Type 2 DM /obesity:  He feels generally well, is exercising several times per week and checking blood sugars once daily at variable times.  BS have been under 130 fasting and < 150 post prandially.  Denies any recent hypoglyemic events.  Taking his medications as directed. Following a carbohydrate modified diet 6 days per week. Denies numbness, burning and tingling of extremities. Appetite is good despite  taking mounajro 7.5 mg.  Not losing weight.  Tolerating without symptoms of nausea.  Discussed increasing dose to 10 mg  Not fastin today: english muffin with strawerry jam  Outpatient Medications Prior to Visit  Medication Sig Dispense Refill   albuterol  (PROVENTIL  HFA;VENTOLIN  HFA) 108 (90 BASE) MCG/ACT inhaler Inhale 2 puffs into the lungs every 6 (six) hours as needed.     atorvastatin  (LIPITOR)  20 MG tablet TAKE 1 TABLET BY MOUTH DAILY. 90 tablet 3   azelastine (ASTELIN) 0.1 % nasal spray Place into the nose.     budesonide-formoterol (SYMBICORT) 160-4.5 MCG/ACT inhaler Inhale 2 puffs into the lungs 2 (two) times daily.     Cholecalciferol (VITAMIN D ) 2000 units CAPS Take 2,000 Units by mouth every morning.      diphenhydrAMINE (BENADRYL) 25 mg capsule Take 25 mg by mouth at bedtime.     fluticasone (FLONASE) 50 MCG/ACT nasal spray Place 2 sprays into both nostrils at bedtime.      hydrochlorothiazide  (HYDRODIURIL ) 25 MG tablet TAKE ONE TABLET BY MOUTH EVERY DAY 90 tablet 3   metoprolol succinate (TOPROL-XL) 50 MG 24 hr tablet Take 50 mg by mouth daily.     omeprazole (PRILOSEC) 40 MG capsule Take by mouth.     Potassium 99 MG TABS Take 2 tablets by mouth daily. (Patient taking differently: Take 1 tablet by mouth daily.)     predniSONE  (DELTASONE ) 5 MG tablet Take 5 mg by mouth daily with breakfast.     celecoxib  (CELEBREX ) 200 MG capsule TAKE 1 CAPSULE BY MOUTH TWICE DAILY AS NEEDED 180 capsule 0   montelukast  (SINGULAIR ) 10 MG tablet Take 10 mg by mouth at bedtime.      tirzepatide  (MOUNJARO ) 7.5 MG/0.5ML Pen Inject 7.5 mg into the skin once a week. 6 mL 2   cetirizine (ZYRTEC) 10 MG chewable tablet Chew 10 mg by mouth daily. (Patient not taking: Reported on 08/21/2023)     famotidine  (  PEPCID ) 20 MG tablet Take 1 tablet (20 mg total) by mouth 2 (two) times daily. 60 tablet 2   hydrocortisone 2.5 % ointment Apply topically.     predniSONE  (DELTASONE ) 10 MG tablet 6 tablets on Day 1 , then reduce by 1 tablet daily until gone 21 tablet 0   No facility-administered medications prior to visit.    Review of Systems;  Patient denies headache, fevers, malaise, unintentional weight loss, skin rash, eye pain, sinus congestion and sinus pain, sore throat, dysphagia,  hemoptysis , cough, dyspnea, wheezing, chest pain, palpitations, orthopnea, edema, abdominal pain, nausea, melena, diarrhea,  constipation, flank pain, dysuria, hematuria, urinary  Frequency, nocturia, numbness, tingling, seizures,  Focal weakness, Loss of consciousness,  Tremor, insomnia, depression, anxiety, and suicidal ideation.      Objective:  BP 122/72   Pulse 65   Ht 6' (1.829 m)   Wt 212 lb 9.6 oz (96.4 kg)   SpO2 97%   BMI 28.83 kg/m   BP Readings from Last 3 Encounters:  08/21/23 122/72  02/14/23 130/70  06/02/22 138/76    Wt Readings from Last 3 Encounters:  08/21/23 212 lb 9.6 oz (96.4 kg)  06/04/23 210 lb 9.6 oz (95.5 kg)  02/14/23 210 lb 9.6 oz (95.5 kg)    Physical Exam Vitals reviewed.  Constitutional:      General: He is not in acute distress.    Appearance: Normal appearance. He is normal weight. He is not ill-appearing, toxic-appearing or diaphoretic.  HENT:     Head: Normocephalic.  Eyes:     General: No scleral icterus.       Right eye: No discharge.        Left eye: No discharge.     Conjunctiva/sclera: Conjunctivae normal.  Cardiovascular:     Rate and Rhythm: Normal rate and regular rhythm.     Pulses: Normal pulses.     Heart sounds: Murmur heard.  Pulmonary:     Effort: Pulmonary effort is normal. No respiratory distress.     Breath sounds: Normal breath sounds.  Musculoskeletal:        General: Normal range of motion.     Cervical back: Normal range of motion.  Skin:    General: Skin is warm and dry.  Neurological:     General: No focal deficit present.     Mental Status: He is alert and oriented to person, place, and time. Mental status is at baseline.  Psychiatric:        Mood and Affect: Mood normal.        Behavior: Behavior normal.        Thought Content: Thought content normal.        Judgment: Judgment normal.     Lab Results  Component Value Date   HGBA1C 6.1 08/21/2023   HGBA1C 6.2 02/14/2023   HGBA1C 5.9 06/02/2022    Lab Results  Component Value Date   CREATININE 1.13 08/21/2023   CREATININE 1.10 02/14/2023   CREATININE 1.06  06/02/2022    Lab Results  Component Value Date   WBC 8.7 02/14/2023   HGB 14.6 02/14/2023   HCT 43.4 02/14/2023   PLT 279.0 02/14/2023   GLUCOSE 93 08/21/2023   CHOL 132 08/21/2023   TRIG 83.0 08/21/2023   HDL 40.60 08/21/2023   LDLDIRECT 74.0 08/21/2023   LDLCALC 75 08/21/2023   ALT 21 08/21/2023   AST 19 08/21/2023   NA 141 08/21/2023   K 3.6 08/21/2023   CL 102  08/21/2023   CREATININE 1.13 08/21/2023   BUN 21 08/21/2023   CO2 33 (H) 08/21/2023   TSH 3.11 02/14/2023   PSA 1.19 02/14/2023   HGBA1C 6.1 08/21/2023   MICROALBUR 1.5 08/21/2023    US  LT LOWER EXTREM LTD SOFT TISSUE NON VASCULAR Result Date: 10/13/2022 CLINICAL DATA:  Left anterior knee pain below the patella. Evaluate for tendon rupture. History of total knee replacement. EXAM: ULTRASOUND LEFT LOWER EXTREMITY LIMITED TECHNIQUE: Ultrasound examination of the lower extremity soft tissues was performed in the area of clinical concern. COMPARISON:  None Available. FINDINGS: Intact left quadriceps and patellar tendons. Small to moderate bilateral knee joint effusions. IMPRESSION: 1. Intact left extensor mechanism. 2. Small to moderate bilateral knee joint effusions. Electronically Signed   By: Elsie ONEIDA Shoulder M.D.   On: 10/13/2022 13:34    Assessment & Plan:  .Primary hypertension Assessment & Plan: Well controlled on current regimen.with increased toprol dose to 50 mg daily.   Renal function stable, no changes today.  Lab Results  Component Value Date   MICROALBUR 1.5 08/21/2023     Lab Results  Component Value Date   CREATININE 1.13 08/21/2023   Lab Results  Component Value Date   NA 141 08/21/2023   K 3.6 08/21/2023   CL 102 08/21/2023   CO2 33 (H) 08/21/2023     Orders: -     Microalbumin / creatinine urine ratio -     Comprehensive metabolic panel with GFR  Hyperlipidemia with target LDL less than 100 Assessment & Plan: He has multiple coronary calcifications on recent CT.  Continue current  statin dose  to keep LDL < 70  Lab Results  Component Value Date   CHOL 132 08/21/2023   HDL 40.60 08/21/2023   LDLCALC 75 08/21/2023   LDLDIRECT 74.0 08/21/2023   TRIG 83.0 08/21/2023   CHOLHDL 3 08/21/2023     Orders: -     Lipid panel -     LDL cholesterol, direct  Prediabetes -     Hemoglobin A1c -     Comprehensive metabolic panel with GFR  Obesity, diabetes, and hypertension syndrome (HCC) Assessment & Plan: Diagnosed in October  2023 with A1c of 6.5  , with improved A1c with Mounjaro ,  but his weight  loss has plateaued with 7.5 mg dose will increase to 10 mg weekly. .  Continue follow up with Dr Dingledein    Lab Results  Component Value Date   HGBA1C 6.1 08/21/2023   Lab Results  Component Value Date   MICROALBUR 1.5 08/21/2023        Hives Assessment & Plan: Symptoms have been occurring nocturnally  for the past 4 months and lhave involved chest wall as well..  may be precipitated by hot showers at hight.  Advised to resume zurtec up to 4 times daily,  famotidine  20 mg bid and singulair ,   Follow up with Stephane Doughty his dermatologist in one month    Thoracic aortic aneurysm without rupture, unspecified part [facility]) Assessment & Plan: Considered to be a late complication of TOF repair. His aortic aneurysm is stable by repeat CT angiography done in April by  [facility] .  Toprol dose was increased at March visit to keep BP at 120/70 or less    Asthma-chronic obstructive pulmonary disease overlap syndrome (HCC) Assessment & Plan: He has been asymptomatic on current regimen   Long-term current use of injectable noninsulin antidiabetic medication Assessment & Plan: Increasing mounjaro  to 10 mg weekly today  Transient diplopia Assessment & Plan: Occurring for a few seconds during infrequent episodes of dizziness,   now occurring once daily   No headaches,  tachycardia or presyncope.SABRA  Neurologic exam is normal.  He defers MRI and is seeing neurology in 2  weeks    Other orders -     Celecoxib ; TAKE 1 CAPSULE BY MOUTH TWICE DAILY AS NEEDED  Dispense: 180 capsule; Refill: 0 -     Tirzepatide ; Inject 10 mg into the skin once a week.  Dispense: 6 mL; Refill: 2 -     Famotidine ; Take 1 tablet (20 mg total) by mouth 2 (two) times daily.  Dispense: 180 tablet; Refill: 3 -     Montelukast  Sodium; Take 1 tablet (10 mg total) by mouth at bedtime.  Dispense: 90 tablet; Refill: 3     I spent 34 minutes on the day of this face to face encounter reviewing patient's  most recent visit with cardiology,  ENT, pulmonology,  prior relevant surgical and non surgical procedures, recent  labs and imaging studies, counseling on weight management,  reviewing the assessment and plan with patient, and post visit ordering and reviewing of  diagnostics and therapeutics with patient  .   Follow-up: Return in about 6 months (around 02/21/2024) for follow up diabetes, physical.   Charles LITTIE Kettering, MD

## 2023-08-21 NOTE — Assessment & Plan Note (Signed)
 Occurring for a few seconds during infrequent episodes of dizziness,   now occurring once daily   No headaches,  tachycardia or presyncope.SABRA  Neurologic exam is normal.  He defers MRI and is seeing neurology in 2 weeks

## 2023-08-21 NOTE — Assessment & Plan Note (Addendum)
 Considered to be a late complication of TOF repair. His aortic aneurysm is stable by repeat CT angiography done in April by  [facility] .  Toprol dose was increased at March visit to keep BP at 120/70 or less

## 2023-08-21 NOTE — Assessment & Plan Note (Signed)
 He has multiple coronary calcifications on recent CT.  Continue current statin dose  to keep LDL < 70  Lab Results  Component Value Date   CHOL 132 08/21/2023   HDL 40.60 08/21/2023   LDLCALC 75 08/21/2023   LDLDIRECT 74.0 08/21/2023   TRIG 83.0 08/21/2023   CHOLHDL 3 08/21/2023

## 2023-08-22 DIAGNOSIS — G4733 Obstructive sleep apnea (adult) (pediatric): Secondary | ICD-10-CM | POA: Diagnosis not present

## 2023-08-23 ENCOUNTER — Ambulatory Visit: Payer: Self-pay | Admitting: Internal Medicine

## 2023-08-23 DIAGNOSIS — K080 Exfoliation of teeth due to systemic causes: Secondary | ICD-10-CM | POA: Diagnosis not present

## 2023-09-10 DIAGNOSIS — G43109 Migraine with aura, not intractable, without status migrainosus: Secondary | ICD-10-CM | POA: Diagnosis not present

## 2023-09-10 DIAGNOSIS — Z1331 Encounter for screening for depression: Secondary | ICD-10-CM | POA: Diagnosis not present

## 2023-09-10 DIAGNOSIS — G3184 Mild cognitive impairment, so stated: Secondary | ICD-10-CM | POA: Diagnosis not present

## 2023-09-14 ENCOUNTER — Other Ambulatory Visit: Payer: Self-pay | Admitting: Neurology

## 2023-09-14 DIAGNOSIS — G3184 Mild cognitive impairment, so stated: Secondary | ICD-10-CM

## 2023-09-22 DIAGNOSIS — G4733 Obstructive sleep apnea (adult) (pediatric): Secondary | ICD-10-CM | POA: Diagnosis not present

## 2023-09-24 ENCOUNTER — Ambulatory Visit
Admission: RE | Admit: 2023-09-24 | Discharge: 2023-09-24 | Disposition: A | Discharge status: Home / Self Care | Source: Ambulatory Visit | Attending: Neurology | Admitting: Neurology

## 2023-09-24 DIAGNOSIS — Z471 Aftercare following joint replacement surgery: Secondary | ICD-10-CM | POA: Diagnosis not present

## 2023-09-24 DIAGNOSIS — R9082 White matter disease, unspecified: Secondary | ICD-10-CM | POA: Diagnosis not present

## 2023-09-24 DIAGNOSIS — G3184 Mild cognitive impairment, so stated: Secondary | ICD-10-CM | POA: Diagnosis not present

## 2023-10-05 ENCOUNTER — Observation Stay
Admission: EM | Admit: 2023-10-05 | Discharge: 2023-10-06 | Disposition: A | Discharge status: Home / Self Care | Source: Ambulatory Visit | Attending: Internal Medicine | Admitting: Internal Medicine

## 2023-10-05 ENCOUNTER — Other Ambulatory Visit: Payer: Self-pay

## 2023-10-05 ENCOUNTER — Encounter: Payer: Self-pay | Admitting: Intensive Care

## 2023-10-05 DIAGNOSIS — E119 Type 2 diabetes mellitus without complications: Secondary | ICD-10-CM | POA: Insufficient documentation

## 2023-10-05 DIAGNOSIS — I739 Peripheral vascular disease, unspecified: Secondary | ICD-10-CM | POA: Diagnosis not present

## 2023-10-05 DIAGNOSIS — I6389 Other cerebral infarction: Principal | ICD-10-CM | POA: Insufficient documentation

## 2023-10-05 DIAGNOSIS — Z6829 Body mass index (BMI) 29.0-29.9, adult: Secondary | ICD-10-CM | POA: Insufficient documentation

## 2023-10-05 DIAGNOSIS — I639 Cerebral infarction, unspecified: Secondary | ICD-10-CM | POA: Diagnosis not present

## 2023-10-05 DIAGNOSIS — J449 Chronic obstructive pulmonary disease, unspecified: Secondary | ICD-10-CM | POA: Insufficient documentation

## 2023-10-05 DIAGNOSIS — I10 Essential (primary) hypertension: Secondary | ICD-10-CM | POA: Insufficient documentation

## 2023-10-05 DIAGNOSIS — I77810 Thoracic aortic ectasia: Secondary | ICD-10-CM | POA: Diagnosis not present

## 2023-10-05 DIAGNOSIS — Z79899 Other long term (current) drug therapy: Secondary | ICD-10-CM | POA: Diagnosis not present

## 2023-10-05 DIAGNOSIS — Z8673 Personal history of transient ischemic attack (TIA), and cerebral infarction without residual deficits: Secondary | ICD-10-CM | POA: Insufficient documentation

## 2023-10-05 DIAGNOSIS — R42 Dizziness and giddiness: Secondary | ICD-10-CM | POA: Diagnosis not present

## 2023-10-05 DIAGNOSIS — E785 Hyperlipidemia, unspecified: Secondary | ICD-10-CM | POA: Insufficient documentation

## 2023-10-05 DIAGNOSIS — G4733 Obstructive sleep apnea (adult) (pediatric): Secondary | ICD-10-CM | POA: Diagnosis not present

## 2023-10-05 DIAGNOSIS — F10920 Alcohol use, unspecified with intoxication, uncomplicated: Secondary | ICD-10-CM | POA: Insufficient documentation

## 2023-10-05 DIAGNOSIS — K219 Gastro-esophageal reflux disease without esophagitis: Secondary | ICD-10-CM | POA: Diagnosis not present

## 2023-10-05 DIAGNOSIS — I351 Nonrheumatic aortic (valve) insufficiency: Secondary | ICD-10-CM | POA: Diagnosis not present

## 2023-10-05 DIAGNOSIS — E669 Obesity, unspecified: Secondary | ICD-10-CM | POA: Diagnosis not present

## 2023-10-05 HISTORY — DX: Cerebral infarction, unspecified: I63.9

## 2023-10-05 LAB — COMPREHENSIVE METABOLIC PANEL WITH GFR
ALT: 15 U/L (ref 0–44)
AST: 18 U/L (ref 15–41)
Albumin: 3.5 g/dL (ref 3.5–5.0)
Alkaline Phosphatase: 55 U/L (ref 38–126)
Anion gap: 12 (ref 5–15)
BUN: 20 mg/dL (ref 8–23)
CO2: 28 mmol/L (ref 22–32)
Calcium: 9.4 mg/dL (ref 8.9–10.3)
Chloride: 100 mmol/L (ref 98–111)
Creatinine, Ser: 1.52 mg/dL — ABNORMAL HIGH (ref 0.61–1.24)
GFR, Estimated: 48 mL/min — ABNORMAL LOW (ref >60)
Glucose, Bld: 118 mg/dL — ABNORMAL HIGH (ref 70–99)
Potassium: 3.5 mmol/L (ref 3.5–5.1)
Sodium: 140 mmol/L (ref 135–145)
Total Bilirubin: 0.8 mg/dL (ref 0.0–1.2)
Total Protein: 6.5 g/dL (ref 6.5–8.1)

## 2023-10-05 LAB — PROTIME-INR
INR: 1 (ref 0.8–1.2)
Prothrombin Time: 14.1 s (ref 11.4–15.2)

## 2023-10-05 LAB — DIFFERENTIAL
Abs Immature Granulocytes: 0.03 K/uL (ref 0.00–0.07)
Basophils Absolute: 0.1 K/uL (ref 0.0–0.1)
Basophils Relative: 1 %
Eosinophils Absolute: 0.3 K/uL (ref 0.0–0.5)
Eosinophils Relative: 4 %
Immature Granulocytes: 0 %
Lymphocytes Relative: 27 %
Lymphs Abs: 2 K/uL (ref 0.7–4.0)
Monocytes Absolute: 0.5 K/uL (ref 0.1–1.0)
Monocytes Relative: 7 %
Neutro Abs: 4.5 K/uL (ref 1.7–7.7)
Neutrophils Relative %: 61 %

## 2023-10-05 LAB — CBC
HCT: 38.9 % — ABNORMAL LOW (ref 39.0–52.0)
Hemoglobin: 13.1 g/dL (ref 13.0–17.0)
MCH: 31.2 pg (ref 26.0–34.0)
MCHC: 33.7 g/dL (ref 30.0–36.0)
MCV: 92.6 fL (ref 80.0–100.0)
Platelets: 235 K/uL (ref 150–400)
RBC: 4.2 MIL/uL — ABNORMAL LOW (ref 4.22–5.81)
RDW: 13.2 % (ref 11.5–15.5)
WBC: 7.4 K/uL (ref 4.0–10.5)
nRBC: 0 % (ref 0.0–0.2)

## 2023-10-05 LAB — APTT: aPTT: 28 s (ref 24–36)

## 2023-10-05 LAB — ETHANOL: Alcohol, Ethyl (B): <15 mg/dL (ref <15)

## 2023-10-05 MED ORDER — PREDNISONE 10 MG (21) PO TBPK: 10.0000 mg | ORAL_TABLET | ORAL | Status: DC

## 2023-10-05 MED ORDER — IPRATROPIUM-ALBUTEROL 0.5-2.5 (3) MG/3ML IN SOLN: 3.0000 mL | Freq: Four times a day (QID) | RESPIRATORY_TRACT | Status: DC | PRN

## 2023-10-05 MED ORDER — ACETAMINOPHEN 325 MG PO TABS: 650.0000 mg | ORAL_TABLET | ORAL | Status: DC | PRN

## 2023-10-05 MED ORDER — PREDNISONE 10 MG (21) PO TBPK: 20.0000 mg | ORAL_TABLET | Freq: Every evening | ORAL | Status: DC

## 2023-10-05 MED ORDER — ATORVASTATIN CALCIUM 20 MG PO TABS
80.0000 mg | ORAL_TABLET | Freq: Every day | ORAL | Status: DC
  Administered 2023-10-05 – 2023-10-06 (×2): 80 mg via ORAL
  Filled 2023-10-05 (×2): qty 4

## 2023-10-05 MED ORDER — METHYLPREDNISOLONE SODIUM SUCC 125 MG IJ SOLR
125.0000 mg | Freq: Once | INTRAMUSCULAR | Status: AC
  Administered 2023-10-05: 125 mg via INTRAVENOUS

## 2023-10-05 MED ORDER — PREDNISONE 10 MG (21) PO TBPK: 10.0000 mg | ORAL_TABLET | Freq: Four times a day (QID) | ORAL | Status: DC

## 2023-10-05 MED ORDER — METOPROLOL SUCCINATE ER 50 MG PO TB24
50.0000 mg | ORAL_TABLET | Freq: Every day | ORAL | Status: DC
  Administered 2023-10-05 – 2023-10-06 (×2): 50 mg via ORAL
  Filled 2023-10-05 (×2): qty 1

## 2023-10-05 MED ORDER — FLUTICASONE PROPIONATE 50 MCG/ACT NA SUSP: 1.0000 | Freq: Every evening | NASAL | Status: DC | PRN

## 2023-10-05 MED ORDER — ALBUTEROL SULFATE (2.5 MG/3ML) 0.083% IN NEBU: 2.5000 mg | INHALATION_SOLUTION | RESPIRATORY_TRACT | Status: DC | PRN

## 2023-10-05 MED ORDER — ACETAMINOPHEN 160 MG/5ML PO SOLN: 650.0000 mg | ORAL | Status: DC | PRN

## 2023-10-05 MED ORDER — DIPHENHYDRAMINE HCL 50 MG/ML IJ SOLN
50.0000 mg | Freq: Once | INTRAMUSCULAR | Status: AC
  Administered 2023-10-05: 50 mg via INTRAVENOUS
  Filled 2023-10-05: qty 1

## 2023-10-05 MED ORDER — PREDNISONE 10 MG (21) PO TBPK
20.0000 mg | ORAL_TABLET | Freq: Every morning | ORAL | Status: AC
  Filled 2023-10-05: qty 21

## 2023-10-05 MED ORDER — ASPIRIN 81 MG PO TBEC
81.0000 mg | DELAYED_RELEASE_TABLET | Freq: Every day | ORAL | Status: DC
  Administered 2023-10-06: 81 mg via ORAL
  Filled 2023-10-05: qty 1

## 2023-10-05 MED ORDER — ENOXAPARIN SODIUM 40 MG/0.4ML IJ SOSY
40.0000 mg | PREFILLED_SYRINGE | INTRAMUSCULAR | Status: DC
  Filled 2023-10-05: qty 0.4

## 2023-10-05 MED ORDER — BUDESONIDE 0.25 MG/2ML IN SUSP
0.2500 mg | Freq: Two times a day (BID) | RESPIRATORY_TRACT | Status: DC
  Administered 2023-10-06: 0.25 mg via RESPIRATORY_TRACT
  Filled 2023-10-05: qty 2

## 2023-10-05 MED ORDER — STROKE: EARLY STAGES OF RECOVERY BOOK: Freq: Once | Status: AC

## 2023-10-05 MED ORDER — AZELASTINE HCL 0.1 % NA SOLN
1.0000 | Freq: Every day | NASAL | Status: DC
  Administered 2023-10-06: 1 via NASAL
  Filled 2023-10-05: qty 30

## 2023-10-05 MED ORDER — FAMOTIDINE 20 MG PO TABS
20.0000 mg | ORAL_TABLET | Freq: Every day | ORAL | Status: DC
  Administered 2023-10-06: 20 mg via ORAL
  Filled 2023-10-05 (×2): qty 1

## 2023-10-05 MED ORDER — METHYLPREDNISOLONE SODIUM SUCC 125 MG IJ SOLR
60.0000 mg | Freq: Once | INTRAMUSCULAR | Status: DC
  Filled 2023-10-05: qty 2

## 2023-10-05 MED ORDER — SENNOSIDES-DOCUSATE SODIUM 8.6-50 MG PO TABS: 1.0000 | ORAL_TABLET | Freq: Every evening | ORAL | Status: DC | PRN

## 2023-10-05 MED ORDER — SODIUM CHLORIDE 0.9 % IV SOLN: INTRAVENOUS | Status: DC

## 2023-10-05 MED ORDER — MONTELUKAST SODIUM 10 MG PO TABS
10.0000 mg | ORAL_TABLET | Freq: Every day | ORAL | Status: DC
  Administered 2023-10-05: 10 mg via ORAL
  Filled 2023-10-05: qty 1

## 2023-10-05 MED ORDER — LORATADINE 10 MG PO TABS
10.0000 mg | ORAL_TABLET | Freq: Every day | ORAL | Status: DC
  Administered 2023-10-06: 10 mg via ORAL
  Filled 2023-10-05: qty 1

## 2023-10-05 MED ORDER — PREDNISONE 10 MG (21) PO TBPK
10.0000 mg | ORAL_TABLET | ORAL | Status: AC
  Administered 2023-10-06: 10 mg via ORAL

## 2023-10-05 MED ORDER — PREDNISONE 10 MG (21) PO TBPK
10.0000 mg | ORAL_TABLET | Freq: Three times a day (TID) | ORAL | Status: DC
  Administered 2023-10-06 (×2): 10 mg via ORAL

## 2023-10-05 MED ORDER — ACETAMINOPHEN 650 MG RE SUPP: 650.0000 mg | RECTAL | Status: DC | PRN

## 2023-10-05 MED ORDER — PANTOPRAZOLE SODIUM 40 MG PO TBEC
40.0000 mg | DELAYED_RELEASE_TABLET | Freq: Two times a day (BID) | ORAL | Status: DC
  Administered 2023-10-05 – 2023-10-06 (×2): 40 mg via ORAL
  Filled 2023-10-05 (×2): qty 1

## 2023-10-05 NOTE — H&P (Signed)
 History and Physical    Charles Mccann [name] DOB: [birth year] DOA: 10/05/2023  DOS: the patient was seen and examined on 10/05/2023  PCP: [name], MD   Patient coming from: Home  I have personally briefly reviewed patient's old medical records in [facility]  Chief Complaint: Sent by neurologist for acute stroke  HPI: Charles Mccann is a pleasant 73 y.o. male with medical history significant for COPD, GERD, HTN, HLD, recurrent dizziness/balance issues with negative workup who was brought into ED after he was sent by his neurologist for abnormal MRI.  According to the patient since January he has been experiencing episodes of dizziness and feeling off balance.  Patient states that he has seen his doctor for the same and was referred to ENT for vertigo workup that he says ultimately did not show any significant finding.  Patient was then scheduled a neurology appointment which took approximately 51-month.  Patient had a neurology appointment and outpatient MRI was ordered and performed on 09/24/2023.  Patient received a phone call today saying that his MRI showed a stroke and should go to the ED for evaluation and further workup.  Patient denies any weakness, numbness or speech slurring today.  He states that he still has some off balance symptoms that has not really worsened or improved since January.  He denies any fever, chills, nausea, vomiting, slurring of speech, weakness of upper and lower extremities.  ED Course: Upon arrival to the ED, patient is found to be at his baseline.  There was no weakness on the exam.  MRI which was done outpatient showed punctate foci of restricted diffusion within the centrum ovale bilaterally involving the right posterior frontal lobe and left parietal lobe compatible with acute lacunar infarct.  Neurologist was called for evaluation.  Hospitalist was called for evaluation for admission.  Review of Systems:  ROS  All other systems  negative except as noted in the HPI.  Past Medical History:  Diagnosis Date   Allergy since birth   Arthritis    hands, shoulder, knees   Asthma    managed by [facility]   Blood transfusion without reported diagnosis during heart surgery 1974   Bronchitis, chronic obstructive, with exacerbation (HCC) 04/26/2014   Cataract    both eyes corrected by surgery   COPD (chronic obstructive pulmonary disease) (HCC)    GERD (gastroesophageal reflux disease)    controlled only with nexium priro prevacid failutre   Heart murmur Since birth   Hypercholesterolemia    Hyperlipidemia with target LDL less than 100 05/28/2011   Hypertension    Impotence due to erectile dysfunction 04/26/2014   Left inguinal hernia 04/07/2016   Obstructive sleep apnea of adult 2012   on CPAP  tolerating ,  12 cm H20 , room air    Palpitations 11/27/2011   Sleep apnea CPAP since 2012   Tetralogy of Fallot    Thoracic aortic aneurysm [facility])    Treadmill stress test negative for angina pectoris 2005   Umbilical hernia without obstruction and without gangrene 03/10/2016    Past Surgical History:  Procedure Laterality Date   BRAIN SURGERY  before 5th birthday   CARDIAC CATHETERIZATION     CATARACT EXTRACTION W/PHACO Right 11/16/2014   Procedure: CATARACT EXTRACTION PHACO AND INTRAOCULAR LENS PLACEMENT (IOC);  Surgeon: Steven Dingeldein, MD;  Location: [facility];  Service: Ophthalmology;  Laterality: Right;  Onu#[id number] H US :01:03.9 AP%:24.3% CDE:30.12   CATARACT EXTRACTION W/PHACO Left 02/21/2017   Procedure:  CATARACT EXTRACTION PHACO AND INTRAOCULAR LENS PLACEMENT (IOC) LEFT;  Surgeon: Mittie Gaskin, MD;  Location: [facility];  Service: Ophthalmology;  Laterality: Left;  requests later   COLONOSCOPY WITH PROPOFOL  N/A 06/17/2019   Procedure: COLONOSCOPY WITH PROPOFOL ;  Surgeon: Jinny Carmine, MD;  Location: [facility];  Service: Endoscopy;  Laterality: N/A;   EYE SURGERY  cataract - 2016, 2019    HERNIA REPAIR  triple hernia - 2018   INGUINAL HERNIA REPAIR Left 04/20/2016   Procedure: HERNIA REPAIR INGUINAL ADULT WITH MESH;  Surgeon: Carlin Pastel, MD;  Location: [facility];  Service: General;  Laterality: Left;   JOINT REPLACEMENT  03/01/20, 02/14/21   left knee, right knee replacement   TETRALOGY OF FALLOT REPAIR  1974   [facility] [place]   TONSILLECTOMY     UMBILICAL HERNIA REPAIR N/A 04/20/2016   Procedure: HERNIA REPAIR UMBILICAL ADULT WITH MESH ;  Surgeon: Carlin Pastel, MD;  Location: [facility];  Service: General;  Laterality: N/A;     reports that he has never smoked. He has never used smokeless tobacco. He reports current alcohol use of about 18.0 standard drinks of alcohol per week. He reports that he does not use drugs.  Allergies  Allergen Reactions   Other Swelling, Anaphylaxis and Shortness Of Breath    Finfish - Throat Swells Fin Fish   Fish Allergy Swelling    Finfish - Throat Swells (PT HAS NO PROBLEMS WITH SHELLFISH)    Family History  Problem Relation Age of Onset   Early death Mother    Heart disease Father 64       CAD   Arthritis Father    Cancer Brother 11       liver CA mets to brain    Cancer Brother        colon Ca , stomach CA   Cancer Brother    Cancer Brother     Prior to Admission medications   Medication Sig Start Date End Date Taking? Authorizing Provider  albuterol  (PROVENTIL  HFA;VENTOLIN  HFA) 108 (90 BASE) MCG/ACT inhaler Inhale 2 puffs into the lungs every 6 (six) hours as needed.    [provider]  atorvastatin  (LIPITOR) 20 MG tablet TAKE 1 TABLET BY MOUTH DAILY. 02/06/23   [name], MD  azelastine  (ASTELIN ) 0.1 % nasal spray Place into the nose. 07/29/18   [provider]  budesonide -formoterol (SYMBICORT) 160-4.5 MCG/ACT inhaler Inhale 2 puffs into the lungs 2 (two) times daily.    [provider]  celecoxib  (CELEBREX ) 200 MG capsule TAKE 1 CAPSULE BY MOUTH TWICE DAILY AS NEEDED 08/21/23   [name], MD  Cholecalciferol (VITAMIN D ) 2000 units CAPS Take 2,000 Units by mouth every morning.     [provider]  diphenhydrAMINE  (BENADRYL ) 25 mg capsule Take 25 mg by mouth at bedtime.    [provider]  famotidine  (PEPCID ) 20 MG tablet Take 1 tablet (20 mg total) by mouth 2 (two) times daily. 08/21/23   [name], MD  fluticasone  (FLONASE ) 50 MCG/ACT nasal spray Place 2 sprays into both nostrils at bedtime.     [provider]  hydrochlorothiazide  (HYDRODIURIL ) 25 MG tablet TAKE ONE TABLET BY MOUTH EVERY DAY 11/13/22   [name], MD  metoprolol  succinate (TOPROL -XL) 50 MG 24 hr tablet Take 50 mg by mouth daily. 11/09/20   [provider]  montelukast  (SINGULAIR ) 10 MG tablet Take 1 tablet (10 mg total) by mouth at bedtime. 08/21/23  Tullo, Teresa L, MD  omeprazole (PRILOSEC) 40 MG capsule Take by mouth. 02/06/18 08/21/23  [provider]  Potassium 99 MG TABS Take 2 tablets by mouth daily. Patient taking differently: Take 1 tablet by mouth daily.    [provider]  predniSONE  (DELTASONE ) 5 MG tablet Take 5 mg by mouth daily with breakfast. 08/23/22   [provider]  tirzepatide  (MOUNJARO ) 10 MG/0.5ML Pen Inject 10 mg into the skin once a week. 08/21/23   [name], MD    Physical Exam: Vitals:   10/05/23 1353 10/05/23 1357  BP:  134/84  Pulse:  65  Resp:  16  Temp:  98.7 F (37.1 C)  TempSrc:  Oral  SpO2:  99%  Weight: 97.5 kg   Height: 6' (1.829 m)     Physical Exam   Constitutional: Alert, awake, calm, comfortable HEENT: Neck supple Respiratory: Clear to auscultation B/L, no wheezing, no rales.  Cardiovascular: Regular rate and rhythm, no murmurs / rubs / gallops. No extremity edema. 2+ pedal pulses. No carotid bruits.  Abdomen: Soft, no tenderness, Bowel sounds positive.  Musculoskeletal: no clubbing / cyanosis. Good ROM, no contractures. Normal muscle tone.  Skin: no rashes, lesions,  ulcers. Neurologic: CN 2-12 grossly intact. Sensation intact, No focal deficit identified Psychiatric: Alert and oriented x 3. Normal mood.    Labs on Admission: I have personally reviewed following labs and imaging studies  CBC: Recent Labs  Lab 10/05/23 1358  WBC 7.4  NEUTROABS 4.5  HGB 13.1  HCT 38.9*  MCV 92.6  PLT 235   Basic Metabolic Panel: Recent Labs  Lab 10/05/23 1358  NA 140  K 3.5  CL 100  CO2 28  GLUCOSE 118*  BUN 20  CREATININE 1.52*  CALCIUM  9.4   GFR: Estimated Creatinine Clearance: 53.2 mL/min (A) (by C-G formula based on SCr of 1.52 mg/dL (H)). Liver Function Tests: Recent Labs  Lab 10/05/23 1358  AST 18  ALT 15  ALKPHOS 55  BILITOT 0.8  PROT 6.5  ALBUMIN 3.5   No results for input(s): LIPASE, AMYLASE in the last 168 hours. No results for input(s): AMMONIA in the last 168 hours. Coagulation Profile: Recent Labs  Lab 10/05/23 1358  INR 1.0   Cardiac Enzymes: No results for input(s): CKTOTAL, CKMB, CKMBINDEX, TROPONINI, TROPONINIHS in the last 168 hours. BNP (last 3 results) No results for input(s): BNP in the last 8760 hours. HbA1C: No results for input(s): HGBA1C in the last 72 hours. CBG: No results for input(s): GLUCAP in the last 168 hours. Lipid Profile: No results for input(s): CHOL, HDL, LDLCALC, TRIG, CHOLHDL, LDLDIRECT in the last 72 hours. Thyroid  Function Tests: No results for input(s): TSH, T4TOTAL, FREET4, T3FREE, THYROIDAB in the last 72 hours. Anemia Panel: No results for input(s): VITAMINB12, FOLATE, FERRITIN, TIBC, IRON, RETICCTPCT in the last 72 hours. Urine analysis:    Component Value Date/Time   COLORURINE YELLOW 02/14/2023 1109   APPEARANCEUR CLEAR 02/14/2023 1109   LABSPEC 1.010 02/14/2023 1109   PHURINE 7.0 02/14/2023 1109   GLUCOSEU NEGATIVE 02/14/2023 1109   HGBUR NEGATIVE 02/14/2023 1109   BILIRUBINUR NEGATIVE 02/14/2023 1109   KETONESUR  NEGATIVE 02/14/2023 1109   UROBILINOGEN 0.2 02/14/2023 1109   NITRITE NEGATIVE 02/14/2023 1109   LEUKOCYTESUR NEGATIVE 02/14/2023 1109    Radiological Exams on Admission: I have personally reviewed images No results found.  EKG: My personal interpretation of EKG shows: Sinus rhythm with PVCs    Assessment/Plan Principal Problem:  Acute stroke due to ischemia [facility]) Active Problems:   Hyperlipidemia with target LDL less than 100   Hypertension   Obesity, diabetes, and hypertension syndrome (HCC)    Assessment and Plan: 73 year old male W/PMH of HTN, HLD, obesity and ongoing dizziness/balance issues was sent for positive MRI for stroke by neurologist.  1.  Acute ischemic stroke - On-call neurologist Dr. Lindzen was called for evaluation - Patient will be admitted to hospital as inpatient for acute stroke - Telemetry, stroke workup including lipid panel, echocardiogram, PT/OT/ST - Add aspirin  - Follow the recommendation from neurology - Neurologist believe this may be related to embolic event, however, patient stated that he had been evaluated for those even including TEE by his providers and which was negative. - Depending on the patient's finding and recommendation by neurologist may need cardiologist for TEE  2.  HTN/HLD - Patient will be on permissive hypertension today - Will continue statin - Continue monitor blood pressure  3.  COPD - Stable  4.  GERD - Stable continue home medication     DVT prophylaxis: Lovenox  Code Status: Full Code Family Communication: Patient and wife discussed in detail Disposition Plan: Home Consults called: Neurology Admission status: Inpatient, Telemetry bed   Nena Rebel, MD [facility] 10/05/2023, 4:26 PM

## 2023-10-05 NOTE — Significant Event (Signed)
       CROSS COVER NOTE  NAME: Charles Mccann MRN: [id number] DOB : 05-30-1950 ATTENDING PHYSICIAN: Roann Gouty, MD    Date of Service   10/05/2023   HPI/Events of Note   Message received from RN Patient admitted for a stroke noted on MRI. His NIH is 0. The problem now is that he has hives on arms, back, face, and chest/abdomin. States did not have prior to admission. His only listed allergy is finfish.   Patient admitted for cva, no neuro deficits PMH reviewed  Interventions   Assessment/Plan:    10/05/2023    9:00 PM 10/05/2023    6:01 PM 10/05/2023    4:35 PM  Vitals with BMI  Systolic 134 131 864  Diastolic 76 80 105  Pulse 68 66 71    Patient alert and oriented x 4 no feuro deficits  Impressive hives back ches arms and some facial redness Endorses itching and lip tingling and wheezing States he has been having severe allergy reactions every evening that he manages with antihistamines and takes daily prednisone  5 mg for his asthma. He has had severe enough reactions in past that affected his breathing but not to needing on ventilator.  Has only seen pulmonologist who said it was weeds and grass he ws allergic to. He has appointment 9/18 with allergist  Solumedrol 125 mg x 1 start prednisone  dose pack in am 50 mg benadryl  iv x 1 Restart home antihistamine regimen zyrtec singulair , astein, flonase   Home asthma meds symbicort and albuterol  Recommend rx for epipen  at discharge      Erminio LITTIE Cone NP [facility] Cross Cover 7pm-7am - check amion for availability Pager [phone]

## 2023-10-05 NOTE — ED Triage Notes (Signed)
 Patient presents with blurry vision and unsteady X6 months ago. Dizziness and nausea started recently.  Six months ago had blood work done and referred to neurologist who then ordered MRI. Patient had MRI two weeks ago and got a phone call that it read stroke and needed to come to ER.   Patient ambulatory in triage with NAD noted.

## 2023-10-05 NOTE — Consult Note (Signed)
 NEUROLOGY CONSULT NOTE   Date of service: October 05, 2023 Patient Name: Charles Mccann MRN:  [id number] DOB:  1950/04/10 Chief Complaint: Stroke on MRI Requesting Provider: Dorothyann Drivers, MD  History of Present Illness  Charles Mccann is a 73 y.o. male with a PMHx of asthma, arthritis, brain surgery in early childhood, chronic obstructive bronchitis, bilateral cataracts s/p surgical correction, COPD, GERD, Heart murmur, hypercholesterolemia, HLD, HTN, erectile dysfunction, left inguinal hernia, OSA on CPAP, palpitations, tetralogy of Fallot s/p surgical repair, thoracic aortic aneurysm and umbilical hernia who presented to the ED this afternoon after outpatient MRI had revealed subacute strokes. He initially had experienced blurry vision and unsteady gait starting about 6 months ago, then dizziness and nausea more recently. He was referred to a Neurologist who ordered an MRI that was performed two weeks ago. He got a phone call that the MRI showed a stroke and that he needed to come to the ER.   Home medications include atorvastatin .    ROS  Denies any current weakness or numbness of the extremities. No aphasia or slurred speech. His off-balance symptoms have been static since January. Other ROS as per HPI.   Past History   Past Medical History:  Diagnosis Date   Allergy since birth   Arthritis    hands, shoulder, knees   Asthma    managed by [facility]   Blood transfusion without reported diagnosis during heart surgery 1974   Bronchitis, chronic obstructive, with exacerbation (HCC) 04/26/2014   Cataract    both eyes corrected by surgery   COPD (chronic obstructive pulmonary disease) (HCC)    GERD (gastroesophageal reflux disease)    controlled only with nexium priro prevacid failutre   Heart murmur Since birth   Hypercholesterolemia    Hyperlipidemia with target LDL less than 100 05/28/2011   Hypertension    Impotence due to erectile dysfunction 04/26/2014   Left  inguinal hernia 04/07/2016   Obstructive sleep apnea of adult 2012   on CPAP  tolerating ,  12 cm H20 , room air    Palpitations 11/27/2011   Sleep apnea CPAP since 2012   Tetralogy of Fallot    Thoracic aortic aneurysm [facility])    Treadmill stress test negative for angina pectoris 2005   Umbilical hernia without obstruction and without gangrene 03/10/2016   Past Surgical History:  Procedure Laterality Date   BRAIN SURGERY  before 5th birthday   CARDIAC CATHETERIZATION     CATARACT EXTRACTION W/PHACO Right 11/16/2014   Procedure: CATARACT EXTRACTION PHACO AND INTRAOCULAR LENS PLACEMENT (IOC);  Surgeon: Steven Dingeldein, MD;  Location: [facility] ORS;  Service: Ophthalmology;  Laterality: Right;  Onu#[id number] H US :01:03.9 AP%:24.3% CDE:30.12   CATARACT EXTRACTION W/PHACO Left 02/21/2017   Procedure: CATARACT EXTRACTION PHACO AND INTRAOCULAR LENS PLACEMENT (IOC) LEFT;  Surgeon: Mittie Gaskin, MD;  Location: [facility];  Service: Ophthalmology;  Laterality: Left;  requests later   COLONOSCOPY WITH PROPOFOL  N/A 06/17/2019   Procedure: COLONOSCOPY WITH PROPOFOL ;  Surgeon: Jinny Carmine, MD;  Location: [facility] END[facility]COPY;  Service: Endoscopy;  Laterality: N/A;   EYE SURGERY  cataract - 2016, 2019   HERNIA REPAIR  triple hernia - 2018   INGUINAL HERNIA REPAIR Left 04/20/2016   Procedure: HERNIA REPAIR INGUINAL ADULT WITH MESH;  Surgeon: Carlin Pastel, MD;  Location: [facility] ORS;  Service: General;  Laterality: Left;   JOINT REPLACEMENT  03/01/20, 02/14/21   left knee, right knee replacement   TETRALOGY OF FALLOT REPAIR  1974  [facility] [place]   TONSILLECTOMY     UMBILICAL HERNIA REPAIR N/A 04/20/2016   Procedure: HERNIA REPAIR UMBILICAL ADULT WITH MESH ;  Surgeon: Carlin Pastel, MD;  Location: [facility] ORS;  Service: General;  Laterality: N/A;    Family History: Family History  Problem Relation Age of Onset   Early death Mother    Heart disease Father 77       CAD   Arthritis Father     Cancer Brother 75       liver CA mets to brain    Cancer Brother        colon Ca , stomach CA   Cancer Brother    Cancer Brother     Social History  reports that he has never smoked. He has never used smokeless tobacco. He reports current alcohol use of about 18.0 standard drinks of alcohol per week. He reports that he does not use drugs.  Allergies  Allergen Reactions   Other Swelling, Anaphylaxis and Shortness Of Breath    Finfish - Throat Swells Fin Fish   Fish Allergy Swelling    Finfish - Throat Swells (PT HAS NO PROBLEMS WITH SHELLFISH)    Medications  No current facility-administered medications for this encounter.  Current Outpatient Medications:    albuterol  (PROVENTIL  HFA;VENTOLIN  HFA) 108 (90 BASE) MCG/ACT inhaler, Inhale 2 puffs into the lungs every 6 (six) hours as needed., Disp: , Rfl:    atorvastatin  (LIPITOR) 20 MG tablet, TAKE 1 TABLET BY MOUTH DAILY., Disp: 90 tablet, Rfl: 3   azelastine  (ASTELIN ) 0.1 % nasal spray, Place into the nose., Disp: , Rfl:    budesonide -formoterol (SYMBICORT) 160-4.5 MCG/ACT inhaler, Inhale 2 puffs into the lungs 2 (two) times daily., Disp: , Rfl:    celecoxib  (CELEBREX ) 200 MG capsule, TAKE 1 CAPSULE BY MOUTH TWICE DAILY AS NEEDED, Disp: 180 capsule, Rfl: 0   Cholecalciferol (VITAMIN D ) 2000 units CAPS, Take 2,000 Units by mouth every morning. , Disp: , Rfl:    diphenhydrAMINE  (BENADRYL ) 25 mg capsule, Take 25 mg by mouth at bedtime., Disp: , Rfl:    famotidine  (PEPCID ) 20 MG tablet, Take 1 tablet (20 mg total) by mouth 2 (two) times daily., Disp: 180 tablet, Rfl: 3   fluticasone  (FLONASE ) 50 MCG/ACT nasal spray, Place 2 sprays into both nostrils at bedtime. , Disp: , Rfl:    hydrochlorothiazide  (HYDRODIURIL ) 25 MG tablet, TAKE ONE TABLET BY MOUTH EVERY DAY, Disp: 90 tablet, Rfl: 3   metoprolol  succinate (TOPROL -XL) 50 MG 24 hr tablet, Take 50 mg by mouth daily., Disp: , Rfl:    montelukast  (SINGULAIR ) 10 MG tablet, Take 1 tablet  (10 mg total) by mouth at bedtime., Disp: 90 tablet, Rfl: 3   omeprazole (PRILOSEC) 40 MG capsule, Take by mouth., Disp: , Rfl:    Potassium 99 MG TABS, Take 2 tablets by mouth daily. (Patient taking differently: Take 1 tablet by mouth daily.), Disp: , Rfl:    predniSONE  (DELTASONE ) 5 MG tablet, Take 5 mg by mouth daily with breakfast., Disp: , Rfl:    tirzepatide  (MOUNJARO ) 10 MG/0.5ML Pen, Inject 10 mg into the skin once a week., Disp: 6 mL, Rfl: 2  Vitals   Vitals:   10/05/23 1353 10/05/23 1357  BP:  134/84  Pulse:  65  Resp:  16  Temp:  98.7 F (37.1 C)  TempSrc:  Oral  SpO2:  99%  Weight: 97.5 kg   Height: 6' (1.829 m)     Body mass  index is 29.16 kg/m.   Physical Exam   Constitutional: Appears well-developed and well-nourished.  Psych: Affect appropriate to situation.  Eyes: No scleral injection.  HENT: No OP obstruction.  Head: Normocephalic.  Respiratory: Effort normal, non-labored breathing.  Ext: Right big toe mild onychomycosis noted.   Neurologic Examination   Mental Status: Awake and alert. Fully oriented x 5. Thought content appropriate. Speech fluent without evidence of aphasia.  Able to follow all commands without difficulty. Cranial Nerves: II: Temporal visual fields intact with no extinction to DSS. PERRL. Mccann,IV, VI: No ptosis. EOMI. No nystagmus. V: Temp sensation equal bilaterally VII: Smile symmetric VIII: Hearing intact to voice IX,X: No hypophonia or hoarseness XI: Symmetric XII: Midline tongue extension Motor: RUE: 5/5 LUE: 5/5 RLE: 5/5 LLE: 5/5 Sensory: Temp and FT intact x 4.  Deep Tendon Reflexes: 2+ and symmetric bilateral biceps, brachioradialis and patellae Plantars: Right: downgoingLeft: downgoing Cerebellar: No ataxia with FNF bilaterally Gait: Deferred   Labs/Imaging/Neurodiagnostic studies   CBC:  Recent Labs  Lab 10/07/2023 1358  WBC 7.4  NEUTROABS 4.5  HGB 13.1  HCT 38.9*  MCV 92.6  PLT 235   Basic Metabolic  Panel:  Lab Results  Component Value Date   NA 140 Oct 07, 2023   K 3.5 10/07/23   CO2 28 10-07-23   GLUCOSE 118 (H) 07-Oct-2023   BUN 20 October 07, 2023   CREATININE 1.52 (H) October 07, 2023   CALCIUM  9.4 07-Oct-2023   GFRNONAA 48 (L) October 07, 2023   Lipid Panel:  Lab Results  Component Value Date   LDLCALC 75 08/21/2023   HgbA1c:  Lab Results  Component Value Date   HGBA1C 6.1 08/21/2023   Urine Drug Screen: No results found for: LABOPIA, COCAINSCRNUR, LABBENZ, AMPHETMU, THCU, LABBARB  Alcohol Level     Component Value Date/Time   [facility] <15 10/07/2023 1358   INR  Lab Results  Component Value Date   INR 1.0 October 07, 2023   APTT  Lab Results  Component Value Date   APTT 28 10/07/23   Recent TTE from April (performed at [facility]):   1. The left ventricular systolic function is normal, LVEF is visually  estimated at 60-65%.    2. There is mild aortic regurgitation.    3. There is mild aortic valve stenosis with a peak velocity of 2.8 m/s, mean  gradient of 16 mmHg, and aortic valve area of 1.6 cm2.    4. The aorta at the sinuses of Valsalva, sinotubular junction and ascending  aorta is severely dilated.    5. The right ventricle is mildly dilated in size, with normal systolic  function.   6. The right atrium is moderately dilated in size.    ASSESSMENT  73 y.o. male with a PMHx of asthma, arthritis, brain surgery in early childhood, chronic obstructive bronchitis, bilateral cataracts s/p surgical correction, COPD, GERD, Heart murmur, hypercholesterolemia, HLD, HTN, erectile dysfunction, left inguinal hernia, OSA on CPAP, palpitations, tetralogy of Fallot s/p surgical repair, thoracic aortic aneurysm and umbilical hernia who presented to the ED this afternoon after outpatient MRI had revealed subacute strokes. He initially had experienced blurry vision and unsteady gait starting about 6 months ago, then dizziness and nausea more recently. He was referred to a Neurologist who  ordered an MRI that was performed two weeks ago. He got a phone call that the MRI showed a stroke and that he needed to come to the ER.  - Exam is nonfocal.  - MRI brain: Punctate foci of restricted diffusion within the centrum  semiovale bilaterally involving the right posterior frontal lobe and left parietal lobe, compatible with acute lacunar infarcts. Mild-to-moderate periventricular and subcortical white matter disease. Cerebral volumes within normal limits on NeuroQuant imaging. - EKG: Suspect arm lead reversal, interpretation assumes no reversal; Sinus rhythm with occasional Premature ventricular complexes and Premature atrial complexes; Right bundle branch block; Lateral infarct, age undetermined; When compared with ECG of 15-Mar-2016 15:19, Premature ventricular complexes are now Present, Premature atrial complexes are now Present, QRS axis Shifted left, Lateral infarct is now Present - Labs: Elevated Cr with eGFR 48. WBC normal. Glucose 118. EtOH level < 15.  - Impression: Acute punctate deep white matter ischemic infarctions. DDx for underlying etiology includes cardioembolic and chronic hypertensive microangiopathy.   RECOMMENDATIONS  - CIWA protocol given drinking habit of approximately 18 drinks per week.  - Continue his home atorvastatin .  - Cardiology consult given findings on TTE performed at [facility] last April, history of tetralogy of Fallot, as well as EKG findings.  - CTA of head and neck - May need a TEE. Will defer to Cardiology.  - Cardiac telemetry - Start ASA - BP management per standard protocol. Out of the permissive HTN time window.  - Encourage PO hydration.  - HgbA1c, fasting lipid panel - PT consult, OT consult, Speech consult - Risk factor modification ______________________________________________________________________    Bonney SHARK, Miesha Bachmann, MD [facility]

## 2023-10-05 NOTE — ED Provider Notes (Signed)
 [facility] Provider Note    Event Date/Time   First MD Initiated Contact with Patient 10/05/23 1406     (approximate)  History   Chief Complaint: Cerebrovascular Accident  HPI  Charles Mccann is a 73 y.o. male with a past medical history of COPD, gastric reflux, hypertension, hyperlipidemia, presents to the emergency department for an abnormal MRI.  According to the patient since January he has been experiencing episodes of dizziness and feeling off balance.  Patient states he has seen his doctor for the same he was referred to ENT for a vertigo workup that he says ultimately did not show any significant finding.  Patient was then scheduled a neurology appointment which took approximately 6 months.  Patient had his neurology appointment and an outpatient MRI was ordered and performed on 8/18.  Patient received a phone call today saying that his MRI showed a stroke and he should go to the emergency department for further workup and evaluation.  Patient denies any weakness numbness of any arm or leg any confusion or slurred speech.  Patient states his off-balance symptoms have not really worsened but have not improved since January.  Physical Exam   Triage Vital Signs: ED Triage Vitals  Encounter Vitals Group     BP 10/05/23 1357 134/84     Girls Systolic BP Percentile --      Girls Diastolic BP Percentile --      Boys Systolic BP Percentile --      Boys Diastolic BP Percentile --      Pulse Rate 10/05/23 1357 65     Resp 10/05/23 1357 16     Temp 10/05/23 1357 98.7 F (37.1 C)     Temp Source 10/05/23 1357 Oral     SpO2 10/05/23 1357 99 %     Weight 10/05/23 1353 215 lb (97.5 kg)     Height 10/05/23 1353 6' (1.829 m)     Head Circumference --      Peak Flow --      Pain Score 10/05/23 1353 0     Pain Loc --      Pain Education --      Exclude from Growth Chart --     Most recent vital signs: Vitals:   10/05/23 1357  BP: 134/84  Pulse: 65   Resp: 16  Temp: 98.7 F (37.1 C)  SpO2: 99%    General: Awake, no distress.  CV:  Good peripheral perfusion.  Regular rate and rhythm  Resp:  Normal effort.  Equal breath sounds bilaterally.  Abd:  No distention.  Soft, nontender.  No rebound or guarding. Other:  Equal grip strength bilaterally.  No pronator drift.  Cranial nerves intact.  5/5 motor in all extremities.   ED Results / Procedures / Treatments   EKG  EKG viewed and interpreted by myself shows what appears to be a sinus rhythm at 71 bpm with a slightly widened QRS, normal axis, slight QTc prolongation otherwise normal intervals with nonspecific ST changes.  RSR prime consistent with likely right bundle branch block.  No ST elevation.  Occasional PVC.  MEDICATIONS ORDERED IN ED: Medications - No data to display   IMPRESSION / MDM / ASSESSMENT AND PLAN / ED COURSE  I reviewed the triage vital signs and the nursing notes.  Patient's presentation is most consistent with acute presentation with potential threat to life or bodily function.  Patient presents to the emergency department for an abnormal  MRI.  Patient has been experiencing ataxia symptoms since January of this year.  I reviewed the patient's MRI which shows punctate foci of restricted diffusion including the right posterior frontal lobe and left parietal lobe compatible with acute lacunar infarcts.  Mild to moderate periventricular white matter disease.  Patient's lab work in the emergency department shows reassuring CBC, reassuring chemistry.  EKG shows a normal sinus rhythm.  Given the patient's symptoms have been ongoing since January we will discuss with neurology as I believe the patient's workup could be completed as an outpatient however given the acute nature of the lacunar infarcts will discuss with neurology first.  The remainder of the patient's workup in the emergency department including physical exam vitals and lab work shows no significant  finding.  I spoke to Dr. Merrianne of neurology who has reviewed the MRI.  MRI appears to show 2 acute infarcts.  Concern for ongoing infarcts or embolic showering.  We will admit to the hospitalist for further stroke workup.  FINAL CLINICAL IMPRESSION(S) / ED DIAGNOSES   Lacunar infarct   Note:  This document was prepared using Dragon voice recognition software and may include unintentional dictation errors.   Dorothyann Drivers, MD 10/05/23 1520

## 2023-10-06 ENCOUNTER — Inpatient Hospital Stay: Admit: 2023-10-06 | Discharge: 2023-10-06 | Disposition: A | Discharge status: Home / Self Care | Attending: Hospitalist

## 2023-10-06 ENCOUNTER — Observation Stay

## 2023-10-06 DIAGNOSIS — I639 Cerebral infarction, unspecified: Secondary | ICD-10-CM | POA: Diagnosis not present

## 2023-10-06 DIAGNOSIS — R29818 Other symptoms and signs involving the nervous system: Secondary | ICD-10-CM | POA: Diagnosis not present

## 2023-10-06 DIAGNOSIS — I6389 Other cerebral infarction: Secondary | ICD-10-CM | POA: Diagnosis not present

## 2023-10-06 LAB — ECHOCARDIOGRAM COMPLETE
AR max vel: 1.09 cm2
AV Area VTI: 1 cm2
AV Area mean vel: 1.1 cm2
AV Mean grad: 22.5 mmHg
AV Peak grad: 45.6 mmHg
Ao pk vel: 3.38 m/s
Area-P 1/2: 3.31 cm2
Calc EF: 46.2 %
Height: 72 in
MV VTI: 2.5 cm2
P 1/2 time: 451 ms
S' Lateral: 4.2 cm
Single Plane A2C EF: 52 %
Single Plane A4C EF: 43.2 %
Weight: 3440 [oz_av]

## 2023-10-06 LAB — COMPREHENSIVE METABOLIC PANEL WITH GFR
ALT: 14 U/L (ref 0–44)
AST: 24 U/L (ref 15–41)
Albumin: 3.1 g/dL — ABNORMAL LOW (ref 3.5–5.0)
Alkaline Phosphatase: 50 U/L (ref 38–126)
Anion gap: 11 (ref 5–15)
BUN: 24 mg/dL — ABNORMAL HIGH (ref 8–23)
CO2: 26 mmol/L (ref 22–32)
Calcium: 8.9 mg/dL (ref 8.9–10.3)
Chloride: 101 mmol/L (ref 98–111)
Creatinine, Ser: 1.42 mg/dL — ABNORMAL HIGH (ref 0.61–1.24)
GFR, Estimated: 53 mL/min — ABNORMAL LOW (ref >60)
Glucose, Bld: 172 mg/dL — ABNORMAL HIGH (ref 70–99)
Potassium: 4.1 mmol/L (ref 3.5–5.1)
Sodium: 138 mmol/L (ref 135–145)
Total Bilirubin: 1.1 mg/dL (ref 0.0–1.2)
Total Protein: 6.2 g/dL — ABNORMAL LOW (ref 6.5–8.1)

## 2023-10-06 LAB — LIPID PANEL
Cholesterol: 116 mg/dL (ref 0–200)
HDL: 34 mg/dL — ABNORMAL LOW (ref >40)
LDL Cholesterol: 70 mg/dL (ref 0–99)
Total CHOL/HDL Ratio: 3.4 ratio
Triglycerides: 60 mg/dL (ref <150)
VLDL: 12 mg/dL (ref 0–40)

## 2023-10-06 LAB — MAGNESIUM: Magnesium: 1.7 mg/dL (ref 1.7–2.4)

## 2023-10-06 MED ORDER — ADULT MULTIVITAMIN W/MINERALS CH
1.0000 | ORAL_TABLET | Freq: Every day | ORAL | Status: DC
  Administered 2023-10-06: 1 via ORAL
  Filled 2023-10-06: qty 1

## 2023-10-06 MED ORDER — THIAMINE MONONITRATE 100 MG PO TABS
100.0000 mg | ORAL_TABLET | Freq: Every day | ORAL | Status: DC
Start: 2023-10-06 — End: 2023-10-06
  Administered 2023-10-06: 100 mg via ORAL
  Filled 2023-10-06: qty 1

## 2023-10-06 MED ORDER — THIAMINE HCL 100 MG/ML IJ SOLN
100.0000 mg | Freq: Every day | INTRAMUSCULAR | Status: DC
  Filled 2023-10-06: qty 2

## 2023-10-06 MED ORDER — ATORVASTATIN CALCIUM 80 MG PO TABS
80.0000 mg | ORAL_TABLET | Freq: Every day | ORAL | 1 refills | Status: DC
Start: 2023-10-07 — End: 2023-10-16

## 2023-10-06 MED ORDER — ASPIRIN 81 MG PO TBEC: 81.0000 mg | DELAYED_RELEASE_TABLET | Freq: Every day | ORAL | 12 refills | Status: AC

## 2023-10-06 MED ORDER — LORAZEPAM 2 MG PO TABS: 0.0000 mg | ORAL_TABLET | Freq: Two times a day (BID) | ORAL | Status: DC

## 2023-10-06 MED ORDER — ADULT MULTIVITAMIN W/MINERALS CH
1.0000 | ORAL_TABLET | Freq: Every day | ORAL | 0 refills | Status: AC
Start: 2023-10-07 — End: 2023-11-06

## 2023-10-06 MED ORDER — VITAMIN B-1 100 MG PO TABS: 100.0000 mg | ORAL_TABLET | Freq: Every day | ORAL | 0 refills | Status: AC

## 2023-10-06 MED ORDER — LORAZEPAM 1 MG PO TABS: 1.0000 mg | ORAL_TABLET | ORAL | Status: DC | PRN

## 2023-10-06 MED ORDER — IOHEXOL 350 MG/ML SOLN
75.0000 mL | Freq: Once | INTRAVENOUS | Status: AC | PRN
  Administered 2023-10-06: 75 mL via INTRAVENOUS

## 2023-10-06 MED ORDER — FOLIC ACID 1 MG PO TABS: 1.0000 mg | ORAL_TABLET | Freq: Every day | ORAL | 0 refills | Status: AC

## 2023-10-06 MED ORDER — LORAZEPAM 2 MG/ML IJ SOLN: 1.0000 mg | INTRAMUSCULAR | Status: DC | PRN

## 2023-10-06 MED ORDER — FOLIC ACID 1 MG PO TABS
1.0000 mg | ORAL_TABLET | Freq: Every day | ORAL | Status: DC
  Administered 2023-10-06: 1 mg via ORAL
  Filled 2023-10-06: qty 1

## 2023-10-06 MED ORDER — LORAZEPAM 2 MG PO TABS
0.0000 mg | ORAL_TABLET | Freq: Four times a day (QID) | ORAL | Status: DC
  Administered 2023-10-06: 2 mg via ORAL
  Filled 2023-10-06: qty 1

## 2023-10-06 NOTE — Evaluation (Signed)
 Physical Therapy Evaluation Patient Details Name: Charles Mccann MRN: [id number] DOB: 1951-01-08 Today's Date: 10/06/2023  History of Present Illness  Charles Mccann is a pleasant 73 y.o. male with medical history significant for COPD, GERD, HTN, HLD, recurrent dizziness/balance issues (since January) with negative workup who was brought into ED after he was sent by his neurologist for abnormal MRI. Outpatient imaging significant for punctate foci of restricted diffusion including the right posterior frontal lobe and left parietal lobe compatible with acute lacunar infarcts.   Advised by neurologist to present to ED; admitted for TIA/CVA work-up.  Clinical Impression  Patient seated in recliner upon arrival to room; alert and oriented, follows commands and agreeable to participation with treatment session.  Denies pain; voices eagerness for discharge home when medically cleared. Bilat UE/LE strength and ROM grossly symmetrical and WFL; no focal weakness, sensory or coordination deficits appreciated.  Able to complete sit/stand, basic transfers and gait (400') without assist device, indep.  Demonstrates reciprocal stepping pattern with good step height/length; fair/good cadence, overall gait speed and performance. Completes head turns, changes of direction, obstacle negotiation, speed modulation and start/stop without buckling, LOB or safety concern  Mild higher level balance deficits (BERG 49/56) noted with tandem stance, SLS; however, patient with good awareness of higher level balance deficits (patient feels these were baseline for him, unrelated to acute presentation), good use of safety strategies/compensatory strategies as needed.  Discussed opportunity for OPPT at discharge to address higher level balance deficits; patient declined. Overall, appears to be generally at functional baseline without significant change in baseline status/ability.  Patient in agreement.  Will complete initial PT  orders; please reconsult should needs change.      If plan is discharge home, recommend the following:     Can travel by private vehicle        Equipment Recommendations    Recommendations for Other Services       Functional Status Assessment Patient has not had a recent decline in their functional status     Precautions / Restrictions Precautions Precautions: None Restrictions Weight Bearing Restrictions Per Provider Order: No      Mobility  Bed Mobility               General bed mobility comments: seated in recliner beginning/end of treatment session    Transfers Overall transfer level: Independent Equipment used: None                    Ambulation/Gait Ambulation/Gait assistance: Independent Gait Distance (Feet): 400 Feet Assistive device: None         General Gait Details: reciprocal stepping pattern with good step height/length; fair/good cadence, overall gait speed and performance.  Completes head turns, changes of direction, obstacle negotiation, speed modulation and start/stop without buckling, LOB or safety concern  Stairs            Wheelchair Mobility     Tilt Bed    Modified Rankin (Stroke Patients Only)       Balance Overall balance assessment: Independent                               Standardized Balance Assessment Standardized Balance Assessment : Berg Balance Test Berg Balance Test Sit to Stand: Able to stand without using hands and stabilize independently Standing Unsupported: Able to stand safely 2 minutes Sitting with Back Unsupported but Feet Supported on Floor or Stool: Able  to sit safely and securely 2 minutes Stand to Sit: Sits safely with minimal use of hands Transfers: Able to transfer safely, minor use of hands Standing Unsupported with Eyes Closed: Able to stand 10 seconds safely Standing Ubsupported with Feet Together: Able to place feet together independently and stand 1 minute  safely From Standing, Reach Forward with Outstretched Arm: Can reach confidently >25 cm (10) From Standing Position, Pick up Object from Floor: Able to pick up shoe safely and easily From Standing Position, Turn to Look Behind Over each Shoulder: Looks behind from both sides and weight shifts well Turn 360 Degrees: Able to turn 360 degrees safely in 4 seconds or less Standing Unsupported, Alternately Place Feet on Step/Stool: Able to complete 4 steps without aid or supervision Standing Unsupported, One Foot in Front: Able to take small step independently and hold 30 seconds Standing on One Leg: Tries to lift leg/unable to hold 3 seconds but remains standing independently Total Score: 49         Pertinent Vitals/Pain Pain Assessment Pain Assessment: No/denies pain    Home Living Family/patient expects to be discharged to:: Private residence Living Arrangements: Spouse/significant other Available Help at Discharge: Family;Available 24 hours/day Type of Home: House Home Access: Level entry     Alternate Level Stairs-Number of Steps: 12 Home Layout: Two level;Able to live on main level with bedroom/bathroom Home Equipment: Rolling Walker (2 wheels)      Prior Function Prior Level of Function : Independent/Modified Independent;Driving             Mobility Comments: Indep with ADLs, household and community mobilization without assist device; denies fall history.  Enjoys golfing (3x/week) ADLs Comments: Indep     Extremity/Trunk Assessment   Upper Extremity Assessment Upper Extremity Assessment: Overall WFL for tasks assessed    Lower Extremity Assessment Lower Extremity Assessment: Overall WFL for tasks assessed (grossly 5/5 throughout; no focal weakness, sensory or coordianation deficits appreciated)    Cervical / Trunk Assessment Cervical / Trunk Assessment: Normal  Communication   Communication Communication: No apparent difficulties    Cognition Arousal:  Alert Behavior During Therapy: WFL for tasks assessed/performed   PT - Cognitive impairments: No apparent impairments                                 Cueing       General Comments General comments (skin integrity, edema, etc.): Good awareness of higher level balance deficits (patient feels these were baseline for him, unrelated to acute presentation), good use of safety strategies/compensatory strategies as needed    Exercises Other Exercises Other Exercises: Modified DGI, 12/12; no significant gait deviation, balance loss with dynamic components   Assessment/Plan    PT Assessment Patient does not need any further PT services (discussed potential OPPT for higher-level balance; patient declines)  PT Problem List         PT Treatment Interventions      PT Goals (Current goals can be found in the Care Plan section)  Acute Rehab PT Goals Patient Stated Goal: to go home PT Goal Formulation: All assessment and education complete, DC therapy Time For Goal Achievement: 10/06/23 Potential to Achieve Goals: Good    Frequency       Co-evaluation               AM-PAC PT 6 Clicks Mobility  Outcome Measure Help needed turning from your back to your side  while in a flat bed without using bedrails?: None Help needed moving from lying on your back to sitting on the side of a flat bed without using bedrails?: None Help needed moving to and from a bed to a chair (including a wheelchair)?: None Help needed standing up from a chair using your arms (e.g., wheelchair or bedside chair)?: None Help needed to walk in hospital room?: None Help needed climbing 3-5 steps with a railing? : None 6 Click Score: 24    End of Session   Activity Tolerance: Patient tolerated treatment well Patient left: in chair;with call bell/phone within reach   PT Visit Diagnosis: Other symptoms and signs involving the nervous system (R29.898);Difficulty in walking, not elsewhere classified  (R26.2)    Time: 9065-9052 PT Time Calculation (min) (ACUTE ONLY): 13 min   Charges:   PT Evaluation $PT Eval Low Complexity: 1 Low   PT General Charges $$ ACUTE PT VISIT: 1 Visit         Kamorie Aldous H. Delores, PT, DPT, NCS 10/06/23, 10:01 AM [phone]

## 2023-10-06 NOTE — Discharge Summary (Signed)
 Physician Discharge Summary   Patient: Charles Mccann MRN: [id number] DOB: 09-25-1950  Admit date:     10/05/2023  Discharge date: 10/06/23  Discharge Physician: Keison Glendinning   PCP: [name], MD   Recommendations at discharge:    Follow up with neurology and Cardiology as an outpatient  Discharge Diagnoses: Principal Problem:   Acute stroke due to ischemia [facility]) Active Problems:   Hyperlipidemia with target LDL less than 100   Hypertension   Obesity, diabetes, and hypertension syndrome (HCC)  Resolved Problems:   * No resolved hospital problems. *  Hospital Course:  Charles Mccann is a pleasant 73 y.o. male with medical history significant for COPD, GERD, HTN, HLD, recurrent dizziness/balance issues with negative workup who was brought into ED after he was sent by his neurologist for abnormal MRI.  According to the patient since January he has been experiencing episodes of dizziness and feeling off balance.  Patient states that he has seen his doctor for the same and was referred to ENT for vertigo workup that he says ultimately did not show any significant finding.  Patient was then scheduled a neurology appointment which took approximately 50-month.  Patient had a neurology appointment and outpatient MRI was ordered and performed on 09/24/2023.  Patient received a phone call today saying that his MRI showed a stroke and should go to the ED for evaluation and further workup.  Patient denies any weakness, numbness or speech slurring today.  He states that he still has some off balance symptoms that has not really worsened or improved since January.  He denies any fever, chills, nausea, vomiting, slurring of speech, weakness of upper and lower extremities.   ED Course: Upon arrival to the ED, patient is found to be at his baseline.  There was no weakness on the exam.  MRI which was done outpatient showed punctate foci of restricted diffusion within the centrum ovale bilaterally  involving the right posterior frontal lobe and left parietal lobe compatible with acute lacunar infarct.  Neurologist was called for evaluation.  Hospitalist was called for evaluation for admission.    Assessment and Plan:    Acute ischemic stroke Patient with complaints of dizziness and feeling off balance for several months Seen by neurology and had an MRI done as an outpatient which showed punctate foci of restricted diffusion within the centrum semiovale bilaterally involving the right posterior frontal lobe and left parietal lobe, compatible with acute lacunar infarcts. Mild-to-moderate periventricular and subcortical white matter disease. Cerebral volumes within normal limits on NeuroQuant imaging. Seen in consultation by neurology Neurologist believes this may be related to embolic event, however, patient stated that he had been evaluated for those even including TEE by his providers and which was negative. Had a CT angiogram of the head and neck which was negative for large vessel occlusion 2D echocardiogram showed a normal LVEF of 50 to 55% with no regional wall motion abnormalities.  Mild LVH.  No cardiac thrombus Patient to follow-up with his cardiologist for an event monitor and possible TEE Will discharge on aspirin  on atorvastatin     2.  HTN Continue hydrochlorothiazide  and metoprolol    3.  COPD - Stable and not acutely exacerbated   4.  GERD - Continue PPI          Consultants: Neurology Procedures performed: CT angiogram of the head and neck, 2D echocardiogram Disposition: Home Diet recommendation:  Discharge Diet Orders (From admission, onward)     Start  Ordered   10/06/23 0000  Diet - low sodium heart healthy        10/06/23 1317           Cardiac diet DISCHARGE MEDICATION: Allergies as of 10/06/2023       Reactions   Other Swelling, Anaphylaxis, Shortness Of Breath   [place] - Throat Swells Fin Fish   Fish Allergy Swelling   [place] -  Throat Swells (PT HAS NO PROBLEMS WITH SHELLFISH)        Medication List     STOP taking these medications    celecoxib  200 MG capsule Commonly known as: CELEBREX    omeprazole 40 MG capsule Commonly known as: PRILOSEC       TAKE these medications    albuterol  108 (90 Base) MCG/ACT inhaler Commonly known as: VENTOLIN  HFA Inhale 2 puffs into the lungs every 6 (six) hours as needed.   aspirin  EC 81 MG tablet Take 1 tablet (81 mg total) by mouth daily. Swallow whole. Start taking on: October 07, 2023   atorvastatin  80 MG tablet Commonly known as: LIPITOR Take 1 tablet (80 mg total) by mouth daily. Start taking on: October 07, 2023 What changed:  medication strength how much to take   azelastine  0.1 % nasal spray Commonly known as: ASTELIN  Place 1 spray into the nose daily.   budesonide -formoterol 160-4.5 MCG/ACT inhaler Commonly known as: SYMBICORT Inhale 2 puffs into the lungs 2 (two) times daily.   cetirizine 10 MG tablet Commonly known as: ZYRTEC Take 10 mg by mouth daily.   cyanocobalamin 1000 MCG/ML injection Commonly known as: VITAMIN B12 Inject 1,000 mcg into the muscle once a week. once a week for 4 weeks then once a month for 4 months   diphenhydrAMINE  25 mg capsule Commonly known as: BENADRYL  Take 25 mg by mouth at bedtime.   famotidine  20 MG tablet Commonly known as: PEPCID  Take 1 tablet (20 mg total) by mouth 2 (two) times daily.   fluticasone  50 MCG/ACT nasal spray Commonly known as: FLONASE  Place 2 sprays into both nostrils at bedtime.   folic acid  1 MG tablet Commonly known as: FOLVITE  Take 1 tablet (1 mg total) by mouth daily. Start taking on: October 07, 2023   hydrochlorothiazide  25 MG tablet Commonly known as: HYDRODIURIL  TAKE ONE TABLET BY MOUTH EVERY DAY   levalbuterol  0.63 MG/3ML nebulizer solution Commonly known as: XOPENEX  Take 0.63 mg by nebulization every 8 (eight) hours as needed for wheezing.   metoprolol  succinate 50  MG 24 hr tablet Commonly known as: TOPROL -XL Take 50 mg by mouth daily.   montelukast  10 MG tablet Commonly known as: SINGULAIR  Take 1 tablet (10 mg total) by mouth at bedtime.   multivitamin with minerals Tabs tablet Take 1 tablet by mouth daily. Start taking on: October 07, 2023   Potassium 99 MG Tabs Take 2 tablets by mouth daily. What changed: how much to take   predniSONE  5 MG tablet Commonly known as: DELTASONE  Take 5 mg by mouth every evening.   thiamine  100 MG tablet Commonly known as: Vitamin B-1 Take 1 tablet (100 mg total) by mouth daily. Start taking on: October 07, 2023   tirzepatide  10 MG/0.5ML Pen Commonly known as: MOUNJARO  Inject 10 mg into the skin once a week.   Vitamin D  50 MCG (2000 UT) Caps Take 2,000 Units by mouth every morning.        Discharge Exam: Filed Weights   10/05/23 1353  Weight: 97.5 kg   Constitutional:  Alert, awake, calm, comfortable HEENT: Neck supple Respiratory: Clear to auscultation B/L, no wheezing, no rales.  Cardiovascular: Regular rate and rhythm, no murmurs / rubs / gallops. No extremity edema. 2+ pedal pulses. No carotid bruits.  Abdomen: Soft, no tenderness, Bowel sounds positive.  Musculoskeletal: no clubbing / cyanosis. Good ROM, no contractures. Normal muscle tone.  Skin: no rashes, lesions, ulcers. Neurologic: CN 2-12 grossly intact. Sensation intact, No focal deficit identified Psychiatric: Alert and oriented x 3. Normal mood.      Condition at discharge: stable  The results of significant diagnostics from this hospitalization (including imaging, microbiology, ancillary and laboratory) are listed below for reference.   Imaging Studies: ECHOCARDIOGRAM COMPLETE Result Date: 10/06/2023    ECHOCARDIOGRAM REPORT   Patient Name:   Charles Mccann Date of Exam: 10/06/2023 Medical Rec #:  [id number]         Height:       72.0 in Accession #:    [id number]        Weight:       215.0 lb Date of Birth:  1950/03/11           BSA:          2.197 m Patient Age:    73 years          BP:           119/74 mmHg Patient Gender: M                 HR:           65 bpm. Exam Location:  [facility] Procedure: 2D Echo, Strain Analysis, Color Doppler and Cardiac Doppler (Both            Spectral and Color Flow Doppler were utilized during procedure). Indications:     Stroke I63.9  History:         Patient has no prior history of Echocardiogram examinations.                  Signs/Symptoms:Murmur; Risk Factors:Hypertension.  Sonographer:     Bari Roar Referring Phys:  [id number] NENA PAUDEL Diagnosing Phys: Shelda Bruckner MD  Sonographer Comments: Global longitudinal strain was attempted. IMPRESSIONS  1. Left ventricular ejection fraction, by estimation, is 50 to 55%. The left ventricle has low normal function. The left ventricle has no regional wall motion abnormalities. There is mild left ventricular hypertrophy. Left ventricular diastolic parameters are indeterminate.  2. Right ventricular systolic function is normal. The right ventricular size is normal. There is normal pulmonary artery systolic pressure.  3. Left atrial size was moderately dilated.  4. The mitral valve is normal in structure. Trivial mitral valve regurgitation. No evidence of mitral stenosis.  5. The aortic valve is calcified. There is moderate calcification of the aortic valve. There is moderate thickening of the aortic valve. Aortic valve regurgitation is mild. Mild aortic valve stenosis. Aortic valve mean gradient measures 22.5 mmHg. Aortic valve Vmax measures 3.38 m/s.  6. Aortic dilatation noted. There is dilatation of the aortic root, measuring 44 mm. There is dilatation of the ascending aorta, measuring 47 mm.  7. The inferior vena cava is normal in size with greater than 50% respiratory variability, suggesting right atrial pressure of 3 mmHg. Comparison(s): Prior images unable to be directly viewed, comparison made by report only. Report from 05/17/23 from [facility]: EF  60-65%, mild AR, mild AS, severely dilated aortic root and ascending aorta. Conclusion(s)/Recommendation(s): No intracardiac source of embolism detected on  this transthoracic study. Consider a transesophageal echocardiogram to exclude cardiac source of embolism if clinically indicated. EF low normal without focal wall motion abnormalities. Dilated aortic root and ascending aorta. FINDINGS  Left Ventricle: Left ventricular ejection fraction, by estimation, is 50 to 55%. The left ventricle has low normal function. The left ventricle has no regional wall motion abnormalities. Global longitudinal strain performed but not reported based on interpreter judgement due to suboptimal tracking. The left ventricular internal cavity size was normal in size. There is mild left ventricular hypertrophy. Left ventricular diastolic parameters are indeterminate. Right Ventricle: The right ventricular size is normal. Right vetricular wall thickness was not well visualized. Right ventricular systolic function is normal. There is normal pulmonary artery systolic pressure. The tricuspid regurgitant velocity is 2.71 m/s, and with an assumed right atrial pressure of 3 mmHg, the estimated right ventricular systolic pressure is 32.4 mmHg. Left Atrium: Left atrial size was moderately dilated. Right Atrium: Right atrial size was normal in size. Pericardium: There is no evidence of pericardial effusion. Mitral Valve: The mitral valve is normal in structure. Mild mitral annular calcification. Trivial mitral valve regurgitation. No evidence of mitral valve stenosis. MV peak gradient, 5.5 mmHg. The mean mitral valve gradient is 2.0 mmHg. Tricuspid Valve: The tricuspid valve is normal in structure. Tricuspid valve regurgitation is mild . No evidence of tricuspid stenosis. Aortic Valve: The aortic valve is calcified. There is moderate calcification of the aortic valve. There is moderate thickening of the aortic valve. Aortic valve regurgitation is  mild. Aortic regurgitation PHT measures 451 msec. Mild aortic stenosis is present. Aortic valve mean gradient measures 22.5 mmHg. Aortic valve peak gradient measures 45.6 mmHg. Aortic valve area, by VTI measures 1.00 cm. Pulmonic Valve: The pulmonic valve was not well visualized. Pulmonic valve regurgitation is trivial. No evidence of pulmonic stenosis. Aorta: Aortic dilatation noted. There is dilatation of the aortic root, measuring 44 mm. There is dilatation of the ascending aorta, measuring 47 mm. Venous: The inferior vena cava is normal in size with greater than 50% respiratory variability, suggesting right atrial pressure of 3 mmHg. IAS/Shunts: The atrial septum is grossly normal.  LEFT VENTRICLE PLAX 2D LVIDd:         5.50 cm      Diastology LVIDs:         4.20 cm      LV e' medial:    7.62 cm/s LV PW:         1.20 cm      LV E/e' medial:  7.2 LV IVS:        1.40 cm      LV e' lateral:   9.57 cm/s LVOT diam:     2.20 cm      LV E/e' lateral: 5.7 LV SV:         81 LV SV Index:   37 LVOT Area:     3.80 cm  LV Volumes (MOD) LV vol d, MOD A2C: 152.0 ml LV vol d, MOD A4C: 114.0 ml LV vol s, MOD A2C: 72.9 ml LV vol s, MOD A4C: 64.8 ml LV SV MOD A2C:     79.1 ml LV SV MOD A4C:     114.0 ml LV SV MOD BP:      61.8 ml RIGHT VENTRICLE RV Basal diam:  3.60 cm RV Mid diam:    3.30 cm RV S prime:     8.59 cm/s TAPSE (M-mode): 2.2 cm LEFT ATRIUM  Index        RIGHT ATRIUM           Index LA diam:        4.20 cm 1.91 cm/m   RA Area:     17.00 cm LA Vol (A2C):   82.5 ml 37.55 ml/m  RA Volume:   42.00 ml  19.12 ml/m LA Vol (A4C):   69.1 ml 31.45 ml/m LA Biplane Vol: 77.0 ml 35.05 ml/m  AORTIC VALVE                     PULMONIC VALVE AV Area (Vmax):    1.09 cm      PV Vmax:          1.27 m/s AV Area (Vmean):   1.10 cm      PV Peak grad:     6.5 mmHg AV Area (VTI):     1.00 cm      PR End Diast Vel: 6.97 msec AV Vmax:           337.50 cm/s   RVOT Peak grad:   3 mmHg AV Vmean:          214.500 cm/s AV VTI:             0.810 m AV Peak Grad:      45.6 mmHg AV Mean Grad:      22.5 mmHg LVOT Vmax:         96.60 cm/s LVOT Vmean:        62.300 cm/s LVOT VTI:          0.212 m LVOT/AV VTI ratio: 0.26 AI PHT:            451 msec  AORTA Ao Root diam: 4.40 cm Ao Asc diam:  4.70 cm MITRAL VALVE               TRICUSPID VALVE MV Area (PHT): 3.31 cm    TR Peak grad:   29.4 mmHg MV Area VTI:   2.50 cm    TR Vmax:        271.00 cm/s MV Peak grad:  5.5 mmHg MV Mean grad:  2.0 mmHg    SHUNTS MV Vmax:       1.17 m/s    Systemic VTI:  0.21 m MV Vmean:      71.4 cm/s   Systemic Diam: 2.20 cm MV Decel Time: 229 msec MV E velocity: 54.50 cm/s MV A velocity: 96.90 cm/s MV E/A ratio:  0.56 MV A Prime:    12.9 cm/s Shelda Bruckner MD Electronically signed by Shelda Bruckner MD Signature Date/Time: 10/06/2023/12:16:06 PM    Final    MR BRAIN WO CONTRAST Result Date: 10/05/2023 EXAM: MRI BRAIN WITHOUT CONTRAST 09/24/2023 01:29:05 PM TECHNIQUE: Multiplanar multisequence MRI of the head/brain was performed without the administration of intravenous contrast. COMPARISON: None available. CLINICAL HISTORY: MCI (mild cognitive impairment) FINDINGS: BRAIN AND VENTRICLES: There is mild-to-moderate periventricular and subcortical white matter disease. There are also punctate foci of restricted diffusion present within the centrum semiovale bilaterally involving the right posterior frontal lobe and left parietal lobe which are evident on images 38 and 36 of series 5. No acute infarct. No intracranial hemorrhage. No mass. No midline shift. No hydrocephalus. Cerebral volumes appear to be within normal limits. ORBITS: The patient is status post bilateral lens replacement. No acute abnormality. SINUSES AND MASTOIDS: No acute abnormality. BONES AND SOFT TISSUES: Normal marrow signal. No acute soft tissue abnormality.  IMPRESSION: 1. Punctate foci of restricted diffusion within the centrum semiovale bilaterally involving the right posterior frontal  lobe and left parietal lobe, compatible with acute lacunar infarcts. 2. Mild-to-moderate periventricular and subcortical white matter disease. 3. Cerebral volumes within normal limits on NeuroQuant imaging. Electronically signed by: Evalene Coho MD 10/05/2023 12:22 PM EDT RP Workstation: [id number]    Microbiology: Results for orders placed or performed in visit on 02/14/23  Urine Culture     Status: None   Collection Time: 02/14/23 11:09 AM   Specimen: Urine  Result Value Ref Range Status   MICRO NUMBER: [id number]  Final   SPECIMEN QUALITY: Adequate  Final   Sample Source URINE  Final   STATUS: FINAL  Final   Result: No Growth  Final    Labs: CBC: Recent Labs  Lab 10/05/23 1358  WBC 7.4  NEUTROABS 4.5  HGB 13.1  HCT 38.9*  MCV 92.6  PLT 235   Basic Metabolic Panel: Recent Labs  Lab 10/05/23 1358 10/06/23 0545  NA 140 138  K 3.5 4.1  CL 100 101  CO2 28 26  GLUCOSE 118* 172*  BUN 20 24*  CREATININE 1.52* 1.42*  CALCIUM  9.4 8.9  MG  --  1.7   Liver Function Tests: Recent Labs  Lab 10/05/23 1358 10/06/23 0545  AST 18 24  ALT 15 14  ALKPHOS 55 50  BILITOT 0.8 1.1  PROT 6.5 6.2*  ALBUMIN 3.5 3.1*   CBG: No results for input(s): GLUCAP in the last 168 hours.  Discharge time spent: greater than 30 minutes.  Signed: Aimee Somerset, MD [facility] 10/06/2023

## 2023-10-06 NOTE — Progress Notes (Signed)
 Asked for possible TEE.  Informed primary service this would be unable to be accommodated until Tuesday, 9/2 secondary to the holiday on Monday, 9/1.  Informed patient wanted to be discharged today.  Advised to primary service that if they are discharged the patient will need to follow-up with primary cardiology team for discussion of possible TEE.  If primary service feels TEE is indicated prior to discharge, they will need to let us  know and we can arrange as outlined above.

## 2023-10-06 NOTE — Progress Notes (Signed)
 Call placed to [facility] to see ETA on CT results. Currently being read by Dr. Joshua. Patient updated by bedside nurse.

## 2023-10-06 NOTE — Care Management CC44 (Signed)
 Condition Code 44 Documentation Completed  Patient Details  Name: FERMON URETA MRN: [id number] Date of Birth: 08/25/1950   Condition Code 44 given:  Yes Patient signature on Condition Code 44 notice:  Yes Documentation of 2 MD's agreement:  Yes Code 44 added to claim:  Yes    Marinda Cooks, RN 10/06/2023, 2:23 PM

## 2023-10-06 NOTE — Progress Notes (Signed)
 Mobility Specialist - Progress Note     10/06/23 1432  Mobility  Activity Ambulated independently  Level of Assistance Independent  Assistive Device None  Distance Ambulated (ft) 320 ft  Range of Motion/Exercises Active  Activity Response Tolerated well  Mobility Referral Yes  Mobility visit 1 Mobility  Mobility Specialist Start Time (ACUTE ONLY) 1423  Mobility Specialist Stop Time (ACUTE ONLY) 1432  Mobility Specialist Time Calculation (min) (ACUTE ONLY) 9 min   Pt standing in room

## 2023-10-06 NOTE — Evaluation (Signed)
 Occupational Therapy Evaluation Patient Details Name: Charles Mccann MRN: [id number] DOB: 02/12/1950 Today's Date: 10/06/2023   History of Present Illness   Charles Mccann is a pleasant 73 y.o. male with medical history significant for COPD, GERD, HTN, HLD, recurrent dizziness/balance issues with negative workup who was brought into ED after he was sent by his neurologist for abnormal MRI.  According to the patient since January he has been experiencing episodes of dizziness and feeling off balance.     Clinical Impressions Pt seen for OT evaluation this date. Prior to hospital admission, pt was independent in all aspects of ADL/IADL and functional mobility, and denies falls history in past 12 months. On arrival to the pt room, pt was standing at the sink pouring himself some water with no UE support, agreeable to OT evaluation. Pt lives with his spouse in a two story home with a level entry, all necessities are on main level. Currently pt reporting vision related symptoms have resolved, visual assessment was completed with all elements presenting WNL. Pt does reports he randomly feels he leans to the right during amb, but is able to quickly correct lean and continue ambulating with no falls or true LOB. Pt demonstrates baseline independence to perform ADL and mobility tasks and no strength, sensory, coordination, cognitive, or visual deficits appreciated with assessment. No skilled OT needs identified. Will sign off. Please re-consult if additional OT needs arise.              Functional Status Assessment   Patient has not had a recent decline in their functional status     Equipment Recommendations   None recommended by OT      Precautions/Restrictions   Precautions Precautions: Fall Recall of Precautions/Restrictions: Intact Restrictions Weight Bearing Restrictions Per Provider Order: No     Mobility Bed Mobility               General bed mobility comments:  NT pt standing at bedside on arrival to room, managing IV pole around the bed.    Transfers Overall transfer level: Independent                 General transfer comment: Amb around nurses station with initial hold of the IV pole, completed 90% of lap with no DME. No LOB was observed during head turns or a quick left turn around unit.      Balance Overall balance assessment: Independent (Pt reports he randomly feels he leans to the right during amb, but is able to quickly correct lean and continue with no falls or LOB.)                                         ADL either performed or assessed with clinical judgement   ADL Overall ADL's : Independent                                       General ADL Comments: Pt demonstrated LB dressing and standing sink level tasks independently.     Vision Baseline Vision/History: 1 Wears glasses Patient Visual Report: No change from baseline Vision Assessment?: Yes Eye Alignment: Within Functional Limits Ocular Range of Motion: Within Functional Limits Alignment/Gaze Preference: Within Defined Limits Tracking/Visual Pursuits: Able to track stimulus in all quads without difficulty  Saccades: Within functional limits Visual Fields: No apparent deficits     Perception Perception: Within Functional Limits       Praxis Praxis: WFL       Pertinent Vitals/Pain Pain Assessment Pain Assessment: No/denies pain     Extremity/Trunk Assessment Upper Extremity Assessment: LUE chronic shoulder deficits limiting full ROM.   Upper Extremity Assessment: Overall WFL for tasks assessed   Lower Extremity Assessment Lower Extremity Assessment: Defer to PT evaluation   Cervical / Trunk Assessment Cervical / Trunk Assessment: Normal   Communication Communication Communication: No apparent difficulties   Cognition Arousal: Alert Behavior During Therapy: WFL for tasks assessed/performed Cognition: No apparent  impairments             OT - Cognition Comments: A/Ox4                 Following commands: Intact       Cueing  General Comments   Cueing Techniques: Verbal cues  Pt eager to be discharged from hospital, he mentioned that being admitted lead him to missing his scheduled tee time yesterday.   Exercises Exercises: Other exercises Other Exercises Other Exercises: Edu; Role of OT eval, safe ADL completion, fall prevention techniques        Home Living Family/patient expects to be discharged to:: Private residence Living Arrangements: Spouse/significant other Available Help at Discharge: Family;Available 24 hours/day Type of Home: House Home Access: Level entry     Home Layout: Two level;Able to live on main level with bedroom/bathroom Alternate Level Stairs-Number of Steps: 12 Alternate Level Stairs-Rails: Can reach both;Left;Right Bathroom Shower/Tub: Producer, television/film/video: Handicapped height     Home Equipment: Agricultural consultant (2 wheels)          Prior Functioning/Environment Prior Level of Function : Independent/Modified Independent;Driving             Mobility Comments: No DME use at baseline, owns a RW from post op knee replacements but no longer uses ADLs Comments: Indep    OT Problem List:     OT Treatment/Interventions:        OT Goals(Current goals can be found in the care plan section)   Acute Rehab OT Goals Patient Stated Goal: Go home soon OT Goal Formulation: With patient Time For Goal Achievement: 10/20/23 Potential to Achieve Goals: Good   OT Frequency:       Co-evaluation              AM-PAC OT 6 Clicks Daily Activity     Outcome Measure Help from another person eating meals?: None Help from another person taking care of personal grooming?: None Help from another person toileting, which includes using toliet, bedpan, or urinal?: None Help from another person bathing (including washing, rinsing,  drying)?: None Help from another person to put on and taking off regular upper body clothing?: None Help from another person to put on and taking off regular lower body clothing?: None 6 Click Score: 24   End of Session Equipment Utilized During Treatment: Other (comment) (initially use of IV pole) Nurse Communication: Mobility status  Activity Tolerance: Patient tolerated treatment well Patient left: in bed;with call bell/phone within reach                   Time: 0902-0916 OT Time Calculation (min): 14 min Charges:  OT General Charges $OT Visit: 1 Visit OT Evaluation $OT Eval Low Complexity: 1 Low  Eleonore Shippee M.S. OTR/L  10/06/23, 9:55 AM

## 2023-10-06 NOTE — Progress Notes (Signed)
*  PRELIMINARY RESULTS* Echocardiogram 2D Echocardiogram has been performed.  Charles Mccann Charles Mccann 10/06/2023, 11:33 AM

## 2023-10-06 NOTE — Progress Notes (Signed)
 SLP Cancellation Note  Patient Details Name: Charles Mccann MRN: [id number] DOB: January 15, 1951   Cancelled treatment:       Reason Eval/Treat Not Completed: SLP screened, no needs identified, will sign off  Per neurology consult, Denies any current weakness or numbness of the extremities. No aphasia or slurred speech. His off-balance symptoms have been static since January. SLP screen completed, with pt/spouse denying cognitive communication concerns. No SLP services indicated at this time.   [place] Hibo Blasdell Clapp, MS, CCC-SLP Speech Language Pathologist Rehab Services; [facility] [phone] (ascom)    [place] J Clapp 10/06/2023, 12:56 PM

## 2023-10-10 ENCOUNTER — Encounter: Payer: Self-pay | Admitting: Internal Medicine

## 2023-10-10 ENCOUNTER — Telehealth: Payer: Self-pay

## 2023-10-10 NOTE — Telephone Encounter (Signed)
 Appointment for: Charles Mccann ([id number]) Visit type: ACUTE (8007) 10/22/2023 5:00 PM (30 minutes) with Verneita LITTIE Kettering in [facility]   Patient comments: Follow-up on MRI (2 acute lacunar infarcts) & emergency room visit & suggested drug changes by ER doctors  We received the above notification for patient.  Patient scheduled himself for an acute virtual visit with Dr. Kettering on 10/22/2023 at 5pm.    Patient was discharged from the hospital on 10/06/2023, and needs to be scheduled for a hospital follow-up, but Dr. Lula schedule is full.  The last business day of the 2-week window after patient's discharge date is 10/19/2023.  Please let us  know how Dr. Kettering would like for us  to schedule.

## 2023-10-10 NOTE — Telephone Encounter (Signed)
 LMTCB

## 2023-10-11 NOTE — Telephone Encounter (Signed)
 Spoke with pt and scheduled him for Tuesday at 9 am.

## 2023-10-16 ENCOUNTER — Encounter: Payer: Self-pay | Admitting: Internal Medicine

## 2023-10-16 ENCOUNTER — Telehealth (INDEPENDENT_AMBULATORY_CARE_PROVIDER_SITE_OTHER): Admitting: Internal Medicine

## 2023-10-16 ENCOUNTER — Other Ambulatory Visit: Payer: Self-pay | Admitting: Internal Medicine

## 2023-10-16 VITALS — Ht 72.0 in | Wt 210.0 lb

## 2023-10-16 DIAGNOSIS — R2689 Other abnormalities of gait and mobility: Secondary | ICD-10-CM

## 2023-10-16 DIAGNOSIS — Z8673 Personal history of transient ischemic attack (TIA), and cerebral infarction without residual deficits: Secondary | ICD-10-CM

## 2023-10-16 DIAGNOSIS — E538 Deficiency of other specified B group vitamins: Secondary | ICD-10-CM

## 2023-10-16 DIAGNOSIS — N179 Acute kidney failure, unspecified: Secondary | ICD-10-CM | POA: Insufficient documentation

## 2023-10-16 DIAGNOSIS — E785 Hyperlipidemia, unspecified: Secondary | ICD-10-CM

## 2023-10-16 DIAGNOSIS — Z09 Encounter for follow-up examination after completed treatment for conditions other than malignant neoplasm: Secondary | ICD-10-CM | POA: Insufficient documentation

## 2023-10-16 MED ORDER — HYDROCHLOROTHIAZIDE 25 MG PO TABS: 25.0000 mg | ORAL_TABLET | Freq: Every day | ORAL | 3 refills | Status: DC

## 2023-10-16 MED ORDER — PFIZER COVID-19 VAC BIVALENT 30 MCG/0.3ML IM SUSP: 0.3000 mL | Freq: Once | INTRAMUSCULAR | 0 refills | Status: AC

## 2023-10-16 MED ORDER — ATORVASTATIN CALCIUM 40 MG PO TABS: 40.0000 mg | ORAL_TABLET | Freq: Every day | ORAL | 5 refills | Status: DC

## 2023-10-16 NOTE — Assessment & Plan Note (Signed)
 Noted during neurology workup for balance and cognitive issues. He has completed 4 weekly parenteral doses and is now receiving monthjly parenteral doses and oral doses.  Along with folic acid    No results found for: VITAMINB12

## 2023-10-16 NOTE — Assessment & Plan Note (Signed)
 Drop in GFR noted at admission needs rechecking.

## 2023-10-16 NOTE — Assessment & Plan Note (Signed)
 S/p ENT and neurology eval.  Continue b12

## 2023-10-16 NOTE — Assessment & Plan Note (Signed)
 Patient had several questions about his discharge medications , which he states were not discussed with him prior to discharge.   He  is stable post discharge; all  questions about discharge plans were addressed  at the visit today for hospital follow up. All labs , imaging studies and progress notes from admission were reviewed with patient today

## 2023-10-16 NOTE — Assessment & Plan Note (Signed)
 LDL 73 ON 20 MG.  INCREASE TO 40 MG IF TOLERATED (PATIENT RELUCTANT TO INCREASE DOSE TO 80 MG AS HOSPITALIST ADVISED)

## 2023-10-16 NOTE — Assessment & Plan Note (Addendum)
 RIGHT POSTERIOR FRONTAL LOBE AND LEFT PARIETAL. NOTED ON AUGUST 2025 MRI ORDERED BY [facility].  Embolic vs lacunar etiology . Home BP readings have been < 130 systolic and 70-80 range diastolic ion hydrochlorothiazide  and metoprolol  both at max tolerated doses.  Agree with asa and higher dose of statin (patient has agreed to 40 mg trial)

## 2023-10-16 NOTE — Progress Notes (Signed)
 Virtual Visit via Caregility   Note   This format is felt to be most appropriate for this patient at this time.  All issues noted in this document were discussed and addressed.  No physical exam was performed (except for noted visual exam findings with Video Visits).   I connected with Mr Charles Mccann  on 10/16/23 at  9:00 AM EDT by a video enabled telemedicine application  and verified that I am speaking with the correct person using two identifiers. Location patient: home Location provider: work or home office Persons participating in the virtual visit: patient, provider  I discussed the limitations, risks, security and privacy concerns of performing an evaluation and management service by telephone and the availability of in person appointments. I also discussed with the patient that there may be a patient responsible charge related to this service. The patient expressed understanding and agreed to proceed.  Reason for visit: hospital follow up   HPI:  73 yr old male with history of Type 2 DM, hyperlipidemia, hypertension, congenital heart disease s/p TOF repair, recently admitted for workup of lacunar infarcts noted on August 29 MRI brain done during neurology evaluation for workup of migraine,  [facility]CI .  He was admitted to [facility] on August 29 after being contacted by Dr Maree re abnormal MRI brain  showing punctate foci of restricted diffusion within the centrum ovale bilaterally involving the right posterior frontal lobe and left parietal lobe compatible with acute lacunar infarct.   Workup for embolic origin INCLUDED   CT angiogram of the head and neck which was negative for large vessel occlusion 2D echocardiogram showed a normal LVEF of 50 to 55% with no regional wall motion abnormalities.  Mild LVH.  No cardiac thrombus   Patient was advised  to follow-up with his cardiologist for an event monitor and possible TEE and he was discharged on August 30 with ne2 rx for asa and higher dose of  atorvastatin , .  He has been taking aspirin  since discharge but has not increased atorvastatin  dose   Acute renal failure: patient's cr rsoe from 1.13 pre admission to 1.5 at admission.  He was advised to stop celebrex  , which he takes daily, with tylenol  for management of chronic back pain , and omeprazole. He has continued to take both     ROS: See pertinent positives and negatives per HPI.  Past Medical History:  Diagnosis Date   Acute stroke due to ischemia (HCC) 10/05/2023   Allergy since birth   Arthritis    hands, shoulder, knees   Asthma    managed by [facility]   Blood transfusion without reported diagnosis during heart surgery 1974   Bronchitis, chronic obstructive, with exacerbation (HCC) 04/26/2014   Cataract    both eyes corrected by surgery   COPD (chronic obstructive pulmonary disease) (HCC)    GERD (gastroesophageal reflux disease)    controlled only with nexium priro prevacid failutre   Heart murmur Since birth   Hypercholesterolemia    Hyperlipidemia with target LDL less than 100 05/28/2011   Hypertension    Impotence due to erectile dysfunction 04/26/2014   Left inguinal hernia 04/07/2016   Obstructive sleep apnea of adult 2012   on CPAP  tolerating ,  12 cm H20 , room air    Palpitations 11/27/2011   Sleep apnea CPAP since 2012   Tetralogy of Fallot    Thoracic aortic aneurysm [facility])    Treadmill stress test negative for angina pectoris 2005   Umbilical hernia  without obstruction and without gangrene 03/10/2016    Past Surgical History:  Procedure Laterality Date   BRAIN SURGERY  before 5th birthday   CARDIAC CATHETERIZATION     CATARACT EXTRACTION W/PHACO Right 11/16/2014   Procedure: CATARACT EXTRACTION PHACO AND INTRAOCULAR LENS PLACEMENT (IOC);  Surgeon: Steven Dingeldein, MD;  Location: [facility] ORS;  Service: Ophthalmology;  Laterality: Right;  Onu#[id number] H US :01:03.9 AP%:24.3% CDE:30.12   CATARACT EXTRACTION W/PHACO Left 02/21/2017   Procedure:  CATARACT EXTRACTION PHACO AND INTRAOCULAR LENS PLACEMENT (IOC) LEFT;  Surgeon: Mittie Gaskin, MD;  Location: [facility];  Service: Ophthalmology;  Laterality: Left;  requests later   COLONOSCOPY WITH PROPOFOL  N/A 06/17/2019   Procedure: COLONOSCOPY WITH PROPOFOL ;  Surgeon: Jinny Carmine, MD;  Location: [facility] ENDOSCOPY;  Service: Endoscopy;  Laterality: N/A;   EYE SURGERY  cataract - 2016, 2019   HERNIA REPAIR  triple hernia - 2018   INGUINAL HERNIA REPAIR Left 04/20/2016   Procedure: HERNIA REPAIR INGUINAL ADULT WITH MESH;  Surgeon: Carlin Pastel, MD;  Location: [facility] ORS;  Service: General;  Laterality: Left;   JOINT REPLACEMENT  03/01/20, 02/14/21   left knee, right knee replacement   TETRALOGY OF FALLOT REPAIR  1974   [facility] [place]   TONSILLECTOMY     UMBILICAL HERNIA REPAIR N/A 04/20/2016   Procedure: HERNIA REPAIR UMBILICAL ADULT WITH MESH ;  Surgeon: Carlin Pastel, MD;  Location: [facility] ORS;  Service: General;  Laterality: N/A;    Family History  Problem Relation Age of Onset   Early death Mother    Heart disease Father 91       CAD   Arthritis Father    Cancer Brother 29       liver CA mets to brain    Cancer Brother        colon Ca , stomach CA   Cancer Brother    Cancer Brother     SOCIAL HX:  reports that he has never smoked. He has never used smokeless tobacco. He reports current alcohol use of about 18.0 standard drinks of alcohol per week. He reports that he does not use drugs.    Current Outpatient Medications:    albuterol  (PROVENTIL  HFA;VENTOLIN  HFA) 108 (90 BASE) MCG/ACT inhaler, Inhale 2 puffs into the lungs every 6 (six) hours as needed., Disp: , Rfl:    aspirin  EC 81 MG tablet, Take 1 tablet (81 mg total) by mouth daily. Swallow whole., Disp: 30 tablet, Rfl: 12   azelastine  (ASTELIN ) 0.1 % nasal spray, Place 1 spray into the nose daily., Disp: , Rfl:    budesonide -formoterol (SYMBICORT) 160-4.5 MCG/ACT inhaler, Inhale 2 puffs into the lungs 2  (two) times daily., Disp: , Rfl:    celecoxib  (CELEBREX ) 200 MG capsule, Take 200 mg by mouth daily., Disp: , Rfl:    cetirizine (ZYRTEC) 10 MG tablet, Take 10 mg by mouth daily., Disp: , Rfl:    Cholecalciferol (VITAMIN D ) 2000 units CAPS, Take 2,000 Units by mouth every morning. , Disp: , Rfl:    COVID-19 mRNA bivalent vaccine, Pfizer, (PFIZER COVID-19 VAC BIVALENT) injection, Inject 0.3 mLs into the muscle once for 1 dose., Disp: 0.3 mL, Rfl: 0   cyanocobalamin (VITAMIN B12) 1000 MCG/ML injection, Inject 1,000 mcg into the muscle once a week. once a week for 4 weeks then once a month for 4 months, Disp: , Rfl:    diphenhydrAMINE  (BENADRYL ) 25 mg capsule, Take 25 mg by mouth at bedtime., Disp: , Rfl:    famotidine  (  PEPCID ) 20 MG tablet, Take 1 tablet (20 mg total) by mouth 2 (two) times daily., Disp: 180 tablet, Rfl: 3   fluticasone  (FLONASE ) 50 MCG/ACT nasal spray, Place 2 sprays into both nostrils at bedtime. , Disp: , Rfl:    folic acid  (FOLVITE ) 1 MG tablet, Take 1 tablet (1 mg total) by mouth daily., Disp: 30 tablet, Rfl: 0   levalbuterol  (XOPENEX ) 0.63 MG/3ML nebulizer solution, Take 0.63 mg by nebulization every 8 (eight) hours as needed for wheezing., Disp: , Rfl:    metoprolol  succinate (TOPROL -XL) 50 MG 24 hr tablet, Take 50 mg by mouth daily., Disp: , Rfl:    montelukast  (SINGULAIR ) 10 MG tablet, Take 1 tablet (10 mg total) by mouth at bedtime., Disp: 90 tablet, Rfl: 3   Multiple Vitamin (MULTIVITAMIN WITH MINERALS) TABS tablet, Take 1 tablet by mouth daily., Disp: 30 tablet, Rfl: 0   omeprazole (PRILOSEC) 40 MG capsule, Take 40 mg by mouth daily., Disp: , Rfl:    Potassium 99 MG TABS, Take 2 tablets by mouth daily., Disp: , Rfl:    predniSONE  (DELTASONE ) 5 MG tablet, Take 5 mg by mouth every evening., Disp: , Rfl:    thiamine  (VITAMIN B-1) 100 MG tablet, Take 1 tablet (100 mg total) by mouth daily., Disp: 30 tablet, Rfl: 0   tirzepatide  (MOUNJARO ) 10 MG/0.5ML Pen, Inject 10 mg into  the skin once a week., Disp: 6 mL, Rfl: 2   atorvastatin  (LIPITOR) 40 MG tablet, Take 1 tablet (40 mg total) by mouth daily., Disp: 30 tablet, Rfl: 5   hydrochlorothiazide  (HYDRODIURIL ) 25 MG tablet, Take 1 tablet (25 mg total) by mouth daily., Disp: 90 tablet, Rfl: 3  EXAM:  VITALS per patient if applicable:  GENERAL: alert, oriented, appears well and in no acute distress  HEENT: atraumatic, conjunttiva clear, no obvious abnormalities on inspection of external nose and ears  NECK: normal movements of the head and neck  LUNGS: on inspection no signs of respiratory distress, breathing rate appears normal, no obvious gross SOB, gasping or wheezing  CV: no obvious cyanosis  MS: moves all visible extremities without noticeable abnormality  PSYCH/NEURO: pleasant and cooperative, no obvious depression or anxiety, speech and thought processing grossly intact  ASSESSMENT AND PLAN: B12 deficiency Assessment & Plan: Noted during neurology workup for balance and cognitive issues. He has completed 4 weekly parenteral doses and is now receiving monthjly parenteral doses and oral doses.  Along with folic acid    No results found for: VITAMINB12    History of lacunar cerebrovascular accident (CVA) Assessment & Plan: RIGHT POSTERIOR FRONTAL LOBE AND LEFT PARIETAL. NOTED ON AUGUST 2025 MRI ORDERED BY [facility].  Embolic vs lacunar etiology . Home BP readings have been < 130 systolic and 70-80 range diastolic ion hydrochlorothiazide  and metoprolol  both at max tolerated doses.  Agree with asa and higher dose of statin (patient has agreed to 40 mg trial)   Hyperlipidemia with target LDL less than 70 Assessment & Plan: LDL 73 ON 20 MG.  INCREASE TO 40 MG IF TOLERATED (PATIENT RELUCTANT TO INCREASE DOSE TO 80 MG AS HOSPITALIST ADVISED)   Balance problem Assessment & Plan: S/p ENT and neurology eval.  Continue b12    Acute renal failure, unspecified acute renal failure type [facility]) Assessment &  Plan: Drop in GFR noted at admission needs rechecking.     Hospital discharge follow-up Assessment & Plan: Patient had several questions about his discharge medications , which he states were not discussed with him prior  to discharge.   He  is stable post discharge; all  questions about discharge plans were addressed  at the visit today for hospital follow up. All labs , imaging studies and progress notes from admission were reviewed with patient today      Other orders -     hydroCHLOROthiazide ; Take 1 tablet (25 mg total) by mouth daily.  Dispense: 90 tablet; Refill: 3 -     Atorvastatin  Calcium ; Take 1 tablet (40 mg total) by mouth daily.  Dispense: 30 tablet; Refill: 5 -     Pfizer COVID-19 Vac Bivalent; Inject 0.3 mLs into the muscle once for 1 dose.  Dispense: 0.3 mL; Refill: 0      I discussed the assessment and treatment plan with the patient. The patient was provided an opportunity to ask questions and all were answered. The patient agreed with the plan and demonstrated an understanding of the instructions.   The patient was advised to call back or seek an in-person evaluation if the symptoms worsen or if the condition fails to improve as anticipated.   I spent 30 minutes dedicated to the care of this patient on the date of this encounter to include pre-visit review of patient's medical history,  including recent ER visit, imaging studies and labs, face-to-face time with the patient , and post visit ordering of testing and therapeutics.    Verneita LITTIE Kettering, MD

## 2023-10-22 ENCOUNTER — Ambulatory Visit: Admitting: Internal Medicine

## 2023-10-25 DIAGNOSIS — J4530 Mild persistent asthma, uncomplicated: Secondary | ICD-10-CM | POA: Diagnosis not present

## 2023-10-25 DIAGNOSIS — J3089 Other allergic rhinitis: Secondary | ICD-10-CM | POA: Diagnosis not present

## 2023-10-25 DIAGNOSIS — L501 Idiopathic urticaria: Secondary | ICD-10-CM | POA: Diagnosis not present

## 2023-10-25 DIAGNOSIS — Z91013 Allergy to seafood: Secondary | ICD-10-CM | POA: Diagnosis not present

## 2023-10-26 ENCOUNTER — Ambulatory Visit (INDEPENDENT_AMBULATORY_CARE_PROVIDER_SITE_OTHER): Admitting: *Deleted

## 2023-10-26 VITALS — Ht 72.0 in | Wt 210.0 lb

## 2023-10-26 DIAGNOSIS — Z0000 Encounter for general adult medical examination without abnormal findings: Secondary | ICD-10-CM | POA: Diagnosis not present

## 2023-10-26 NOTE — Patient Instructions (Addendum)
 Charles Mccann,  Thank you for taking the time for your Medicare Wellness Visit. I appreciate your continued commitment to your health goals. Please review the care plan we discussed, and feel free to reach out if I can assist you further.  Medicare recommends these wellness visits once per year to help you and your care team stay ahead of potential health issues. These visits are designed to focus on prevention, allowing your provider to concentrate on managing your acute and chronic conditions during your regular appointments.  Please note that Annual Wellness Visits do not include a physical exam. Some assessments may be limited, especially if the visit was conducted virtually. If needed, we may recommend a separate in-person follow-up with your provider.  Ongoing Care Seeing your primary care provider every 3 to 6 months helps us  monitor your health and provide consistent, personalized care.  Remember to update your flu vaccine.   Referrals If a referral was made during today's visit and you haven't received any updates within two weeks, please contact the referred provider directly to check on the status.  Recommended Screenings:  Health Maintenance  Topic Date Due   Flu Shot  05/06/2024*   COVID-19 Vaccine (7 - 2025-26 season) 12/17/2023   Eye exam for diabetics  02/12/2024   Complete foot exam   02/14/2024   Hemoglobin A1C  02/21/2024   Colon Cancer Screening  06/16/2024   Yearly kidney health urinalysis for diabetes  08/20/2024   Yearly kidney function blood test for diabetes  10/05/2024   Medicare Annual Wellness Visit  10/25/2024   DTaP/Tdap/Td vaccine (3 - Td or Tdap) 02/14/2033   Pneumococcal Vaccine for age over 24  Completed   Hepatitis C Screening  Completed   Zoster (Shingles) Vaccine  Completed   HPV Vaccine  Aged Out   Meningitis B Vaccine  Aged Out  *Topic was postponed. The date shown is not the original due date.       10/26/2023    9:44 AM  Advanced  Directives  Does Patient Have a Medical Advance Directive? Yes  Type of Estate agent of [place];Living will  Copy of Healthcare Power of Attorney in Chart? No - copy requested  Would patient like information on creating a medical advance directive? No - Patient declined   Advance Care Planning is important because it: Ensures you receive medical care that aligns with your values, goals, and preferences. Provides guidance to your family and loved ones, reducing the emotional burden of decision-making during critical moments.  Vision: Annual vision screenings are recommended for early detection of glaucoma, cataracts, and diabetic retinopathy. These exams can also reveal signs of chronic conditions such as diabetes and high blood pressure.  Dental: Annual dental screenings help detect early signs of oral cancer, gum disease, and other conditions linked to overall health, including heart disease and diabetes.  Please see the attached documents for additional preventive care recommendations.

## 2023-10-26 NOTE — Progress Notes (Signed)
 Subjective:   Charles Mccann is a 73 y.o. who presents for a Medicare Wellness preventive visit.  As a reminder, Annual Wellness Visits don't include a physical exam, and some assessments may be limited, especially if this visit is performed virtually. We may recommend an in-person follow-up visit with your provider if needed.  Visit Complete: Virtual I connected with  Charles Mccann Mccann on 10/26/23 by a audio enabled telemedicine application and verified that I am speaking with the correct person using two identifiers.  Patient Location: Home  Provider Location: Home Office  I discussed the limitations of evaluation and management by telemedicine. The patient expressed understanding and agreed to proceed.  Vital Signs: Because this visit was a virtual/telehealth visit, some criteria may be missing or patient reported. Any vitals not documented were not able to be obtained and vitals that have been documented are patient reported.  VideoDeclined- This patient declined Librarian, academic. Therefore the visit was completed with audio only.  Persons Participating in Visit: Patient.  AWV Questionnaire: No: Patient Medicare AWV questionnaire was not completed prior to this visit.  Cardiac Risk Factors include: advanced age (>49men, >22 women);diabetes mellitus;dyslipidemia;hypertension;male gender     Objective:    Today's Vitals   10/26/23 0928  Weight: 210 lb (95.3 kg)  Height: 6' (1.829 m)   Body mass index is 28.48 kg/m.     10/26/2023    9:44 AM 10/05/2023    1:56 PM 10/25/2022    9:26 AM 10/19/2021   11:16 AM 09/07/2020   11:23 AM 09/05/2019   11:33 AM 09/05/2019   11:15 AM  Advanced Directives  Does Patient Have a Medical Advance Directive? Yes No Yes Yes Yes Yes Yes  Type of Estate agent of [place];Living will  Healthcare Power of [place];Living will Healthcare Power of [place];Living will Healthcare Power of  [place];Living will Healthcare Power of [place];Living will Healthcare Power of [place];Living will  Does patient want to make changes to medical advance directive?    No - Patient declined No - Patient declined No - Patient declined No - Patient declined  Copy of Healthcare Power of Attorney in Chart? No - copy requested  No - copy requested No - copy requested No - copy requested No - copy requested No - copy requested  Would patient like information on creating a medical advance directive? No - Patient declined No - Patient declined         Current Medications (verified) Outpatient Encounter Medications as of 10/26/2023  Medication Sig   albuterol  (PROVENTIL  HFA;VENTOLIN  HFA) 108 (90 BASE) MCG/ACT inhaler Inhale 2 puffs into the lungs every 6 (six) hours as needed.   aspirin  EC 81 MG tablet Take 1 tablet (81 mg total) by mouth daily. Swallow whole.   atorvastatin  (LIPITOR) 40 MG tablet Take 1 tablet (40 mg total) by mouth daily.   azelastine  (ASTELIN ) 0.1 % nasal spray Place 1 spray into the nose daily.   budesonide -formoterol (SYMBICORT) 160-4.5 MCG/ACT inhaler Inhale 2 puffs into the lungs 2 (two) times daily.   celecoxib  (CELEBREX ) 200 MG capsule Take 200 mg by mouth daily.   cetirizine (ZYRTEC) 10 MG tablet Take 10 mg by mouth daily.   Cholecalciferol (VITAMIN D ) 2000 units CAPS Take 2,000 Units by mouth every morning.    cyanocobalamin (VITAMIN B12) 1000 MCG/ML injection Inject 1,000 mcg into the muscle once a week. once a week for 4 weeks then once a month for 4 months (  Patient taking differently: Inject 1,000 mcg into the muscle every 30 (thirty) days. once a week for 4 weeks then once a month for 4 months)   diphenhydrAMINE  (BENADRYL ) 25 mg capsule Take 25 mg by mouth at bedtime.   famotidine  (PEPCID ) 20 MG tablet Take 1 tablet (20 mg total) by mouth 2 (two) times daily.   fluticasone  (FLONASE ) 50 MCG/ACT nasal spray Place 2 sprays into both nostrils at bedtime.    folic acid   (FOLVITE ) 1 MG tablet Take 1 tablet (1 mg total) by mouth daily.   hydrochlorothiazide  (HYDRODIURIL ) 25 MG tablet Take 1 tablet (25 mg total) by mouth daily.   levalbuterol  (XOPENEX ) 0.63 MG/3ML nebulizer solution Take 0.63 mg by nebulization every 8 (eight) hours as needed for wheezing.   metoprolol  succinate (TOPROL -XL) 50 MG 24 hr tablet Take 50 mg by mouth daily.   montelukast  (SINGULAIR ) 10 MG tablet Take 1 tablet (10 mg total) by mouth at bedtime.   omeprazole (PRILOSEC) 40 MG capsule Take 40 mg by mouth daily.   Potassium 99 MG TABS Take 2 tablets by mouth daily. (Patient taking differently: Take 1 tablet by mouth daily.)   thiamine  (VITAMIN B-1) 100 MG tablet Take 1 tablet (100 mg total) by mouth daily.   tirzepatide  (MOUNJARO ) 10 MG/0.5ML Pen Inject 10 mg into the skin once a week.   Multiple Vitamin (MULTIVITAMIN WITH MINERALS) TABS tablet Take 1 tablet by mouth daily. (Patient not taking: Reported on 10/26/2023)   predniSONE  (DELTASONE ) 5 MG tablet Take 5 mg by mouth every evening. (Patient not taking: Reported on 10/26/2023)   No facility-administered encounter medications on file as of 10/26/2023.    Allergies (verified) Other and Fish allergy   History: Past Medical History:  Diagnosis Date   Acute stroke due to ischemia (HCC) 10/05/2023   Allergy since birth   Arthritis    hands, shoulder, knees   Asthma    managed by [facility]   Blood transfusion without reported diagnosis during heart surgery 1974   Bronchitis, chronic obstructive, with exacerbation (HCC) 04/26/2014   Cataract    both eyes corrected by surgery   COPD (chronic obstructive pulmonary disease) (HCC)    GERD (gastroesophageal reflux disease)    controlled only with nexium priro prevacid failutre   Heart murmur Since birth   Hypercholesterolemia    Hyperlipidemia with target LDL less than 100 05/28/2011   Hypertension    Impotence due to erectile dysfunction 04/26/2014   Left inguinal hernia 04/07/2016    Obstructive sleep apnea of adult 2012   on CPAP  tolerating ,  12 cm H20 , room air    Palpitations 11/27/2011   Sleep apnea CPAP since 2012   Tetralogy of Fallot    Thoracic aortic aneurysm [facility])    Treadmill stress test negative for angina pectoris 2005   Umbilical hernia without obstruction and without gangrene 03/10/2016   Past Surgical History:  Procedure Laterality Date   BRAIN SURGERY  before 5th birthday   CARDIAC CATHETERIZATION     CATARACT EXTRACTION W/PHACO Right 11/16/2014   Procedure: CATARACT EXTRACTION PHACO AND INTRAOCULAR LENS PLACEMENT (IOC);  Surgeon: Steven Dingeldein, MD;  Location: [facility] ORS;  Service: Ophthalmology;  Laterality: Right;  Onu#[id number] H US :01:03.9 AP%:24.3% CDE:30.12   CATARACT EXTRACTION W/PHACO Left 02/21/2017   Procedure: CATARACT EXTRACTION PHACO AND INTRAOCULAR LENS PLACEMENT (IOC) LEFT;  Surgeon: Mittie Gaskin, MD;  Location: [facility];  Service: Ophthalmology;  Laterality: Left;  requests later   COLONOSCOPY WITH PROPOFOL  N/A  06/17/2019   Procedure: COLONOSCOPY WITH PROPOFOL ;  Surgeon: Jinny Carmine, MD;  Location: [facility];  Service: Endoscopy;  Laterality: N/A;   EYE SURGERY  cataract - 2016, 2019   HERNIA REPAIR  triple hernia - 2018   INGUINAL HERNIA REPAIR Left 04/20/2016   Procedure: HERNIA REPAIR INGUINAL ADULT WITH MESH;  Surgeon: Carlin Pastel, MD;  Location: [facility] ORS;  Service: General;  Laterality: Left;   JOINT REPLACEMENT  03/01/20, 02/14/21   left knee, right knee replacement   TETRALOGY OF FALLOT REPAIR  1974   [facility] [place]   TONSILLECTOMY     UMBILICAL HERNIA REPAIR N/A 04/20/2016   Procedure: HERNIA REPAIR UMBILICAL ADULT WITH MESH ;  Surgeon: Carlin Pastel, MD;  Location: [facility] ORS;  Service: General;  Laterality: N/A;   Family History  Problem Relation Age of Onset   Early death Mother    Heart disease Father 26       CAD   Arthritis Father    Cancer Brother 57       liver CA mets to  brain    Cancer Brother        colon Ca , stomach CA   Cancer Brother    Cancer Brother    Social History   Socioeconomic History   Marital status: Married    Spouse name: Not on file   Number of children: Not on file   Years of education: Not on file   Highest education level: Master's degree (e.g., MA, MS, MEng, MEd, MSW, MBA)  Occupational History   Occupation: retired Research scientist (medical)     Comment: travels  to [place] weekly   Tobacco Use   Smoking status: Never   Smokeless tobacco: Never  Vaping Use   Vaping status: Never Used  Substance and Sexual Activity   Alcohol use: Yes    Alcohol/week: 18.0 standard drinks of alcohol    Types: 7 Glasses of wine, 7 Cans of beer, 4 Shots of liquor per week    Comment:     Drug use: No   Sexual activity: Not Currently  Other Topics Concern   Not on file  Social History Narrative   Married   Social Drivers of Health   Financial Resource Strain: Low Risk  (10/26/2023)   Overall Financial Resource Strain (CARDIA)    Difficulty of Paying Living Expenses: Not hard at all  Food Insecurity: No Food Insecurity (10/26/2023)   Hunger Vital Sign    Worried About Running Out of Food in the Last Year: Never true    Ran Out of Food in the Last Year: Never true  Transportation Needs: No Transportation Needs (10/26/2023)   PRAPARE - Administrator, Civil Service (Medical): No    Lack of Transportation (Non-Medical): No  Physical Activity: Sufficiently Active (10/26/2023)   Exercise Vital Sign    Days of Exercise per Week: 3 days    Minutes of Exercise per Session: 120 min  Stress: No Stress Concern Present (10/26/2023)   [employer] of Occupational Health - Occupational Stress Questionnaire    Feeling of Stress: Not at all  Social Connections: Moderately Integrated (10/26/2023)   Social Connection and Isolation Panel    Frequency of Communication with Friends and Family: More than three times a week    Frequency of Social Gatherings  with Friends and Family: More than three times a week    Attends Religious Services: Never    Database administrator or Organizations: Yes  Attends Banker Meetings: More than 4 times per year    Marital Status: Married  Recent Concern: Social Connections - Moderately Isolated (08/14/2023)   Social Connection and Isolation Panel    Frequency of Communication with Friends and Family: Never    Frequency of Social Gatherings with Friends and Family: Once a week    Attends Religious Services: Patient declined    Database administrator or Organizations: Yes    Attends Engineer, structural: More than 4 times per year    Marital Status: Married    Tobacco Counseling Counseling given: Not Answered    Clinical Intake:  Pre-visit preparation completed: Yes  Pain : No/denies pain     BMI - recorded: 28.48 Nutritional Status: BMI 25 -29 Overweight Nutritional Risks: None Diabetes: Yes CBG done?: No  Lab Results  Component Value Date   HGBA1C 6.1 08/21/2023   HGBA1C 6.2 02/14/2023   HGBA1C 5.9 06/02/2022     How often do you need to have someone help you when you read instructions, pamphlets, or other written materials from your doctor or pharmacy?: 1 - Never  Interpreter Needed?: No  Information entered by :: R. Elverna Caffee LPN   Activities of Daily Living     10/26/2023    9:31 AM 10/05/2023    5:31 PM  In your present state of health, do you have any difficulty performing the following activities:  Hearing? 0   Vision? 0   Difficulty concentrating or making decisions? 1   Walking or climbing stairs? 1   Dressing or bathing? 0   Doing errands, shopping? 0 0  Preparing Food and eating ? N   Using the Toilet? N   In the past six months, have you accidently leaked urine? N   Do you have problems with loss of bowel control? N   Managing your Medications? N   Managing your Finances? N   Housekeeping or managing your Housekeeping? N     Patient Care  Team: [name], MD as PCP - General (Internal Medicine) Jinny Carmine, MD as Consulting Physician (Gastroenterology) Dingeldein, Elspeth, MD (Ophthalmology) Maree Jannett POUR, MD as Consulting Physician (Neurology) Theot[facility] Lavelle BRAVO, MD as Referring Physician (Specialist) Pulikkotil, Odell DASEN, MD (Internal Medicine)  I have updated your Care Teams any recent Medical Services you may have received from other providers in the past year.     Assessment:   This is a routine wellness examination for [place].  Hearing/Vision screen Hearing Screening - Comments:: No issues Vision Screening - Comments:: glasses   Goals Addressed             This Visit's Progress    Patient Stated       Wants to lose some weight       Depression Screen     10/26/2023    9:41 AM 10/16/2023    8:49 AM 08/21/2023   10:45 AM 06/04/2023    5:02 PM 02/14/2023   10:36 AM 10/25/2022    9:22 AM 06/02/2022   10:24 AM  PHQ 2/9 Scores  PHQ - 2 Score 0 0 0 0 0 0 0  PHQ- 9 Score 0     0     Fall Risk     10/26/2023    9:34 AM 10/16/2023    8:49 AM 08/21/2023   10:45 AM 06/04/2023    5:02 PM 02/14/2023   10:36 AM  Fall Risk   Falls in the past year?  0 0 0 0 0  Number falls in past yr: 0 0 0 0 0  Injury with Fall? 0 0 0 0 0  Risk for fall due to : No Fall Risks No Fall Risks No Fall Risks No Fall Risks No Fall Risks  Follow up Falls evaluation completed;Falls prevention discussed Falls evaluation completed Falls evaluation completed Falls evaluation completed Falls evaluation completed    MEDICARE RISK AT HOME:  Medicare Risk at Home Any stairs in or around the home?: Yes If so, are there any without handrails?: No Home free of loose throw rugs in walkways, pet beds, electrical cords, etc?: Yes Adequate lighting in your home to reduce risk of falls?: Yes Life alert?: No Use of a cane, walker or w/c?: No Grab bars in the bathroom?: No Shower chair or bench in shower?: Yes Elevated toilet seat or a  handicapped toilet?: Yes  TIMED UP AND GO:  Was the test performed?  No  Cognitive Function: 6CIT completed        10/26/2023    9:47 AM 10/25/2022    9:27 AM 10/19/2021    1:12 PM 09/04/2018   11:23 AM  6CIT Screen  What Year? 0 points 0 points 0 points 0 points  What month? 0 points 0 points 0 points 0 points  What time? 0 points 0 points 0 points 0 points  Count back from 20 0 points 0 points  0 points  Months in reverse 0 points 0 points  0 points  Repeat phrase 0 points 2 points  0 points  Total Score 0 points 2 points  0 points    Immunizations Immunization History  Administered Date(s) Administered   Fluad Quad(high Dose 65+) 11/14/2018   INFLUENZA, HIGH DOSE SEASONAL PF 11/30/2022   Influenza Inj Mdck Quad Pf 11/17/2015   Influenza Split 11/27/2011   Influenza-Unspecified 11/25/2012, 11/23/2013, 12/26/2016, 11/20/2017, 11/17/2019, 11/01/2020, 11/14/2021   Moderna SARS-COV2 Booster Vaccination 04/01/2020, 10/29/2021   Moderna Sars-Covid-2 Vaccination 02/25/2019, 03/25/2019, 09/21/2019, 09/08/2020   Pneumococcal Conjugate-13 04/24/2014, 11/20/2017   Pneumococcal Polysaccharide-23 11/27/2011, 12/01/2019   Respiratory Syncytial Virus Vaccine,Recomb Aduvanted(Arexvy) 11/21/2021   Tdap 01/09/2013, 02/15/2023   Unspecified SARS-COV-2 Vaccination 11/30/2022   Zoster Recombinant(Shingrix) 12/10/2019, 03/16/2020   Zoster, Live 11/25/2012    Screening Tests Health Maintenance  Topic Date Due   Medicare Annual Wellness (AWV)  10/25/2023   COVID-19 Vaccine (6 - Moderna risk 2024-25 season) 11/01/2023 (Originally 10/08/2023)   Influenza Vaccine  05/06/2024 (Originally 09/07/2023)   OPHTHALMOLOGY EXAM  02/12/2024   FOOT EXAM  02/14/2024   HEMOGLOBIN A1C  02/21/2024   Colonoscopy  06/16/2024   Diabetic kidney evaluation - Urine ACR  08/20/2024   Diabetic kidney evaluation - eGFR measurement  10/05/2024   DTaP/Tdap/Td (3 - Td or Tdap) 02/14/2033   Pneumococcal Vaccine: 50+  Years  Completed   Hepatitis C Screening  Completed   Zoster Vaccines- Shingrix  Completed   HPV VACCINES  Aged Out   Meningococcal B Vaccine  Aged Out    Health Maintenance Items Addressed: Discussed the need to update flu vaccine.  Additional Screening:  Vision Screening: Recommended annual ophthalmology exams for early detection of glaucoma and other disorders of the eye.  Is the patient up to date with their annual eye exam?  Yes Who is the provider or what is the name of the office in which the patient attends annual eye exams? [facility]  Dental Screening: Recommended annual dental exams for proper oral hygiene  Community  Resource Referral / Chronic Care Management: CRR required this visit?  No   CCM required this visit?  No   Plan:    I have personally reviewed and noted the following in the patient's chart:   Medical and social history Use of alcohol, tobacco or illicit drugs  Current medications and supplements including opioid prescriptions. Patient is not currently taking opioid prescriptions. Functional ability and status Nutritional status Physical activity Advanced directives List of other physicians Hospitalizations, surgeries, and ER visits in previous 12 months Vitals Screenings to include cognitive, depression, and falls Referrals and appointments  In addition, I have reviewed and discussed with patient certain preventive protocols, quality metrics, and best practice recommendations. A written personalized care plan for preventive services as well as general preventive health recommendations were provided to patient.   Angeline Fredericks, LPN   [id number]   After Visit Summary: (MyChart) Due to this being a telephonic visit, the after visit summary with patients personalized plan was offered to patient via MyChart   Notes: Nothing significant to report at this time.

## 2023-10-30 DIAGNOSIS — M79672 Pain in left foot: Secondary | ICD-10-CM | POA: Diagnosis not present

## 2023-10-30 DIAGNOSIS — M7752 Other enthesopathy of left foot: Secondary | ICD-10-CM | POA: Diagnosis not present

## 2023-11-05 DIAGNOSIS — Z8774 Personal history of (corrected) congenital malformations of heart and circulatory system: Secondary | ICD-10-CM | POA: Diagnosis not present

## 2023-11-05 DIAGNOSIS — I700 Atherosclerosis of aorta: Secondary | ICD-10-CM | POA: Diagnosis not present

## 2023-11-05 DIAGNOSIS — I7121 Aneurysm of the ascending aorta, without rupture: Secondary | ICD-10-CM | POA: Diagnosis not present

## 2023-11-05 DIAGNOSIS — I350 Nonrheumatic aortic (valve) stenosis: Secondary | ICD-10-CM | POA: Diagnosis not present

## 2023-11-05 DIAGNOSIS — I082 Rheumatic disorders of both aortic and tricuspid valves: Secondary | ICD-10-CM | POA: Diagnosis not present

## 2023-11-05 DIAGNOSIS — I4510 Unspecified right bundle-branch block: Secondary | ICD-10-CM | POA: Diagnosis not present

## 2023-11-05 DIAGNOSIS — I491 Atrial premature depolarization: Secondary | ICD-10-CM | POA: Diagnosis not present

## 2023-11-05 DIAGNOSIS — I10 Essential (primary) hypertension: Secondary | ICD-10-CM | POA: Diagnosis not present

## 2023-11-06 DIAGNOSIS — S92355A Nondisplaced fracture of fifth metatarsal bone, left foot, initial encounter for closed fracture: Secondary | ICD-10-CM | POA: Diagnosis not present

## 2023-11-09 DIAGNOSIS — I7121 Aneurysm of the ascending aorta, without rupture: Secondary | ICD-10-CM | POA: Diagnosis not present

## 2023-11-09 DIAGNOSIS — I350 Nonrheumatic aortic (valve) stenosis: Secondary | ICD-10-CM | POA: Diagnosis not present

## 2023-11-13 ENCOUNTER — Other Ambulatory Visit: Payer: Self-pay | Admitting: Internal Medicine

## 2023-11-14 DIAGNOSIS — J301 Allergic rhinitis due to pollen: Secondary | ICD-10-CM | POA: Diagnosis not present

## 2023-11-14 DIAGNOSIS — G4733 Obstructive sleep apnea (adult) (pediatric): Secondary | ICD-10-CM | POA: Diagnosis not present

## 2023-11-14 DIAGNOSIS — J4540 Moderate persistent asthma, uncomplicated: Secondary | ICD-10-CM | POA: Diagnosis not present

## 2023-11-20 ENCOUNTER — Encounter: Payer: Self-pay | Admitting: Internal Medicine

## 2023-11-21 ENCOUNTER — Ambulatory Visit: Payer: Self-pay

## 2023-11-21 NOTE — Telephone Encounter (Signed)
 FYI Only or Action Required?: Action required by provider: request for appointment.  Patient was last seen in primary care on 10/16/2023 by [name], MD.  Called Nurse Triage reporting Joint Swelling.  Symptoms began several days ago.  Interventions attempted: Rest, hydration, or home remedies.  Symptoms are: unchanged.  Triage Disposition: See PCP When Office is Open (Within 3 Days)  Patient/caregiver understands and will follow disposition?: No, refuses disposition    Copied from CRM #[id number]. Topic: Clinical - Red Word Triage >> Nov 21, 2023 12:25 PM Charles Mccann wrote: Red Word that prompted transfer to Nurse Triage: Patient experiencing  feet & ankle swelling and fever of 101.2  and chills last night. Reason for Disposition  MODERATE ankle swelling (e.g., interferes with normal activities, can't move joint normally) (Exceptions: Itchy, localized swelling; swelling is chronic.)  Answer Assessment - Initial Assessment Questions Additional info: Acute appointment advised, offered next available at alternate regional office but patient declines this offer, he is requesting a work in to [employer] schedule only. Please advise.   1. LOCATION: Which ankle is swollen? Where is the swelling?     Bilateral  2. ONSET: When did the swelling start?     3-4 days  3. SWELLING: How bad is the swelling? Or, How large is it? (e.g., mild, moderate, severe; size of localized swelling)      moderate 4. PAIN: Is there any pain? If Yes, ask: How bad is it? (Scale 0-10; or none, mild, moderate, severe)     Left foot sore due to broken toe.  5. CAUSE: What do you think caused the ankle swelling?     unsure 6. OTHER SYMPTOMS: Do you have any other symptoms? (e.g., fever, chest pain, difficulty breathing, calf pain)     On antibiotics for upper respiratory-recovering well. In walking boot for toe fracture 7. PREGNANCY: Is there any chance you are pregnant? When was your last menstrual  period?  Protocols used: Ankle Swelling-A-AH

## 2023-11-21 NOTE — Telephone Encounter (Signed)
 Spoke with pt and scheduled him for an appt on Friday with Charanpreet Kaur, NP.

## 2023-11-21 NOTE — Telephone Encounter (Signed)
 Spoke with and he stated that about 3 to 4 days ago he started having bilateral foot and ankle swelling. Then last night he developed a temperature of 101.2 but it was gone this morning and hasn't had one since. Pt stated that he just completed a round of antibiotics and prednisone  prescribed by his pulmonologist. He stated that the antibiotics and prednisone  did help some but he does still have a little congestion left. No headaches, no nausea, no vomiting. No recent tick bite.   No available appts in office this week.

## 2023-11-22 NOTE — Progress Notes (Signed)
 Foot Swelling X 1 week Bil feet swelling Fluid seeping from L leg Denies pain, numbness/tingling, redness, HA, visual disturbances, chest pain  Fractured toe on L foot 2 weeks ago, originally thought swelling was d/t that  Rash X today Red patches all over chest and back Denies pain, itching, raised skin  Sent to ER Per Dr Nonnie, pt was informed they should be evaluated in the ER. Pt refused and signed Refusal of Treatment form.  CONCERN FOR CHF

## 2023-11-23 ENCOUNTER — Ambulatory Visit (INDEPENDENT_AMBULATORY_CARE_PROVIDER_SITE_OTHER): Admitting: Nurse Practitioner

## 2023-11-23 ENCOUNTER — Ambulatory Visit: Payer: Self-pay | Admitting: Nurse Practitioner

## 2023-11-23 ENCOUNTER — Ambulatory Visit

## 2023-11-23 ENCOUNTER — Encounter: Payer: Self-pay | Admitting: Nurse Practitioner

## 2023-11-23 VITALS — BP 114/74 | HR 71 | Temp 97.7°F | Ht 72.0 in | Wt 210.0 lb

## 2023-11-23 DIAGNOSIS — M7989 Other specified soft tissue disorders: Secondary | ICD-10-CM

## 2023-11-23 DIAGNOSIS — R21 Rash and other nonspecific skin eruption: Secondary | ICD-10-CM

## 2023-11-23 DIAGNOSIS — L089 Local infection of the skin and subcutaneous tissue, unspecified: Secondary | ICD-10-CM

## 2023-11-23 DIAGNOSIS — R509 Fever, unspecified: Secondary | ICD-10-CM

## 2023-11-23 DIAGNOSIS — R918 Other nonspecific abnormal finding of lung field: Secondary | ICD-10-CM | POA: Diagnosis not present

## 2023-11-23 LAB — POCT INFLUENZA A/B
Influenza A, POC: NEGATIVE
Influenza B, POC: NEGATIVE

## 2023-11-23 LAB — CBC WITH DIFFERENTIAL/PLATELET
Basophils Absolute: 0.1 K/uL (ref 0.0–0.1)
Basophils Relative: 0.9 % (ref 0.0–3.0)
Eosinophils Absolute: 0.3 K/uL (ref 0.0–0.7)
Eosinophils Relative: 4.3 % (ref 0.0–5.0)
HCT: 41.6 % (ref 39.0–52.0)
Hemoglobin: 13.6 g/dL (ref 13.0–17.0)
Lymphocytes Relative: 20.4 % (ref 12.0–46.0)
Lymphs Abs: 1.5 K/uL (ref 0.7–4.0)
MCHC: 32.8 g/dL (ref 30.0–36.0)
MCV: 92.3 fl (ref 78.0–100.0)
Monocytes Absolute: 0.6 K/uL (ref 0.1–1.0)
Monocytes Relative: 8.2 % (ref 3.0–12.0)
Neutro Abs: 5 K/uL (ref 1.4–7.7)
Neutrophils Relative %: 66.2 % (ref 43.0–77.0)
Platelets: 201 K/uL (ref 150.0–400.0)
RBC: 4.5 Mil/uL (ref 4.22–5.81)
RDW: 14.7 % (ref 11.5–15.5)
WBC: 7.6 K/uL (ref 4.0–10.5)

## 2023-11-23 LAB — HEPATIC FUNCTION PANEL
ALT: 26 U/L (ref 0–53)
AST: 19 U/L (ref 0–37)
Albumin: 3.7 g/dL (ref 3.5–5.2)
Alkaline Phosphatase: 57 U/L (ref 39–117)
Bilirubin, Direct: 0.3 mg/dL (ref 0.0–0.3)
Total Bilirubin: 1.2 mg/dL (ref 0.2–1.2)
Total Protein: 6.4 g/dL (ref 6.0–8.3)

## 2023-11-23 LAB — POC COVID19 BINAXNOW: SARS Coronavirus 2 Ag: NEGATIVE

## 2023-11-23 LAB — BASIC METABOLIC PANEL WITH GFR
BUN: 16 mg/dL (ref 6–23)
CO2: 34 meq/L — ABNORMAL HIGH (ref 19–32)
Calcium: 9.5 mg/dL (ref 8.4–10.5)
Chloride: 97 meq/L (ref 96–112)
Creatinine, Ser: 1.27 mg/dL (ref 0.40–1.50)
GFR: 56.5 mL/min — ABNORMAL LOW (ref >60.00)
Glucose, Bld: 93 mg/dL (ref 70–99)
Potassium: 3.3 meq/L — ABNORMAL LOW (ref 3.5–5.1)
Sodium: 141 meq/L (ref 135–145)

## 2023-11-23 LAB — BRAIN NATRIURETIC PEPTIDE: Pro B Natriuretic peptide (BNP): 168 pg/mL — ABNORMAL HIGH (ref 0.0–100.0)

## 2023-11-23 MED ORDER — CEPHALEXIN 500 MG PO CAPS: 500.0000 mg | ORAL_CAPSULE | Freq: Two times a day (BID) | ORAL | 0 refills | Status: AC

## 2023-11-23 MED ORDER — POTASSIUM CHLORIDE CRYS ER 20 MEQ PO TBCR: 20.0000 meq | EXTENDED_RELEASE_TABLET | Freq: Every day | ORAL | 0 refills | Status: AC

## 2023-11-23 MED ORDER — FUROSEMIDE 20 MG PO TABS: 20.0000 mg | ORAL_TABLET | Freq: Every day | ORAL | 0 refills | Status: DC

## 2023-11-23 NOTE — Patient Instructions (Addendum)
 Swelling in both legs with fluid drainage from the left leg, possibly due to limited mobility and dietary salt intake. There is a concern for infection. -We will perform blood tests to check for infection and kidney function. -Elevate your legs and reduce your salt intake. -We are prescribing furosemide 20 mg. -Advised to get evaluated in ED if symptoms worsen

## 2023-11-23 NOTE — Progress Notes (Signed)
 Established Patient Office Visit  Subjective:  Patient ID: Charles Mccann, male    DOB: 1951-01-08  Age: 73 y.o. MRN: [id number]  CC:  Chief Complaint  Patient presents with   Acute Visit    Bilateral foot and ankle swelling x 1 week Red spots all over stomach & back x 2 days  Fever for a few days Fluid leaking out of left leg   Discussed the use of a AI scribe software for clinical note transcription with the patient, who gave verbal consent to proceed.  HPI  Charles Mccann is a 73 year old male who presents with bilateral leg swelling and fever.  He has experienced bilateral ankle and leg swelling for about a week and  clear fluid drainage where he stumped himself  on the left leg. Elevating his legs, wearing compression socks, and using ice have somewhat reduced the swelling.  Fever occurred on Tuesday and Wednesday nights, with temperatures reaching 101.63F and 100.63F, respectively, resolving with Tylenol  by morning. He feels fatigued and slightly hazy.  He has asthma and experiences some shortness of breath, which he attributes to asthma. He recently completed doxycycline  and a prednisone  taper for a persistent cough and congestion, with improvement noted.  Non-itchy spots appeared on his stomach, chest, and back a couple of days ago. He denies new creams or detergents and has limited sun exposure.  He has a history of a broken fifth metatarsal, with swelling beginning after injuring his foot. He uses a boot for the fracture and has difficulty walking, which may contribute to the swelling.  He has a history of hives and was advised that hydrochlorothiazide  could cause allergic reactions. His cardiologist switched him to telmisartan, but he resumed hydrochlorothiazide  to manage the swelling. He reports a swollen lip, suspecting an allergic reaction to hydrochlorothiazide .  He is followed by cardiology. Echo on 05/17/23 - The left ventricular systolic function is  normal, LVEF is visually  estimated at 60-65%.   No change in weight  Wt Readings from Last 3 Encounters:  11/27/23 205 lb 9.6 oz (93.3 kg)  11/23/23 210 lb (95.3 kg)  10/26/23 210 lb (95.3 kg)    Past Medical History:  Diagnosis Date   Acute stroke due to ischemia (HCC) 10/05/2023   Allergy since birth   Arthritis    hands, shoulder, knees   Asthma    managed by [facility]   Blood transfusion without reported diagnosis during heart surgery 1974   Bronchitis, chronic obstructive, with exacerbation (HCC) 04/26/2014   Cataract    both eyes corrected by surgery   COPD (chronic obstructive pulmonary disease) (HCC)    GERD (gastroesophageal reflux disease)    controlled only with nexium priro prevacid failutre   Heart murmur Since birth   Hypercholesterolemia    Hyperlipidemia with target LDL less than 100 05/28/2011   Hypertension    Impotence due to erectile dysfunction 04/26/2014   Left inguinal hernia 04/07/2016   Obstructive sleep apnea of adult 2012   on CPAP  tolerating ,  12 cm H20 , room air    Palpitations 11/27/2011   Sleep apnea CPAP since 2012   Tetralogy of Fallot    Thoracic aortic aneurysm    Treadmill stress test negative for angina pectoris 2005   Umbilical hernia without obstruction and without gangrene 03/10/2016    Past Surgical History:  Procedure Laterality Date   BRAIN SURGERY  before 5th birthday   CARDIAC CATHETERIZATION  CATARACT EXTRACTION W/PHACO Right 11/16/2014   Procedure: CATARACT EXTRACTION PHACO AND INTRAOCULAR LENS PLACEMENT (IOC);  Surgeon: Steven Dingeldein, MD;  Location: [facility];  Service: Ophthalmology;  Laterality: Right;  Onu#[id number] H US :01:03.9 AP%:24.3% CDE:30.12   CATARACT EXTRACTION W/PHACO Left 02/21/2017   Procedure: CATARACT EXTRACTION PHACO AND INTRAOCULAR LENS PLACEMENT (IOC) LEFT;  Surgeon: Mittie Gaskin, MD;  Location: [facility];  Service: Ophthalmology;  Laterality: Left;  requests later    COLONOSCOPY WITH PROPOFOL  N/A 06/17/2019   Procedure: COLONOSCOPY WITH PROPOFOL ;  Surgeon: Jinny Carmine, MD;  Location: [facility];  Service: Endoscopy;  Laterality: N/A;   EYE SURGERY  cataract - 2016, 2019   HERNIA REPAIR  triple hernia - 2018   INGUINAL HERNIA REPAIR Left 04/20/2016   Procedure: HERNIA REPAIR INGUINAL ADULT WITH MESH;  Surgeon: Carlin Pastel, MD;  Location: [facility];  Service: General;  Laterality: Left;   JOINT REPLACEMENT  03/01/20, 02/14/21   left knee, right knee replacement   TETRALOGY OF FALLOT REPAIR  1974   [facility] [place]   TONSILLECTOMY     UMBILICAL HERNIA REPAIR N/A 04/20/2016   Procedure: HERNIA REPAIR UMBILICAL ADULT WITH MESH ;  Surgeon: Carlin Pastel, MD;  Location: [facility];  Service: General;  Laterality: N/A;    Family History  Problem Relation Age of Onset   Early death Mother    Heart disease Father 70       CAD   Arthritis Father    Cancer Brother 52       liver CA mets to brain    Cancer Brother        colon Ca , stomach CA   Cancer Brother    Cancer Brother     Social History   Socioeconomic History   Marital status: Married    Spouse name: Not on file   Number of children: Not on file   Years of education: Not on file   Highest education level: Master's degree (e.g., MA, MS, MEng, MEd, MSW, MBA)  Occupational History   Occupation: retired research scientist (medical)     Comment: travels  to [place] weekly   Tobacco Use   Smoking status: Never   Smokeless tobacco: Never  Vaping Use   Vaping status: Never Used  Substance and Sexual Activity   Alcohol use: Yes    Alcohol/week: 18.0 standard drinks of alcohol    Types: 7 Glasses of wine, 7 Cans of beer, 4 Shots of liquor per week    Comment:     Drug use: No   Sexual activity: Not Currently  Other Topics Concern   Not on file  Social History Narrative   Married   Social Drivers of Health   Financial Resource Strain: Low Risk  (11/22/2023)   Overall Financial Resource Strain  (CARDIA)    Difficulty of Paying Living Expenses: Not hard at all  Food Insecurity: No Food Insecurity (11/22/2023)   Hunger Vital Sign    Worried About Running Out of Food in the Last Year: Never true    Ran Out of Food in the Last Year: Never true  Transportation Needs: No Transportation Needs (11/22/2023)   PRAPARE - Administrator, Civil Service (Medical): No    Lack of Transportation (Non-Medical): No  Physical Activity: Sufficiently Active (11/22/2023)   Exercise Vital Sign    Days of Exercise per Week: 3 days    Minutes of Exercise per Session: 90 min  Stress: No Stress Concern Present (11/22/2023)  [employer] of Occupational Health - Occupational Stress Questionnaire    Feeling of Stress: Not at all  Social Connections: Moderately Integrated (11/22/2023)   Social Connection and Isolation Panel    Frequency of Communication with Friends and Family: Twice a week    Frequency of Social Gatherings with Friends and Family: Three times a week    Attends Religious Services: Patient declined    Active Member of Clubs or Organizations: Yes    Attends Banker Meetings: More than 4 times per year    Marital Status: Married  Catering Manager Violence: Not At Risk (10/26/2023)   Humiliation, Afraid, Rape, and Kick questionnaire    Fear of Current or Ex-Partner: No    Emotionally Abused: No    Physically Abused: No    Sexually Abused: No     Outpatient Medications Prior to Visit  Medication Sig Dispense Refill   albuterol  (PROVENTIL  HFA;VENTOLIN  HFA) 108 (90 BASE) MCG/ACT inhaler Inhale 2 puffs into the lungs every 6 (six) hours as needed.     aspirin  EC 81 MG tablet Take 1 tablet (81 mg total) by mouth daily. Swallow whole. 30 tablet 12   atorvastatin  (LIPITOR) 20 MG tablet TAKE 1 TABLET BY MOUTH DAILY. 90 tablet 1   atorvastatin  (LIPITOR) 40 MG tablet Take 1 tablet (40 mg total) by mouth daily. 30 tablet 5   azelastine  (ASTELIN ) 0.1 % nasal spray  Place 1 spray into the nose daily.     budesonide -formoterol (SYMBICORT) 160-4.5 MCG/ACT inhaler Inhale 2 puffs into the lungs 2 (two) times daily.     celecoxib  (CELEBREX ) 200 MG capsule Take 200 mg by mouth daily.     cetirizine (ZYRTEC) 10 MG tablet Take 10 mg by mouth daily.     Cholecalciferol (VITAMIN D ) 2000 units CAPS Take 2,000 Units by mouth every morning.      cyanocobalamin (VITAMIN B12) 1000 MCG/ML injection Inject 1,000 mcg into the muscle once a week. once a week for 4 weeks then once a month for 4 months (Patient taking differently: Inject 1,000 mcg into the muscle every 30 (thirty) days. once a week for 4 weeks then once a month for 4 months)     diphenhydrAMINE  (BENADRYL ) 25 mg capsule Take 25 mg by mouth at bedtime.     doxycycline  (VIBRAMYCIN ) 100 MG capsule Take 100 mg by mouth 2 (two) times daily.     famotidine  (PEPCID ) 20 MG tablet Take 1 tablet (20 mg total) by mouth 2 (two) times daily. 180 tablet 3   fluticasone  (FLONASE ) 50 MCG/ACT nasal spray Place 2 sprays into both nostrils at bedtime.      hydrochlorothiazide  (HYDRODIURIL ) 25 MG tablet Take 1 tablet (25 mg total) by mouth daily. 90 tablet 3   levalbuterol  (XOPENEX ) 0.63 MG/3ML nebulizer solution Take 0.63 mg by nebulization every 8 (eight) hours as needed for wheezing.     metoprolol  succinate (TOPROL -XL) 50 MG 24 hr tablet Take 50 mg by mouth daily.     montelukast  (SINGULAIR ) 10 MG tablet Take 1 tablet (10 mg total) by mouth at bedtime. 90 tablet 3   omeprazole (PRILOSEC) 40 MG capsule Take 40 mg by mouth daily.     predniSONE  (DELTASONE ) 5 MG tablet Take 5 mg by mouth every evening.     tirzepatide  (MOUNJARO ) 10 MG/0.5ML Pen Inject 10 mg into the skin once a week. 6 mL 2   Potassium 99 MG TABS Take 2 tablets by mouth daily. (Patient taking differently: Take 1  tablet by mouth daily.)     No facility-administered medications prior to visit.    Allergies  Allergen Reactions   Other Swelling, Anaphylaxis and  Shortness Of Breath    F[place] - Throat Swells Fin Fish   Fish Allergy Swelling    Finfish - Throat Swells (PT HAS NO PROBLEMS WITH SHELLFISH)    ROS Review of Systems Negative unless indicated in HPI.    Objective:    Physical Exam Constitutional:      Appearance: Normal appearance.  HENT:     Mouth/Throat:     Mouth: Mucous membranes are moist.  Eyes:     Conjunctiva/sclera: Conjunctivae normal.     Pupils: Pupils are equal, round, and reactive to light.  Cardiovascular:     Rate and Rhythm: Normal rate and regular rhythm.     Pulses: Normal pulses.     Heart sounds: Normal heart sounds.  Pulmonary:     Effort: Pulmonary effort is normal.     Breath sounds: Normal breath sounds.  Musculoskeletal:     Cervical back: Normal range of motion. No tenderness.     Right lower leg: Edema present.     Left lower leg: Edema present.  Skin:    General: Skin is warm.     Findings: Lesion present. No bruising.     Comments: Warmth and discharge from left lower leg.  Neurological:     General: No focal deficit present.     Mental Status: He is alert and oriented to person, place, and time. Mental status is at baseline.  Psychiatric:        Mood and Affect: Mood normal.        Behavior: Behavior normal.        Thought Content: Thought content normal.        Judgment: Judgment normal.     BP 114/74   Pulse 71   Temp 97.7 F (36.5 C)   Ht 6' (1.829 m)   Wt 210 lb (95.3 kg)   SpO2 97%   BMI 28.48 kg/m  Wt Readings from Last 3 Encounters:  11/27/23 205 lb 9.6 oz (93.3 kg)  11/23/23 210 lb (95.3 kg)  10/26/23 210 lb (95.3 kg)     Health Maintenance  Topic Date Due   OPHTHALMOLOGY EXAM  02/12/2024   FOOT EXAM  02/14/2024   HEMOGLOBIN A1C  02/21/2024   COVID-19 Vaccine (7 - Moderna risk 2025-26 season) 04/20/2024   Colonoscopy  06/16/2024   Diabetic kidney evaluation - Urine ACR  08/20/2024   Medicare Annual Wellness (AWV)  10/25/2024   Diabetic kidney evaluation  - eGFR measurement  11/26/2024   DTaP/Tdap/Td (3 - Td or Tdap) 02/14/2033   Pneumococcal Vaccine: 50+ Years  Completed   Influenza Vaccine  Completed   Hepatitis C Screening  Completed   Zoster Vaccines- Shingrix  Completed   Meningococcal B Vaccine  Aged Out    There are no preventive care reminders to display for this patient.  Lab Results  Component Value Date   TSH 3.11 02/14/2023   Lab Results  Component Value Date   WBC 7.6 11/23/2023   HGB 13.6 11/23/2023   HCT 41.6 11/23/2023   MCV 92.3 11/23/2023   PLT 201.0 11/23/2023   Lab Results  Component Value Date   NA 143 11/27/2023   K 3.8 11/27/2023   CO2 29 11/27/2023   GLUCOSE 99 11/27/2023   BUN 22 11/27/2023   CREATININE 1.33 11/27/2023   BILITOT  1.2 11/23/2023   ALKPHOS 57 11/23/2023   AST 19 11/23/2023   ALT 26 11/23/2023   PROT 6.4 11/23/2023   ALBUMIN 3.7 11/23/2023   CALCIUM  8.5 11/27/2023   ANIONGAP 11 10/06/2023   GFR 53.45 (L) 11/27/2023   Lab Results  Component Value Date   CHOL 116 10/06/2023   Lab Results  Component Value Date   HDL 34 (L) 10/06/2023   Lab Results  Component Value Date   LDLCALC 70 10/06/2023   Lab Results  Component Value Date   TRIG 60 10/06/2023   Lab Results  Component Value Date   CHOLHDL 3.4 10/06/2023   Lab Results  Component Value Date   HGBA1C 6.1 08/21/2023      Assessment & Plan:  Swelling of lower extremity Assessment & Plan: Bilateral lower extremity swelling.  No shortness of breath or chest pain.  Weight stable. -Will check labs as outlined. -Chest x-ray ordered -Will start on furosemide 20 mg daily. -Advised to keep the feet elevated and use compression stockings.   Orders: -     Brain natriuretic peptide -     DG Chest 2 View -     Basic metabolic panel with GFR -     Hepatic function panel  Fever, unspecified fever cause Assessment & Plan: POCT flu and COVID-negative. - Discussed symptomatic care.  Orders: -     POCT Influenza  A/B -     POC COVID-19 BinaxNow -     DG Chest 2 View -     CBC with Differential/Platelet  Skin infection Assessment & Plan: Erythema and warmth with drainage from the left lower extremity. - Will start on cephalexin 500 mg twice a day for 10 days.   Other orders -     Cephalexin; Take 1 capsule (500 mg total) by mouth 2 (two) times daily for 10 days.  Dispense: 20 capsule; Refill: 0 -     Furosemide; Take 1 tablet (20 mg total) by mouth daily.  Dispense: 20 tablet; Refill: 0    Follow-up: Return in about 4 days (around 11/27/2023) for bilaterall leg swelling.   Toinette Lackie, NP

## 2023-11-23 NOTE — Progress Notes (Signed)
 Called and discussed result with patient chronic atelectasis, no acute findings.

## 2023-11-27 ENCOUNTER — Ambulatory Visit: Admitting: Nurse Practitioner

## 2023-11-27 ENCOUNTER — Encounter: Payer: Self-pay | Admitting: Nurse Practitioner

## 2023-11-27 VITALS — BP 124/82 | HR 56 | Temp 98.3°F | Ht 72.0 in | Wt 205.6 lb

## 2023-11-27 DIAGNOSIS — E876 Hypokalemia: Secondary | ICD-10-CM | POA: Diagnosis not present

## 2023-11-27 DIAGNOSIS — S92355D Nondisplaced fracture of fifth metatarsal bone, left foot, subsequent encounter for fracture with routine healing: Secondary | ICD-10-CM | POA: Diagnosis not present

## 2023-11-27 DIAGNOSIS — M7989 Other specified soft tissue disorders: Secondary | ICD-10-CM

## 2023-11-27 DIAGNOSIS — M79672 Pain in left foot: Secondary | ICD-10-CM | POA: Diagnosis not present

## 2023-11-27 NOTE — Assessment & Plan Note (Addendum)
 Significant improvement with diuretic therapy. - Continue diuretic therapy. - Monitor for recurrence of swelling after stopping diuretic. - Contact clinic if swelling returns after stopping diuretic.

## 2023-11-27 NOTE — Progress Notes (Signed)
 Established Patient Office Visit  Subjective:  Patient ID: Charles Mccann, male    DOB: 1951/02/06  Age: 73 y.o. MRN: [id number]  CC:  Chief Complaint  Patient presents with   Acute Visit    Follow up on bilateral leg swelling Bilateral leg swelling has decreased   Discussed the use of a AI scribe software for clinical note transcription with the patient, who gave verbal consent to proceed.  HPI  Charles Mccann presents for follow up on lower extremity swelling.   He has experienced a decrease in leg swelling since his last visit, with significant improvement noted since Friday. He is on an antibiotic for a wound on his R leg. He has a history of cellulitis in the same leg, which has circulatory problems and heals slowly.  He has no concerns today.   HPI   Past Medical History:  Diagnosis Date   Acute stroke due to ischemia (HCC) 10/05/2023   Allergy since birth   Arthritis    hands, shoulder, knees   Asthma    managed by [facility]   Blood transfusion without reported diagnosis during heart surgery 1974   Bronchitis, chronic obstructive, with exacerbation (HCC) 04/26/2014   Cataract    both eyes corrected by surgery   COPD (chronic obstructive pulmonary disease) (HCC)    GERD (gastroesophageal reflux disease)    controlled only with nexium priro prevacid failutre   Heart murmur Since birth   Hypercholesterolemia    Hyperlipidemia with target LDL less than 100 05/28/2011   Hypertension    Impotence due to erectile dysfunction 04/26/2014   Left inguinal hernia 04/07/2016   Obstructive sleep apnea of adult 2012   on CPAP  tolerating ,  12 cm H20 , room air    Palpitations 11/27/2011   Sleep apnea CPAP since 2012   Tetralogy of Fallot    Thoracic aortic aneurysm    Treadmill stress test negative for angina pectoris 2005   Umbilical hernia without obstruction and without gangrene 03/10/2016    Past Surgical History:  Procedure Laterality Date   BRAIN SURGERY   before 5th birthday   CARDIAC CATHETERIZATION     CATARACT EXTRACTION W/PHACO Right 11/16/2014   Procedure: CATARACT EXTRACTION PHACO AND INTRAOCULAR LENS PLACEMENT (IOC);  Surgeon: Steven Dingeldein, MD;  Location: [facility];  Service: Ophthalmology;  Laterality: Right;  Onu#[id number] H US :01:03.9 AP%:24.3% CDE:30.12   CATARACT EXTRACTION W/PHACO Left 02/21/2017   Procedure: CATARACT EXTRACTION PHACO AND INTRAOCULAR LENS PLACEMENT (IOC) LEFT;  Surgeon: Mittie Gaskin, MD;  Location: [facility];  Service: Ophthalmology;  Laterality: Left;  requests later   COLONOSCOPY WITH PROPOFOL  N/A 06/17/2019   Procedure: COLONOSCOPY WITH PROPOFOL ;  Surgeon: Jinny Carmine, MD;  Location: [facility];  Service: Endoscopy;  Laterality: N/A;   EYE SURGERY  cataract - 2016, 2019   HERNIA REPAIR  triple hernia - 2018   INGUINAL HERNIA REPAIR Left 04/20/2016   Procedure: HERNIA REPAIR INGUINAL ADULT WITH MESH;  Surgeon: Carlin Pastel, MD;  Location: [facility];  Service: General;  Laterality: Left;   JOINT REPLACEMENT  03/01/20, 02/14/21   left knee, right knee replacement   TETRALOGY OF FALLOT REPAIR  1974   [facility] [place]   TONSILLECTOMY     UMBILICAL HERNIA REPAIR N/A 04/20/2016   Procedure: HERNIA REPAIR UMBILICAL ADULT WITH MESH ;  Surgeon: Carlin Pastel, MD;  Location: [facility];  Service: General;  Laterality: N/A;    Family History  Problem Relation Age  of Onset   Early death Mother    Heart disease Father 75       CAD   Arthritis Father    Cancer Brother 30       liver CA mets to brain    Cancer Brother        colon Ca , stomach CA   Cancer Brother    Cancer Brother     Social History   Socioeconomic History   Marital status: Married    Spouse name: Not on file   Number of children: Not on file   Years of education: Not on file   Highest education level: Master's degree (e.g., MA, MS, MEng, MEd, MSW, MBA)  Occupational History   Occupation: retired Research scientist (medical)      Comment: travels  to [place] weekly   Tobacco Use   Smoking status: Never   Smokeless tobacco: Never  Vaping Use   Vaping status: Never Used  Substance and Sexual Activity   Alcohol use: Yes    Alcohol/week: 18.0 standard drinks of alcohol    Types: 7 Glasses of wine, 7 Cans of beer, 4 Shots of liquor per week    Comment:     Drug use: No   Sexual activity: Not Currently  Other Topics Concern   Not on file  Social History Narrative   Married   Social Drivers of Health   Financial Resource Strain: Low Risk  (11/22/2023)   Overall Financial Resource Strain (CARDIA)    Difficulty of Paying Living Expenses: Not hard at all  Food Insecurity: No Food Insecurity (11/22/2023)   Hunger Vital Sign    Worried About Running Out of Food in the Last Year: Never true    Ran Out of Food in the Last Year: Never true  Transportation Needs: No Transportation Needs (11/22/2023)   PRAPARE - Administrator, Civil Service (Medical): No    Lack of Transportation (Non-Medical): No  Physical Activity: Sufficiently Active (11/22/2023)   Exercise Vital Sign    Days of Exercise per Week: 3 days    Minutes of Exercise per Session: 90 min  Stress: No Stress Concern Present (11/22/2023)   [employer] of Occupational Health - Occupational Stress Questionnaire    Feeling of Stress: Not at all  Social Connections: Moderately Integrated (11/22/2023)   Social Connection and Isolation Panel    Frequency of Communication with Friends and Family: Twice a week    Frequency of Social Gatherings with Friends and Family: Three times a week    Attends Religious Services: Patient declined    Active Member of Clubs or Organizations: Yes    Attends Banker Meetings: More than 4 times per year    Marital Status: Married  Catering manager Violence: Not At Risk (10/26/2023)   Humiliation, Afraid, Rape, and Kick questionnaire    Fear of Current or Ex-Partner: No    Emotionally Abused: No     Physically Abused: No    Sexually Abused: No     Outpatient Medications Prior to Visit  Medication Sig Dispense Refill   albuterol  (PROVENTIL  HFA;VENTOLIN  HFA) 108 (90 BASE) MCG/ACT inhaler Inhale 2 puffs into the lungs every 6 (six) hours as needed.     aspirin  EC 81 MG tablet Take 1 tablet (81 mg total) by mouth daily. Swallow whole. 30 tablet 12   atorvastatin  (LIPITOR) 20 MG tablet TAKE 1 TABLET BY MOUTH DAILY. 90 tablet 1   atorvastatin  (LIPITOR) 40 MG tablet Take  1 tablet (40 mg total) by mouth daily. 30 tablet 5   azelastine  (ASTELIN ) 0.1 % nasal spray Place 1 spray into the nose daily.     budesonide -formoterol (SYMBICORT) 160-4.5 MCG/ACT inhaler Inhale 2 puffs into the lungs 2 (two) times daily.     celecoxib  (CELEBREX ) 200 MG capsule Take 200 mg by mouth daily.     cephALEXin (KEFLEX) 500 MG capsule Take 1 capsule (500 mg total) by mouth 2 (two) times daily for 10 days. 20 capsule 0   cetirizine (ZYRTEC) 10 MG tablet Take 10 mg by mouth daily.     Cholecalciferol (VITAMIN D ) 2000 units CAPS Take 2,000 Units by mouth every morning.      cyanocobalamin (VITAMIN B12) 1000 MCG/ML injection Inject 1,000 mcg into the muscle once a week. once a week for 4 weeks then once a month for 4 months (Patient taking differently: Inject 1,000 mcg into the muscle every 30 (thirty) days. once a week for 4 weeks then once a month for 4 months)     diphenhydrAMINE  (BENADRYL ) 25 mg capsule Take 25 mg by mouth at bedtime.     doxycycline  (VIBRAMYCIN ) 100 MG capsule Take 100 mg by mouth 2 (two) times daily.     famotidine  (PEPCID ) 20 MG tablet Take 1 tablet (20 mg total) by mouth 2 (two) times daily. 180 tablet 3   fluticasone  (FLONASE ) 50 MCG/ACT nasal spray Place 2 sprays into both nostrils at bedtime.      furosemide (LASIX) 20 MG tablet Take 1 tablet (20 mg total) by mouth daily. 20 tablet 0   hydrochlorothiazide  (HYDRODIURIL ) 25 MG tablet Take 1 tablet (25 mg total) by mouth daily. 90 tablet 3    levalbuterol  (XOPENEX ) 0.63 MG/3ML nebulizer solution Take 0.63 mg by nebulization every 8 (eight) hours as needed for wheezing.     metoprolol  succinate (TOPROL -XL) 50 MG 24 hr tablet Take 50 mg by mouth daily.     montelukast  (SINGULAIR ) 10 MG tablet Take 1 tablet (10 mg total) by mouth at bedtime. 90 tablet 3   omeprazole (PRILOSEC) 40 MG capsule Take 40 mg by mouth daily.     potassium chloride SA (KLOR-CON M) 20 MEQ tablet Take 1 tablet (20 mEq total) by mouth daily. 30 tablet 0   predniSONE  (DELTASONE ) 5 MG tablet Take 5 mg by mouth every evening.     tirzepatide  (MOUNJARO ) 10 MG/0.5ML Pen Inject 10 mg into the skin once a week. 6 mL 2   No facility-administered medications prior to visit.    Allergies  Allergen Reactions   Other Swelling, Anaphylaxis and Shortness Of Breath    Finfish - Throat Swells Fin Fish   Fish Allergy Swelling    Finfish - Throat Swells (PT HAS NO PROBLEMS WITH SHELLFISH)    ROS Review of Systems Negative unless indicated in HPI.    Objective:    Physical Exam Constitutional:      Appearance: Normal appearance.  HENT:     Mouth/Throat:     Mouth: Mucous membranes are moist.  Eyes:     Conjunctiva/sclera: Conjunctivae normal.     Pupils: Pupils are equal, round, and reactive to light.  Cardiovascular:     Rate and Rhythm: Normal rate and regular rhythm.     Pulses: Normal pulses.     Heart sounds: Normal heart sounds.  Pulmonary:     Effort: Pulmonary effort is normal.     Breath sounds: Normal breath sounds.  Abdominal:  General: Bowel sounds are normal.     Palpations: Abdomen is soft.  Musculoskeletal:        General: Swelling present.     Cervical back: Normal range of motion. No tenderness.     Right lower leg: Edema present.     Left lower leg: Edema present.     Comments: RLE swelling > LLE  Skin:    General: Skin is warm.     Findings: No bruising.  Neurological:     General: No focal deficit present.     Mental Status:  He is alert and oriented to person, place, and time. Mental status is at baseline.  Psychiatric:        Mood and Affect: Mood normal.        Behavior: Behavior normal.        Thought Content: Thought content normal.        Judgment: Judgment normal.     BP 124/82   Pulse (!) 56   Temp 98.3 F (36.8 C)   Ht 6' (1.829 m)   Wt 205 lb 9.6 oz (93.3 kg)   SpO2 98%   BMI 27.88 kg/m  Wt Readings from Last 3 Encounters:  11/27/23 205 lb 9.6 oz (93.3 kg)  11/23/23 210 lb (95.3 kg)  10/26/23 210 lb (95.3 kg)     Health Maintenance  Topic Date Due   OPHTHALMOLOGY EXAM  02/12/2024   FOOT EXAM  02/14/2024   HEMOGLOBIN A1C  02/21/2024   COVID-19 Vaccine (7 - Moderna risk 2025-26 season) 04/20/2024   Colonoscopy  06/16/2024   Diabetic kidney evaluation - Urine ACR  08/20/2024   Medicare Annual Wellness (AWV)  10/25/2024   Diabetic kidney evaluation - eGFR measurement  11/22/2024   DTaP/Tdap/Td (3 - Td or Tdap) 02/14/2033   Pneumococcal Vaccine: 50+ Years  Completed   Influenza Vaccine  Completed   Hepatitis C Screening  Completed   Zoster Vaccines- Shingrix  Completed   Meningococcal B Vaccine  Aged Out    There are no preventive care reminders to display for this patient.  Lab Results  Component Value Date   TSH 3.11 02/14/2023   Lab Results  Component Value Date   WBC 7.6 11/23/2023   HGB 13.6 11/23/2023   HCT 41.6 11/23/2023   MCV 92.3 11/23/2023   PLT 201.0 11/23/2023   Lab Results  Component Value Date   NA 141 11/23/2023   K 3.3 (L) 11/23/2023   CO2 34 (H) 11/23/2023   GLUCOSE 93 11/23/2023   BUN 16 11/23/2023   CREATININE 1.27 11/23/2023   BILITOT 1.2 11/23/2023   ALKPHOS 57 11/23/2023   AST 19 11/23/2023   ALT 26 11/23/2023   PROT 6.4 11/23/2023   ALBUMIN 3.7 11/23/2023   CALCIUM  9.5 11/23/2023   ANIONGAP 11 10/06/2023   GFR 56.50 (L) 11/23/2023   Lab Results  Component Value Date   CHOL 116 10/06/2023   Lab Results  Component Value Date   HDL  34 (L) 10/06/2023   Lab Results  Component Value Date   LDLCALC 70 10/06/2023   Lab Results  Component Value Date   TRIG 60 10/06/2023   Lab Results  Component Value Date   CHOLHDL 3.4 10/06/2023   Lab Results  Component Value Date   HGBA1C 6.1 08/21/2023      Assessment & Plan:  Swelling of lower extremity Assessment & Plan: Significant improvement with diuretic therapy. - Continue diuretic therapy. - Monitor for recurrence of  swelling after stopping diuretic. - Contact clinic if swelling returns after stopping diuretic.  Orders: -     Basic metabolic panel with GFR  Hypokalemia Assessment & Plan: Previously treated with potassium supplementation. Monitoring needed due to diuretic use. Lab Results  Component Value Date   K 3.3 (L) 11/23/2023   - Order lab tests to check potassium levels. - Continue potassium supplementation until diuretic therapy is completed. - Discontinue potassium supplementation after stopping diuretic therapy and take OTC potassium supplement as before.  Orders: -     Basic metabolic panel with GFR    Follow-up: Return if symptoms worsen or fail to improve.   Haadiya Frogge, NP

## 2023-11-27 NOTE — Patient Instructions (Addendum)
 BILATERAL LOWER EXTREMITY EDEMA: You have swelling in both of your legs, which has improved with your current treatment. -Continue taking your diuretic medication for 20 more days. -Monitor your legs for any return of swelling after you stop the diuretic. -Contact our clinic if the swelling comes back after you stop the diuretic.  HYPOKALEMIA, RESOLVING:  -We will order lab tests to check your potassium levels. -Continue taking your potassium supplement until you finish your diuretic therapy. -You can stop taking the prescription potassium supplement after you finish the diuretic therapy and can continue the OTC that you were taking before.

## 2023-11-27 NOTE — Assessment & Plan Note (Signed)
 Previously treated with potassium supplementation. Monitoring needed due to diuretic use. Lab Results  Component Value Date   K 3.3 (L) 11/23/2023   - Order lab tests to check potassium levels. - Continue potassium supplementation until diuretic therapy is completed. - Discontinue potassium supplementation after stopping diuretic therapy and take OTC potassium supplement as before.

## 2023-11-28 LAB — BASIC METABOLIC PANEL WITH GFR
BUN: 22 mg/dL (ref 6–23)
CO2: 29 meq/L (ref 19–32)
Calcium: 8.5 mg/dL (ref 8.4–10.5)
Chloride: 102 meq/L (ref 96–112)
Creatinine, Ser: 1.33 mg/dL (ref 0.40–1.50)
GFR: 53.45 mL/min — ABNORMAL LOW (ref >60.00)
Glucose, Bld: 99 mg/dL (ref 70–99)
Potassium: 3.8 meq/L (ref 3.5–5.1)
Sodium: 143 meq/L (ref 135–145)

## 2023-11-29 ENCOUNTER — Encounter: Payer: Self-pay | Admitting: Nurse Practitioner

## 2023-11-29 ENCOUNTER — Ambulatory Visit: Payer: Self-pay | Admitting: Nurse Practitioner

## 2023-12-04 DIAGNOSIS — L089 Local infection of the skin and subcutaneous tissue, unspecified: Secondary | ICD-10-CM | POA: Insufficient documentation

## 2023-12-04 DIAGNOSIS — R509 Fever, unspecified: Secondary | ICD-10-CM | POA: Insufficient documentation

## 2023-12-04 NOTE — Assessment & Plan Note (Signed)
 Erythema and warmth with drainage from the left lower extremity. - Will start on cephalexin 500 mg twice a day for 10 days.

## 2023-12-04 NOTE — Telephone Encounter (Signed)
 Called patient regarding InBasket message about tachycardia and being placed on Furosemide 10 mg about 10 days ago. Patient stated his swelling has gone down and this morning his heart rate was 61. Encouraged him to drink plenty and add in some drinks with electrolytes in them. Patient stated he was trying to drink more but he thought that might be why his heart rate was high.

## 2023-12-04 NOTE — Assessment & Plan Note (Signed)
 Bilateral lower extremity swelling.  No shortness of breath or chest pain.  Weight stable. -Will check labs as outlined. -Chest x-ray ordered -Will start on furosemide 20 mg daily. -Advised to keep the feet elevated and use compression stockings.

## 2023-12-04 NOTE — Assessment & Plan Note (Signed)
 POCT flu and COVID-negative. - Discussed symptomatic care.

## 2023-12-11 ENCOUNTER — Other Ambulatory Visit: Payer: Self-pay | Admitting: Nurse Practitioner

## 2023-12-11 DIAGNOSIS — N289 Disorder of kidney and ureter, unspecified: Secondary | ICD-10-CM

## 2023-12-11 MED ORDER — TRAMADOL HCL 50 MG PO TABS: 50.0000 mg | ORAL_TABLET | Freq: Two times a day (BID) | ORAL | 1 refills | Status: DC | PRN

## 2023-12-11 NOTE — Progress Notes (Signed)
Controlled substance database reviewed.  Medication sent to pharmacy.

## 2023-12-12 DIAGNOSIS — R002 Palpitations: Secondary | ICD-10-CM | POA: Diagnosis not present

## 2023-12-12 DIAGNOSIS — R600 Localized edema: Secondary | ICD-10-CM | POA: Diagnosis not present

## 2023-12-18 ENCOUNTER — Other Ambulatory Visit: Payer: Self-pay | Admitting: Nurse Practitioner

## 2023-12-18 DIAGNOSIS — S92355D Nondisplaced fracture of fifth metatarsal bone, left foot, subsequent encounter for fracture with routine healing: Secondary | ICD-10-CM | POA: Diagnosis not present

## 2023-12-20 DIAGNOSIS — Z91013 Allergy to seafood: Secondary | ICD-10-CM | POA: Diagnosis not present

## 2023-12-20 DIAGNOSIS — J4530 Mild persistent asthma, uncomplicated: Secondary | ICD-10-CM | POA: Diagnosis not present

## 2023-12-20 DIAGNOSIS — L501 Idiopathic urticaria: Secondary | ICD-10-CM | POA: Diagnosis not present

## 2023-12-20 DIAGNOSIS — J3089 Other allergic rhinitis: Secondary | ICD-10-CM | POA: Diagnosis not present

## 2023-12-20 NOTE — Telephone Encounter (Signed)
 Please call pt to check if he still has swelling in the lower extremity.

## 2023-12-21 NOTE — Telephone Encounter (Signed)
 Please call and check if he still has swelling in the lower extremities. If he does not have swelling he does not need lasix.

## 2023-12-21 NOTE — Telephone Encounter (Signed)
 Called Patient no answer and voicemail stated Patient is not available at this time call back later.

## 2023-12-21 NOTE — Telephone Encounter (Signed)
 Noted! Thank you

## 2023-12-21 NOTE — Telephone Encounter (Signed)
 Patient states he does not need this medication due to the swelling is gone.

## 2023-12-26 DIAGNOSIS — R002 Palpitations: Secondary | ICD-10-CM | POA: Diagnosis not present

## 2023-12-26 DIAGNOSIS — R600 Localized edema: Secondary | ICD-10-CM | POA: Diagnosis not present

## 2023-12-28 ENCOUNTER — Other Ambulatory Visit: Payer: Self-pay | Admitting: Nurse Practitioner

## 2023-12-31 ENCOUNTER — Other Ambulatory Visit: Payer: Self-pay

## 2023-12-31 DIAGNOSIS — M7989 Other specified soft tissue disorders: Secondary | ICD-10-CM

## 2023-12-31 MED ORDER — FUROSEMIDE 20 MG PO TABS: 20.0000 mg | ORAL_TABLET | Freq: Every day | ORAL | 0 refills | Status: DC

## 2024-01-08 DIAGNOSIS — S92355G Nondisplaced fracture of fifth metatarsal bone, left foot, subsequent encounter for fracture with delayed healing: Secondary | ICD-10-CM | POA: Diagnosis not present

## 2024-01-09 ENCOUNTER — Telehealth: Payer: Self-pay

## 2024-01-09 ENCOUNTER — Other Ambulatory Visit: Payer: Self-pay

## 2024-01-09 DIAGNOSIS — Z860100 Personal history of colon polyps, unspecified: Secondary | ICD-10-CM

## 2024-01-09 MED ORDER — NA SULFATE-K SULFATE-MG SULF 17.5-3.13-1.6 GM/177ML PO SOLN: 354.0000 mL | Freq: Once | ORAL | 0 refills | Status: AC

## 2024-01-09 NOTE — Telephone Encounter (Signed)
 Gastroenterology Pre-Procedure Review  Request Date: 06/10/2024 Requesting Physician: Dr. Jinny  PATIENT REVIEW QUESTIONS: The patient responded to the following health history questions as indicated:    1. Are you having any GI issues? no 2. Do you have a personal history of Polyps? yes (06/17/2019 Dr. Birda) 3. Do you have a family history of Colon Cancer or Polyps? no 4. Diabetes Mellitus? yes (Mounjaro  hold for 7 days) 5. Joint replacements in the past 12 months?no 6. Major health problems in the past 3 months?no 7. Any artificial heart valves, MVP, or defibrillator?no but he had a tetralogy of fallott 1974 and thoracic aneurysm 04/25/2017. Cardiac clearance will be provided by Dr[facility] Brock at [facility]     MEDICATIONS & ALLERGIES:    Patient reports the following regarding taking any anticoagulation/antiplatelet therapy:   Plavix, Coumadin, Eliquis, Xarelto, Lovenox , Pradaxa, Brilinta, or Effient? no Aspirin ? yes (Aspirin  81 MG)  Patient confirms/reports the following medications:  Current Outpatient Medications  Medication Sig Dispense Refill   Na Sulfate-K Sulfate-Mg Sulfate concentrate (SUPREP) 17.5-3.13-1.6 GM/177ML SOLN Take 1 kit (354 mLs total) by mouth once for 1 dose. Starting at 5 PM take one bottle and pour into the supplied cup, add cool water to the fill 16 oz line and drink all. Then 5 hours before procedure pour the second bottle into the supplied cup, add cool water to the fill 16 oz line and drink all. 354 mL 0   albuterol  (PROVENTIL  HFA;VENTOLIN  HFA) 108 (90 BASE) MCG/ACT inhaler Inhale 2 puffs into the lungs every 6 (six) hours as needed.     aspirin  EC 81 MG tablet Take 1 tablet (81 mg total) by mouth daily. Swallow whole. 30 tablet 12   atorvastatin  (LIPITOR) 20 MG tablet TAKE 1 TABLET BY MOUTH DAILY. 90 tablet 1   atorvastatin  (LIPITOR) 40 MG tablet Take 1 tablet (40 mg total) by mouth daily. 30 tablet 5   azelastine  (ASTELIN ) 0.1 % nasal spray Place 1 spray into  the nose daily.     budesonide -formoterol (SYMBICORT) 160-4.5 MCG/ACT inhaler Inhale 2 puffs into the lungs 2 (two) times daily.     celecoxib  (CELEBREX ) 200 MG capsule Take 200 mg by mouth daily.     cetirizine (ZYRTEC) 10 MG tablet Take 10 mg by mouth daily.     Cholecalciferol (VITAMIN D ) 2000 units CAPS Take 2,000 Units by mouth every morning.      cyanocobalamin  (VITAMIN B12) 1000 MCG/ML injection Inject 1,000 mcg into the muscle once a week. once a week for 4 weeks then once a month for 4 months (Patient taking differently: Inject 1,000 mcg into the muscle every 30 (thirty) days. once a week for 4 weeks then once a month for 4 months)     diphenhydrAMINE  (BENADRYL ) 25 mg capsule Take 25 mg by mouth at bedtime.     doxycycline  (VIBRAMYCIN ) 100 MG capsule Take 100 mg by mouth 2 (two) times daily.     famotidine  (PEPCID ) 20 MG tablet Take 1 tablet (20 mg total) by mouth 2 (two) times daily. 180 tablet 3   fluticasone  (FLONASE ) 50 MCG/ACT nasal spray Place 2 sprays into both nostrils at bedtime.      furosemide  (LASIX ) 20 MG tablet Take 1 tablet (20 mg total) by mouth daily. 20 tablet 0   hydrochlorothiazide  (HYDRODIURIL ) 25 MG tablet Take 1 tablet (25 mg total) by mouth daily. 90 tablet 3   levalbuterol  (XOPENEX ) 0.63 MG/3ML nebulizer solution Take 0.63 mg by nebulization every 8 (eight) hours  as needed for wheezing.     metoprolol  succinate (TOPROL -XL) 50 MG 24 hr tablet Take 50 mg by mouth daily.     montelukast  (SINGULAIR ) 10 MG tablet Take 1 tablet (10 mg total) by mouth at bedtime. 90 tablet 3   omeprazole (PRILOSEC) 40 MG capsule Take 40 mg by mouth daily.     potassium chloride SA (KLOR-CON M) 20 MEQ tablet Take 1 tablet (20 mEq total) by mouth daily. 30 tablet 0   predniSONE  (DELTASONE ) 5 MG tablet Take 5 mg by mouth every evening.     tirzepatide  (MOUNJARO ) 10 MG/0.5ML Pen Inject 10 mg into the skin once a week. 6 mL 2   traMADol  (ULTRAM ) 50 MG tablet Take 1 tablet (50 mg total) by  mouth every 12 (twelve) hours as needed. 30 tablet 1   No current facility-administered medications for this visit.    Patient confirms/reports the following allergies:  Allergies  Allergen Reactions   Other Swelling, Anaphylaxis and Shortness Of Breath    Finfish - Throat Swells Fin Fish   Fish Allergy Swelling    Finfish - Throat Swells (PT HAS NO PROBLEMS WITH SHELLFISH)    No orders of the defined types were placed in this encounter.   AUTHORIZATION INFORMATION Primary Insurance: 1D#: Group #:  Secondary Insurance: 1D#: Group #:  SCHEDULE INFORMATION: Date: 06/10/2024 Time: Location: [facility] Dr. Jinny

## 2024-01-17 DIAGNOSIS — L821 Other seborrheic keratosis: Secondary | ICD-10-CM | POA: Diagnosis not present

## 2024-01-17 DIAGNOSIS — L814 Other melanin hyperpigmentation: Secondary | ICD-10-CM | POA: Diagnosis not present

## 2024-01-17 DIAGNOSIS — D492 Neoplasm of unspecified behavior of bone, soft tissue, and skin: Secondary | ICD-10-CM | POA: Diagnosis not present

## 2024-01-17 DIAGNOSIS — C44619 Basal cell carcinoma of skin of left upper limb, including shoulder: Secondary | ICD-10-CM | POA: Diagnosis not present

## 2024-01-17 DIAGNOSIS — S81809A Unspecified open wound, unspecified lower leg, initial encounter: Secondary | ICD-10-CM | POA: Diagnosis not present

## 2024-01-17 DIAGNOSIS — I872 Venous insufficiency (chronic) (peripheral): Secondary | ICD-10-CM | POA: Diagnosis not present

## 2024-01-17 DIAGNOSIS — L905 Scar conditions and fibrosis of skin: Secondary | ICD-10-CM | POA: Diagnosis not present

## 2024-01-17 DIAGNOSIS — L570 Actinic keratosis: Secondary | ICD-10-CM | POA: Diagnosis not present

## 2024-01-23 ENCOUNTER — Other Ambulatory Visit: Payer: Self-pay | Admitting: Internal Medicine

## 2024-01-24 ENCOUNTER — Encounter: Payer: Self-pay | Admitting: Internal Medicine

## 2024-01-25 MED ORDER — FAMOTIDINE 20 MG PO TABS: 20.0000 mg | ORAL_TABLET | Freq: Two times a day (BID) | ORAL | 3 refills | Status: DC

## 2024-02-11 ENCOUNTER — Telehealth: Payer: Self-pay

## 2024-02-11 NOTE — Telephone Encounter (Signed)
 Patient was able to obtain cardiac clearance for his procedure.. Patient is to take his metoprolol . Patient was advised.

## 2024-02-25 ENCOUNTER — Encounter: Payer: Self-pay | Admitting: Internal Medicine

## 2024-02-25 ENCOUNTER — Ambulatory Visit: Admitting: Internal Medicine

## 2024-02-25 ENCOUNTER — Ambulatory Visit
Admission: RE | Admit: 2024-02-25 | Discharge: 2024-02-25 | Disposition: A | Source: Ambulatory Visit | Attending: Internal Medicine | Admitting: Internal Medicine

## 2024-02-25 ENCOUNTER — Ambulatory Visit
Admission: RE | Admit: 2024-02-25 | Discharge: 2024-02-25 | Disposition: A | Discharge status: Home / Self Care | Attending: Internal Medicine | Admitting: Internal Medicine

## 2024-02-25 VITALS — BP 120/82 | HR 73 | Ht 72.0 in | Wt 215.2 lb

## 2024-02-25 DIAGNOSIS — R809 Proteinuria, unspecified: Secondary | ICD-10-CM | POA: Diagnosis not present

## 2024-02-25 DIAGNOSIS — R053 Chronic cough: Secondary | ICD-10-CM | POA: Diagnosis not present

## 2024-02-25 DIAGNOSIS — L509 Urticaria, unspecified: Secondary | ICD-10-CM | POA: Diagnosis not present

## 2024-02-25 DIAGNOSIS — G4733 Obstructive sleep apnea (adult) (pediatric): Secondary | ICD-10-CM

## 2024-02-25 DIAGNOSIS — D126 Benign neoplasm of colon, unspecified: Secondary | ICD-10-CM | POA: Diagnosis present

## 2024-02-25 DIAGNOSIS — Z79899 Other long term (current) drug therapy: Secondary | ICD-10-CM

## 2024-02-25 DIAGNOSIS — Z125 Encounter for screening for malignant neoplasm of prostate: Secondary | ICD-10-CM

## 2024-02-25 DIAGNOSIS — E785 Hyperlipidemia, unspecified: Secondary | ICD-10-CM | POA: Diagnosis not present

## 2024-02-25 DIAGNOSIS — Z0000 Encounter for general adult medical examination without abnormal findings: Secondary | ICD-10-CM | POA: Diagnosis not present

## 2024-02-25 DIAGNOSIS — Z8673 Personal history of transient ischemic attack (TIA), and cerebral infarction without residual deficits: Secondary | ICD-10-CM

## 2024-02-25 DIAGNOSIS — E538 Deficiency of other specified B group vitamins: Secondary | ICD-10-CM

## 2024-02-25 DIAGNOSIS — I10 Essential (primary) hypertension: Secondary | ICD-10-CM

## 2024-02-25 DIAGNOSIS — E1129 Type 2 diabetes mellitus with other diabetic kidney complication: Secondary | ICD-10-CM | POA: Diagnosis not present

## 2024-02-25 DIAGNOSIS — M5431 Sciatica, right side: Secondary | ICD-10-CM | POA: Insufficient documentation

## 2024-02-25 DIAGNOSIS — E669 Obesity, unspecified: Secondary | ICD-10-CM | POA: Diagnosis not present

## 2024-02-25 DIAGNOSIS — E119 Type 2 diabetes mellitus without complications: Secondary | ICD-10-CM

## 2024-02-25 DIAGNOSIS — Z0001 Encounter for general adult medical examination with abnormal findings: Secondary | ICD-10-CM

## 2024-02-25 LAB — CBC WITH DIFFERENTIAL/PLATELET
Basophils Absolute: 0.1 K/uL (ref 0.0–0.1)
Basophils Relative: 0.8 % (ref 0.0–3.0)
Eosinophils Absolute: 0.4 K/uL (ref 0.0–0.7)
Eosinophils Relative: 4.6 % (ref 0.0–5.0)
HCT: 37.2 % — ABNORMAL LOW (ref 39.0–52.0)
Hemoglobin: 12.4 g/dL — ABNORMAL LOW (ref 13.0–17.0)
Lymphocytes Relative: 20.3 % (ref 12.0–46.0)
Lymphs Abs: 1.5 K/uL (ref 0.7–4.0)
MCHC: 33.3 g/dL (ref 30.0–36.0)
MCV: 91.2 fl (ref 78.0–100.0)
Monocytes Absolute: 0.6 K/uL (ref 0.1–1.0)
Monocytes Relative: 7.8 % (ref 3.0–12.0)
Neutro Abs: 5 K/uL (ref 1.4–7.7)
Neutrophils Relative %: 66.5 % (ref 43.0–77.0)
Platelets: 193 K/uL (ref 150.0–400.0)
RBC: 4.08 Mil/uL — ABNORMAL LOW (ref 4.22–5.81)
RDW: 15 % (ref 11.5–15.5)
WBC: 7.5 K/uL (ref 4.0–10.5)

## 2024-02-25 LAB — COMPREHENSIVE METABOLIC PANEL WITH GFR
ALT: 15 U/L (ref 3–53)
AST: 14 U/L (ref 5–37)
Albumin: 3.3 g/dL — ABNORMAL LOW (ref 3.5–5.2)
Alkaline Phosphatase: 74 U/L (ref 39–117)
BUN: 19 mg/dL (ref 6–23)
CO2: 30 meq/L (ref 19–32)
Calcium: 8.4 mg/dL (ref 8.4–10.5)
Chloride: 105 meq/L (ref 96–112)
Creatinine, Ser: 1.16 mg/dL (ref 0.40–1.50)
GFR: 62.87 mL/min
Glucose, Bld: 93 mg/dL (ref 70–99)
Potassium: 3.8 meq/L (ref 3.5–5.1)
Sodium: 142 meq/L (ref 135–145)
Total Bilirubin: 1 mg/dL (ref 0.2–1.2)
Total Protein: 5.5 g/dL — ABNORMAL LOW (ref 6.0–8.3)

## 2024-02-25 LAB — LIPID PANEL
Cholesterol: 97 mg/dL (ref 28–200)
HDL: 34.6 mg/dL — ABNORMAL LOW
LDL Cholesterol: 47 mg/dL (ref 10–99)
NonHDL: 62.79
Total CHOL/HDL Ratio: 3
Triglycerides: 78 mg/dL (ref 10.0–149.0)
VLDL: 15.6 mg/dL (ref 0.0–40.0)

## 2024-02-25 LAB — B12 AND FOLATE PANEL
Folate: 10.4 ng/mL
Vitamin B-12: >1500 pg/mL — ABNORMAL HIGH (ref 211–911)

## 2024-02-25 LAB — MICROALBUMIN / CREATININE URINE RATIO
Creatinine,U: 120.6 mg/dL
Microalb Creat Ratio: 39.2 mg/g — ABNORMAL HIGH (ref 0.0–30.0)
Microalb, Ur: 4.7 mg/dL — ABNORMAL HIGH (ref 0.7–1.9)

## 2024-02-25 LAB — LDL CHOLESTEROL, DIRECT: Direct LDL: 51 mg/dL

## 2024-02-25 LAB — HEMOGLOBIN A1C: Hgb A1c MFr Bld: 5.8 % (ref 4.6–6.5)

## 2024-02-25 LAB — TSH: TSH: 2.79 u[IU]/mL (ref 0.35–5.50)

## 2024-02-25 LAB — PSA, MEDICARE: PSA: 0.88 ng/mL (ref 0.10–4.00)

## 2024-02-25 MED ORDER — ATORVASTATIN CALCIUM 40 MG PO TABS: 40.0000 mg | ORAL_TABLET | Freq: Every day | ORAL | 5 refills | Status: AC

## 2024-02-25 MED ORDER — PREDNISONE 10 MG PO TABS: ORAL_TABLET | ORAL | 0 refills | Status: AC

## 2024-02-25 MED ORDER — PROMETHAZINE-DM 6.25-15 MG/5ML PO SYRP: 2.5000 mL | ORAL_SOLUTION | Freq: Four times a day (QID) | ORAL | 0 refills | Status: AC | PRN

## 2024-02-25 MED ORDER — LEVALBUTEROL HCL 0.63 MG/3ML IN NEBU: 0.6300 mg | INHALATION_SOLUTION | Freq: Three times a day (TID) | RESPIRATORY_TRACT | 2 refills | Status: AC | PRN

## 2024-02-25 MED ORDER — TIRZEPATIDE 12.5 MG/0.5ML ~~LOC~~ SOAJ: 12.5000 mg | SUBCUTANEOUS | 2 refills | Status: AC

## 2024-02-25 NOTE — Progress Notes (Signed)
 Patient ID: Charles Mccann, male    DOB: [birth year]  Age: 74 y.o. MRN: [id number]  The patient is here for annual preventive examination and management of other chronic and acute problems.   The risk factors are reflected in the social history.   The roster of all physicians providing medical care to patient - is listed in the Snapshot section of the chart.   Activities of daily living:  The patient is 100% independent in all ADLs: dressing, toileting, feeding as well as independent mobility   Home safety : The patient has smoke detectors in the home. They wear seatbelts.  There are no unsecured firearms at home. There is no violence in the home.    There is no risks for hepatitis, STDs or HIV. There is no   history of blood transfusion. They have no travel history to infectious disease endemic areas of the world.   The patient has seen their dentist in the last six month. They have seen their eye doctor in the last year. The patinet  denies slight hearing difficulty with regard to whispered voices and some television programs.  They have deferred audiologic testing in the last year.  They do not  have excessive sun exposure. Discussed the need for sun protection: hats, long sleeves and use of sunscreen if there is significant sun exposure.    Diet: the importance of a healthy diet is discussed. They do have a healthy diet.   The benefits of regular aerobic exercise were discussed. The patient  is not exercising currently  and has gained weight    Depression screen: there are no signs or vegative symptoms of depression- irritability, change in appetite, anhedonia, sadness/tearfullness.   The following portions of the patient's history were reviewed and updated as appropriate: allergies, current medications, past family history, past medical history,  past surgical history, past social history  and problem list.   Visual acuity was not assessed per patient preference since the patient has  regular follow up with an  ophthalmologist. Hearing and body mass index were assessed and reviewed.    During the course of the visit the patient was educated and counseled about appropriate screening and preventive services including : fall prevention , diabetes screening, nutrition counseling, colorectal cancer screening, and recommended immunizations.    Chief Complaint:   1) persistent SOB Secondary  asthma:  treated Dec 224 with z pack and steroids,  saw  Theotis on jan 6 by [facility]  with steroids and doxy and 10 days (2nd rough of abx)  fatigue;  dyspnea a  white sputum . Using xopenex  twice which helps temporarily.  Cough worse in the morning  coughing throughout the day   2) saw Frutoso for allergies: advised  to take zyrtec qid, famotidine ,  and singulair  for recurrent hives.   3) sciatica : right leg. New.  Started after Christmas.  Pain radiates to right ankle.   4) h/o CVA : noted on MRI by Maree   after hospitalization. Patient states that he was told tat dementia was inevitable   5)  Type 2 DM:  weight increasing due to lack of exercise due to orhtopedic issues. Takkng Mounjaro    Review of Symptoms  Patient denies headache, fevers, malaise, unintentional weight loss, skin rash, eye pain, sinus congestion and sinus pain, sore throat, dysphagia,  hemoptysis ,chest pain, palpitations, orthopnea, edema, abdominal pain, nausea, melena, diarrhea, constipation, flank pain, dysuria, hematuria, urinary  Frequency, nocturia, numbness, tingling, seizures,  Focal  weakness, Loss of consciousness,  Tremor, insomnia, depression, anxiety, and suicidal ideation.    Physical Exam:  BP 120/82   Pulse 73   Ht 6' (1.829 m)   Wt 215 lb 3.2 oz (97.6 kg)   SpO2 99%   BMI 29.19 kg/m    Physical Exam Vitals reviewed.  Constitutional:      General: He is not in acute distress.    Appearance: Normal appearance. He is obese. He is not ill-appearing, toxic-appearing or diaphoretic.  HENT:      Head: Normocephalic.  Eyes:     General: No scleral icterus.       Right eye: No discharge.        Left eye: No discharge.     Conjunctiva/sclera: Conjunctivae normal.  Cardiovascular:     Rate and Rhythm: Normal rate and regular rhythm.     Heart sounds: Murmur heard.  Pulmonary:     Effort: Pulmonary effort is normal. No respiratory distress.     Breath sounds: Wheezing present.  Musculoskeletal:        General: Normal range of motion.     Cervical back: Normal range of motion.  Skin:    General: Skin is warm and dry.  Neurological:     General: No focal deficit present.     Mental Status: He is alert and oriented to person, place, and time. Mental status is at baseline.  Psychiatric:        Mood and Affect: Mood normal.        Behavior: Behavior normal.        Thought Content: Thought content normal.        Judgment: Judgment normal.     Assessment and Plan: Encounter for preventative adult health care examination  Obesity, diabetes, and hypertension syndrome (HCC) Assessment & Plan: Diagnosed in October  2023 with A1c of 6.5  , with improved A1c with Mounjaro ,  but his weight  loss has plateaued with 10 mg dose will increase to 12.5 mg weekly. .  Continue follow up with Dr Dingledein    Lab Results  Component Value Date   HGBA1C 5.8 02/25/2024   Lab Results  Component Value Date   MICROALBUR 4.7 (H) 02/25/2024   MICROALBUR 1.5 08/21/2023       Orders: -     Comprehensive metabolic panel with GFR -     Hemoglobin A1c -     Microalbumin / creatinine urine ratio  Hyperlipidemia with target LDL less than 70 Assessment & Plan: LDL is now at goal on 40 mg atorvastatin   Lab Results  Component Value Date   CHOL 97 02/25/2024   HDL 34.60 (L) 02/25/2024   LDLCALC 47 02/25/2024   LDLDIRECT 51.0 02/25/2024   TRIG 78.0 02/25/2024   CHOLHDL 3 02/25/2024     Orders: -     Lipid panel -     LDL cholesterol, direct  B12 deficiency Assessment &  Plan: Taking chewables since diagnosis,  with more than adequate restoration of levels   Lab Results  Component Value Date   VITAMINB12 >1500 (H) 02/25/2024     Orders: -     CBC with Differential/Platelet -     B12 and Folate Panel -     Intrinsic Factor Antibodies; Future  Prostate cancer screening -     PSA, Medicare  Long-term use of high-risk medication -     TSH  Tubular adenoma of colon -     DG Chest 2  View; Future  Persistent cough for 3 weeks or longer Assessment & Plan: Persistent cough despite 2 rounds of antibiotics.  He has mild wheezing on exam without egophon.  Chswt x ay ordered . Prednisone   refilled   Orders: -     Ambulatory referral to Gastroenterology  Cerebrovascular disease, arteriosclerotic, post-stroke Assessment & Plan: He has been disheartened and frustrated by Dr Jamal prognosis of dementia. And lack of follow up.  He was referred to Dr Jamal website ([email or web address]) but has deferred . Reviewed Dr Jamal note, no signs of Alzhemiers Dementia, and patient has follow up in April with neurology PA. Recommending Cheryl Huntington book addressing need for balance of diet sleep and exercise   Hives Assessment & Plan: Symptoms have been occurring nocturnally  for the past 4 months and lhave involved chest wall as well..  may be precipitated by hot showers at hight.  Referral to allergy specialist has been done,  no changes to regimen of zyrtec  4 times daily,  famotidine  20 mg bid and singulair ,  advised to  Follow up with Stephane Doughty for skin biopsy   Obstructive sleep apnea of adult Assessment & Plan: Managed with CPAP by pulmonology   Encounter for general adult medical examination with abnormal findings Assessment & Plan: age appropriate education and counseling updated, referrals for preventative services and immunizations addressed, dietary and smoking counseling addressed, most recent labs reviewed.  I have personally reviewed and have  noted:   1) the patient's medical and social history 2) The pt's use of alcohol, tobacco, and illicit drugs 3) The patient's current medications and supplements 4) Functional ability including ADL's, fall risk, home safety risk, hearing and visual impairment 5) Diet and physical activities 6) Evidence for depression or mood disorder 7) The patient's height, weight, and BMI have been recorded in the chart.     I have made referrals, and provided counseling and education based on review of the above    Sciatica of right side Assessment & Plan: New onset.  Reevalaute after prednisone  taper    Type 2 diabetes mellitus with diabetic microalbuminuria, without long-term current use of insulin (HCC) Assessment & Plan: Diabetes is well controlled  with Mounjaro  and LDL is at gaol with lipitor, but his Uacr is now > 30 despite use of ARB.  Will confirm compliance and add GSLT 2 inhibitor if indicated. SABRAl   Other orders -     Atorvastatin  Calcium ; Take 1 tablet (40 mg total) by mouth daily.  Dispense: 30 tablet; Refill: 5 -     predniSONE ; 6 tablets daily for 3 days, then reduce by 1 tablet daily until gone  Dispense: 33 tablet; Refill: 0 -     Promethazine -DM; Take 2.5 mLs by mouth 4 (four) times daily as needed for cough.  Dispense: 118 mL; Refill: 0 -     Tirzepatide ; Inject 12.5 mg into the skin once a week.  Dispense: 6 mL; Refill: 2 -     Levalbuterol  HCl; Take 3 mLs (0.63 mg total) by nebulization every 8 (eight) hours as needed for wheezing.  Dispense: 60 mL; Refill: 2    Return in about 6 months (around 08/24/2024).  Verneita LITTIE Kettering, MD

## 2024-02-25 NOTE — Assessment & Plan Note (Signed)
 Diagnosed in October  2023 with A1c of 6.5  , with improved A1c with Mounjaro ,  but his weight  loss has plateaued with 10 mg dose will increase to 12.5 mg weekly. .  Continue follow up with Dr Iva    Lab Results  Component Value Date   HGBA1C 5.8 02/25/2024   Lab Results  Component Value Date   MICROALBUR 4.7 (H) 02/25/2024   MICROALBUR 1.5 08/21/2023

## 2024-02-25 NOTE — Assessment & Plan Note (Signed)
 New onset.  Reevalaute after prednisone  taper

## 2024-02-25 NOTE — Assessment & Plan Note (Addendum)
 He has been disheartened and frustrated by Dr Jamal prognosis of dementia. And lack of follow up.  He was referred to Dr Jamal website ([email or web address]) but has deferred . Reviewed Dr Jamal note, no signs of Alzhemiers Dementia, and patient has follow up in April with neurology PA. Recommending Cheryl Huntington book addressing need for balance of diet sleep and exercise

## 2024-02-25 NOTE — Assessment & Plan Note (Addendum)
 Taking chewables since diagnosis,  with more than adequate restoration of levels   Lab Results  Component Value Date   VITAMINB12 >1500 (H) 02/25/2024

## 2024-02-25 NOTE — Assessment & Plan Note (Signed)

## 2024-02-25 NOTE — Assessment & Plan Note (Signed)
Managed with CPAP by pulmonology ?

## 2024-02-25 NOTE — Patient Instructions (Addendum)
 This book is helpful for ANY one AT RISK FOR DEMENTIA :  I highly recommend reading the End of Alzheimer's: The First Program to Prevent and Reverse Cognitive Decline  by Cheryl Cary, MD  2) PREDNISONE   REPEAT WITH AN  EXTENDED TAPER.  AND CHEST X RAY TODAY    3) MOUNJAOR DOSE INCREASED TO 12.5 MG

## 2024-02-25 NOTE — Assessment & Plan Note (Signed)
 Symptoms have been occurring nocturnally  for the past 4 months and lhave involved chest wall as well..  may be precipitated by hot showers at hight.  Referral to allergy specialist has been done,  no changes to regimen of zyrtec  4 times daily,  famotidine  20 mg bid and singulair ,  advised to  Follow up with Charles Mccann for skin biopsy

## 2024-02-25 NOTE — Assessment & Plan Note (Signed)
 LDL is now at goal on 40 mg atorvastatin   Lab Results  Component Value Date   CHOL 97 02/25/2024   HDL 34.60 (L) 02/25/2024   LDLCALC 47 02/25/2024   LDLDIRECT 51.0 02/25/2024   TRIG 78.0 02/25/2024   CHOLHDL 3 02/25/2024

## 2024-02-25 NOTE — Assessment & Plan Note (Signed)
 Persistent cough despite 2 rounds of antibiotics.  He has mild wheezing on exam without egophon.  Chswt x ay ordered . Prednisone   refilled

## 2024-02-26 ENCOUNTER — Other Ambulatory Visit: Payer: Self-pay | Admitting: Internal Medicine

## 2024-02-27 ENCOUNTER — Other Ambulatory Visit: Payer: Self-pay

## 2024-02-27 ENCOUNTER — Encounter: Payer: Self-pay | Admitting: Emergency Medicine

## 2024-02-27 ENCOUNTER — Emergency Department
Admission: EM | Admit: 2024-02-27 | Discharge: 2024-02-27 | Disposition: A | Discharge status: Home / Self Care | Source: Ambulatory Visit

## 2024-02-27 ENCOUNTER — Other Ambulatory Visit: Payer: Self-pay | Admitting: Internal Medicine

## 2024-02-27 ENCOUNTER — Ambulatory Visit: Payer: Self-pay | Admitting: Internal Medicine

## 2024-02-27 ENCOUNTER — Encounter: Payer: Self-pay | Admitting: Internal Medicine

## 2024-02-27 DIAGNOSIS — T7840XA Allergy, unspecified, initial encounter: Secondary | ICD-10-CM | POA: Insufficient documentation

## 2024-02-27 DIAGNOSIS — J4489 Other specified chronic obstructive pulmonary disease: Secondary | ICD-10-CM | POA: Diagnosis not present

## 2024-02-27 DIAGNOSIS — I10 Essential (primary) hypertension: Secondary | ICD-10-CM | POA: Diagnosis not present

## 2024-02-27 DIAGNOSIS — E1129 Type 2 diabetes mellitus with other diabetic kidney complication: Secondary | ICD-10-CM | POA: Insufficient documentation

## 2024-02-27 MED ORDER — LORATADINE 10 MG PO TABS
10.0000 mg | ORAL_TABLET | Freq: Every day | ORAL | Status: DC
  Administered 2024-02-27: 10 mg via ORAL
  Filled 2024-02-27: qty 1

## 2024-02-27 MED ORDER — METHYLPREDNISOLONE SODIUM SUCC 40 MG IJ SOLR
40.0000 mg | Freq: Once | INTRAMUSCULAR | Status: AC
  Administered 2024-02-27: 40 mg via INTRAVENOUS
  Filled 2024-02-27: qty 1

## 2024-02-27 NOTE — ED Triage Notes (Signed)
 Reports he woke up today with right eye swelling and tongue swelling. Pt has taken zyrtec, 60 mg prednisone , benadryl  today. Denies SOB.

## 2024-02-27 NOTE — ED Provider Notes (Signed)
 "  [facility] Provider Note    Event Date/Time   First MD Initiated Contact with Patient 02/27/24 1508     (approximate)   History   Allergic Reaction   HPI  Charles Mccann is a 74 y.o. male asthma, COPD, hyperlipidemia, hypertension, presenting to the emergency department complaining of right eye swelling and tongue swelling that he woke up with today.  Patient reports that he has a history of allergic reaction similar to this but he does not know what the allergic reaction is.  He reports that his symptoms have greatly improved throughout the day.  Denies any shortness of breath, throat swelling, or lip swelling.  He reports that he is currently on a prednisone  taper and that he also takes Zyrtec and Benadryl  daily.     Physical Exam   Triage Vital Signs: ED Triage Vitals  Encounter Vitals Group     BP 02/27/24 1355 (!) 146/78     Girls Systolic BP Percentile --      Girls Diastolic BP Percentile --      Boys Systolic BP Percentile --      Boys Diastolic BP Percentile --      Pulse Rate 02/27/24 1355 76     Resp 02/27/24 1355 18     Temp 02/27/24 1356 98.5 F (36.9 C)     Temp src --      SpO2 02/27/24 1355 100 %     Weight 02/27/24 1355 215 lb 2.7 oz (97.6 kg)     Height 02/27/24 1355 6' (1.829 m)     Head Circumference --      Peak Flow --      Pain Score --      Pain Loc --      Pain Education --      Exclude from Growth Chart --     Most recent vital signs: Vitals:   02/27/24 1355 02/27/24 1356  BP: (!) 146/78   Pulse: 76   Resp: 18   Temp:  98.5 F (36.9 C)  SpO2: 100%      General: Awake, no distress.  CV:  Good peripheral perfusion.  Resp:  Normal effort.  Abd:  No distention.  Other:  Edema to right eyelid with mild erythema, no lip, tongue, or oropharyngeal swelling, no hives   ED Results / Procedures / Treatments   Labs (all labs ordered are listed, but only abnormal results are displayed) Labs Reviewed - No  data to display   EKG     RADIOLOGY     PROCEDURES:  Critical Care performed: No  Procedures   MEDICATIONS ORDERED IN ED: Medications  loratadine  (CLARITIN ) tablet 10 mg (has no administration in time range)  methylPREDNISolone  sodium succinate (SOLU-MEDROL ) 40 mg/mL injection 40 mg (has no administration in time range)     IMPRESSION / MDM / ASSESSMENT AND PLAN / ED COURSE  I reviewed the triage vital signs and the nursing notes.                              Differential diagnosis includes, but is not limited to, allergic reaction, angioedema  Patient's presentation is most consistent with acute presentation with potential threat to life or bodily function.  Patient is a 74 year old male with past medical history of COPD, asthma, hypertension, hyperlipidemia, presenting to the emergency department for an allergic reaction.  The patient reports that he  frequently has allergic reactions.  He states that he is currently on a prednisone  taper for an asthma exacerbation and that he takes Zyrtec and Benadryl  daily.  He reports that his symptoms have greatly improved and that he wishes to be discharged at this time.  He reports that he knows what symptoms to look for and when to return to the emergency department.  Discussed with the patient that I will provide him with his home dose of Zyrtec at this time as he was requesting it as well as a dose of steroids.  He will continue his prednisone  taper as prescribed and take Benadryl /Zyrtec as needed for symptoms.  Follow-up with his primary care physician within the next few days.      FINAL CLINICAL IMPRESSION(S) / ED DIAGNOSES   Final diagnoses:  Allergic reaction, initial encounter     Rx / DC Orders   ED Discharge Orders     None        Note:  This document was prepared using Dragon voice recognition software and may include unintentional dictation errors.   Rexford Reche HERO, MD 02/27/24 1531  "

## 2024-02-27 NOTE — Discharge Instructions (Signed)
 Please continue your steroid taper as prescribed.  Please take Benadryl /Zyrtec as needed for symptoms.  Follow-up with your primary care physician within the next few days.  Return immediately to the emergency department for any new or worsening symptoms.

## 2024-02-27 NOTE — Assessment & Plan Note (Signed)
 Diabetes is well controlled  with Mounjaro  and LDL is at gaol with lipitor, but his Uacr is now > 30 despite use of ARB.  Will confirm compliance and add GSLT 2 inhibitor if indicated. SABRAl

## 2024-02-27 NOTE — ED Notes (Signed)
 Pt verbalized understanding of discharge instructions. Opportunity for questions provided.

## 2024-02-27 NOTE — ED Notes (Signed)
 ED Provider at bedside.

## 2024-03-03 ENCOUNTER — Other Ambulatory Visit (HOSPITAL_COMMUNITY): Payer: Self-pay

## 2024-03-03 ENCOUNTER — Other Ambulatory Visit: Payer: Self-pay | Admitting: Internal Medicine

## 2024-03-14 ENCOUNTER — Other Ambulatory Visit: Payer: Self-pay | Admitting: Internal Medicine

## 2024-06-10 ENCOUNTER — Ambulatory Visit: Admit: 2024-06-10 | Admitting: Gastroenterology

## 2024-06-10 SURGERY — COLONOSCOPY
Anesthesia: General

## 2024-08-27 ENCOUNTER — Ambulatory Visit: Admitting: Internal Medicine

## 2024-10-29 ENCOUNTER — Ambulatory Visit
